# Patient Record
Sex: Male | Born: 1940 | Race: White | Hispanic: No | Marital: Married | Smoking: Never smoker
Health system: Southern US, Community
[De-identification: ages and names within clinical notes are randomized; demographics above are authoritative.]

## PROBLEM LIST (undated history)

## (undated) DIAGNOSIS — R609 Edema, unspecified: Secondary | ICD-10-CM

## (undated) DIAGNOSIS — K219 Gastro-esophageal reflux disease without esophagitis: Secondary | ICD-10-CM

## (undated) DIAGNOSIS — N400 Enlarged prostate without lower urinary tract symptoms: Secondary | ICD-10-CM

## (undated) DIAGNOSIS — R251 Tremor, unspecified: Secondary | ICD-10-CM

## (undated) DIAGNOSIS — E785 Hyperlipidemia, unspecified: Secondary | ICD-10-CM

## (undated) DIAGNOSIS — I10 Essential (primary) hypertension: Secondary | ICD-10-CM

## (undated) DIAGNOSIS — G20 Parkinson's disease: Secondary | ICD-10-CM

## (undated) DIAGNOSIS — T7840XA Allergy, unspecified, initial encounter: Secondary | ICD-10-CM

## (undated) DIAGNOSIS — G20A1 Parkinson's disease without dyskinesia, without mention of fluctuations: Secondary | ICD-10-CM

## (undated) DIAGNOSIS — N200 Calculus of kidney: Secondary | ICD-10-CM

## (undated) HISTORY — DX: Calculus of kidney: N20.0

## (undated) HISTORY — DX: Benign prostatic hyperplasia without lower urinary tract symptoms: N40.0

## (undated) HISTORY — DX: Gastro-esophageal reflux disease without esophagitis: K21.9

## (undated) HISTORY — DX: Allergy, unspecified, initial encounter: T78.40XA

## (undated) HISTORY — DX: Essential (primary) hypertension: I10

## (undated) HISTORY — DX: Tremor, unspecified: R25.1

## (undated) HISTORY — DX: Hyperlipidemia, unspecified: E78.5

## (undated) HISTORY — PX: TRANSURETHRAL RESECTION OF PROSTATE: SHX73

---

## 1998-01-04 HISTORY — PX: ESOPHAGOGASTRODUODENOSCOPY: SHX1529

## 1998-01-18 ENCOUNTER — Other Ambulatory Visit: Admission: RE | Admit: 1998-01-18 | Discharge: 1998-01-18 | Payer: Self-pay | Admitting: Gastroenterology

## 1998-02-04 HISTORY — PX: PROSTATE SURGERY: SHX751

## 1998-05-06 ENCOUNTER — Encounter: Payer: Self-pay | Admitting: Family Medicine

## 1998-05-06 LAB — CONVERTED CEMR LAB: PSA: 2.6 ng/mL

## 1999-05-06 ENCOUNTER — Encounter: Payer: Self-pay | Admitting: Family Medicine

## 2001-01-26 HISTORY — PX: CYSTOSCOPY TUMOR / CONDYLOMATA W/ LASER: SUR373

## 2001-06-23 HISTORY — PX: OTHER SURGICAL HISTORY: SHX169

## 2002-02-04 ENCOUNTER — Encounter: Payer: Self-pay | Admitting: Family Medicine

## 2002-02-04 LAB — CONVERTED CEMR LAB: PSA: 1.4 ng/mL

## 2003-02-05 ENCOUNTER — Encounter: Payer: Self-pay | Admitting: Family Medicine

## 2003-02-05 LAB — CONVERTED CEMR LAB: PSA: 0.8 ng/mL

## 2003-10-06 HISTORY — PX: LITHOTRIPSY: SUR834

## 2003-10-17 ENCOUNTER — Other Ambulatory Visit: Payer: Self-pay

## 2003-12-21 ENCOUNTER — Ambulatory Visit: Payer: Self-pay | Admitting: Family Medicine

## 2004-04-04 ENCOUNTER — Encounter: Payer: Self-pay | Admitting: Family Medicine

## 2004-04-04 LAB — CONVERTED CEMR LAB: PSA: 0.92 ng/mL

## 2004-04-05 ENCOUNTER — Ambulatory Visit: Payer: Self-pay | Admitting: Family Medicine

## 2004-04-09 ENCOUNTER — Ambulatory Visit: Payer: Self-pay | Admitting: Family Medicine

## 2004-04-24 ENCOUNTER — Ambulatory Visit: Payer: Self-pay | Admitting: Family Medicine

## 2004-09-04 ENCOUNTER — Encounter: Payer: Self-pay | Admitting: Family Medicine

## 2004-10-10 ENCOUNTER — Ambulatory Visit: Payer: Self-pay | Admitting: Family Medicine

## 2004-11-20 ENCOUNTER — Ambulatory Visit: Payer: Self-pay | Admitting: Family Medicine

## 2005-04-04 ENCOUNTER — Ambulatory Visit: Payer: Self-pay | Admitting: Family Medicine

## 2005-04-09 ENCOUNTER — Ambulatory Visit: Payer: Self-pay | Admitting: Family Medicine

## 2005-04-25 ENCOUNTER — Ambulatory Visit: Payer: Self-pay | Admitting: Family Medicine

## 2005-10-11 ENCOUNTER — Ambulatory Visit: Payer: Self-pay | Admitting: Family Medicine

## 2005-11-27 ENCOUNTER — Ambulatory Visit: Payer: Self-pay | Admitting: Family Medicine

## 2006-04-11 ENCOUNTER — Ambulatory Visit: Payer: Self-pay | Admitting: Family Medicine

## 2006-04-11 LAB — CONVERTED CEMR LAB
ALT: 28 units/L (ref 0–40)
AST: 24 units/L (ref 0–37)
Alkaline Phosphatase: 91 units/L (ref 39–117)
BUN: 19 mg/dL (ref 6–23)
Bilirubin, Direct: 0.2 mg/dL (ref 0.0–0.3)
CO2: 29 meq/L (ref 19–32)
Calcium: 9.9 mg/dL (ref 8.4–10.5)
Chloride: 105 meq/L (ref 96–112)
Cholesterol: 165 mg/dL (ref 0–200)
Creatinine, Ser: 1.1 mg/dL (ref 0.4–1.5)
Glucose, Bld: 108 mg/dL — ABNORMAL HIGH (ref 70–99)
Total Bilirubin: 0.7 mg/dL (ref 0.3–1.2)
Total Protein: 6.6 g/dL (ref 6.0–8.3)

## 2006-04-24 ENCOUNTER — Ambulatory Visit: Payer: Self-pay | Admitting: Family Medicine

## 2006-05-19 ENCOUNTER — Ambulatory Visit: Payer: Self-pay | Admitting: Family Medicine

## 2006-06-05 ENCOUNTER — Ambulatory Visit: Payer: Self-pay | Admitting: Family Medicine

## 2006-06-05 LAB — CONVERTED CEMR LAB
ALT: 27 units/L (ref 0–40)
Albumin: 4.2 g/dL (ref 3.5–5.2)
Alkaline Phosphatase: 78 units/L (ref 39–117)
Bilirubin, Direct: 0.1 mg/dL (ref 0.0–0.3)
HDL: 39.8 mg/dL (ref >39.0)
LDL Cholesterol: 79 mg/dL (ref 0–99)
VLDL: 28 mg/dL (ref 0–40)

## 2006-07-23 ENCOUNTER — Ambulatory Visit: Payer: Self-pay | Admitting: Family Medicine

## 2006-07-23 DIAGNOSIS — K208 Other esophagitis: Secondary | ICD-10-CM

## 2006-07-23 DIAGNOSIS — J309 Allergic rhinitis, unspecified: Secondary | ICD-10-CM

## 2006-07-23 DIAGNOSIS — I10 Essential (primary) hypertension: Secondary | ICD-10-CM | POA: Insufficient documentation

## 2006-07-23 DIAGNOSIS — K219 Gastro-esophageal reflux disease without esophagitis: Secondary | ICD-10-CM | POA: Insufficient documentation

## 2006-07-23 DIAGNOSIS — N200 Calculus of kidney: Secondary | ICD-10-CM | POA: Insufficient documentation

## 2006-07-23 DIAGNOSIS — N400 Enlarged prostate without lower urinary tract symptoms: Secondary | ICD-10-CM | POA: Insufficient documentation

## 2006-07-23 LAB — CONVERTED CEMR LAB
ALT: 23 units/L (ref 0–40)
AST: 22 units/L (ref 0–37)
Total CHOL/HDL Ratio: 3.6

## 2006-07-25 ENCOUNTER — Ambulatory Visit: Payer: Self-pay | Admitting: Family Medicine

## 2006-11-28 ENCOUNTER — Ambulatory Visit: Payer: Self-pay | Admitting: Family Medicine

## 2007-02-25 ENCOUNTER — Ambulatory Visit: Payer: Self-pay | Admitting: Family Medicine

## 2007-02-25 ENCOUNTER — Telehealth (INDEPENDENT_AMBULATORY_CARE_PROVIDER_SITE_OTHER): Payer: Self-pay | Admitting: Internal Medicine

## 2007-02-26 ENCOUNTER — Telehealth (INDEPENDENT_AMBULATORY_CARE_PROVIDER_SITE_OTHER): Payer: Self-pay | Admitting: Internal Medicine

## 2007-05-29 ENCOUNTER — Telehealth: Payer: Self-pay | Admitting: Family Medicine

## 2007-08-18 ENCOUNTER — Ambulatory Visit: Payer: Self-pay | Admitting: Family Medicine

## 2007-08-18 LAB — CONVERTED CEMR LAB
ALT: 24 units/L (ref 0–53)
Alkaline Phosphatase: 77 units/L (ref 39–117)
Bilirubin, Direct: 0.1 mg/dL (ref 0.0–0.3)
CO2: 27 meq/L (ref 19–32)
Cholesterol: 108 mg/dL (ref 0–200)
Glucose, Bld: 116 mg/dL — ABNORMAL HIGH (ref 70–99)
HDL: 34.9 mg/dL — ABNORMAL LOW (ref >39.0)
Potassium: 4 meq/L (ref 3.5–5.1)
Sodium: 140 meq/L (ref 135–145)
Total Protein: 6.6 g/dL (ref 6.0–8.3)

## 2007-08-20 ENCOUNTER — Ambulatory Visit: Payer: Self-pay | Admitting: Family Medicine

## 2007-08-28 ENCOUNTER — Ambulatory Visit: Payer: Self-pay | Admitting: Family Medicine

## 2007-08-28 LAB — CONVERTED CEMR LAB
OCCULT 1: NEGATIVE
OCCULT 2: NEGATIVE
OCCULT 3: NEGATIVE

## 2007-08-31 ENCOUNTER — Encounter (INDEPENDENT_AMBULATORY_CARE_PROVIDER_SITE_OTHER): Payer: Self-pay | Admitting: *Deleted

## 2007-09-11 ENCOUNTER — Telehealth (INDEPENDENT_AMBULATORY_CARE_PROVIDER_SITE_OTHER): Payer: Self-pay | Admitting: Internal Medicine

## 2007-09-19 ENCOUNTER — Telehealth: Payer: Self-pay | Admitting: Family Medicine

## 2007-11-09 ENCOUNTER — Ambulatory Visit: Payer: Self-pay | Admitting: Family Medicine

## 2008-05-23 ENCOUNTER — Telehealth: Payer: Self-pay | Admitting: Family Medicine

## 2008-05-24 ENCOUNTER — Telehealth: Payer: Self-pay | Admitting: Family Medicine

## 2008-05-24 ENCOUNTER — Encounter: Payer: Self-pay | Admitting: Family Medicine

## 2008-06-08 ENCOUNTER — Telehealth: Payer: Self-pay | Admitting: Family Medicine

## 2008-07-12 ENCOUNTER — Telehealth: Payer: Self-pay | Admitting: Family Medicine

## 2008-08-22 ENCOUNTER — Ambulatory Visit: Payer: Self-pay | Admitting: Family Medicine

## 2008-08-22 LAB — CONVERTED CEMR LAB
BUN: 19 mg/dL (ref 6–23)
Basophils Relative: 0.3 % (ref 0.0–3.0)
Bilirubin, Direct: 0.2 mg/dL (ref 0.0–0.3)
CO2: 31 meq/L (ref 19–32)
Chloride: 105 meq/L (ref 96–112)
Cholesterol: 129 mg/dL (ref 0–200)
Creatinine, Ser: 1.1 mg/dL (ref 0.4–1.5)
Creatinine,U: 130.3 mg/dL
Eosinophils Absolute: 0.4 10*3/uL (ref 0.0–0.7)
MCHC: 34.5 g/dL (ref 30.0–36.0)
MCV: 95.2 fL (ref 78.0–100.0)
Microalb, Ur: 1 mg/dL (ref 0.0–1.9)
Monocytes Absolute: 0.5 10*3/uL (ref 0.1–1.0)
Neutrophils Relative %: 49.2 % (ref 43.0–77.0)
Platelets: 194 10*3/uL (ref 150.0–400.0)
RBC: 4.42 M/uL (ref 4.22–5.81)
TSH: 4.58 microintl units/mL (ref 0.35–5.50)
Total Protein: 7.3 g/dL (ref 6.0–8.3)
Triglycerides: 89 mg/dL (ref 0.0–149.0)

## 2008-08-23 LAB — CONVERTED CEMR LAB: Vit D, 25-Hydroxy: 28 ng/mL — ABNORMAL LOW (ref 30–89)

## 2008-08-24 ENCOUNTER — Ambulatory Visit: Payer: Self-pay | Admitting: Family Medicine

## 2008-08-24 DIAGNOSIS — E559 Vitamin D deficiency, unspecified: Secondary | ICD-10-CM | POA: Insufficient documentation

## 2008-09-09 ENCOUNTER — Ambulatory Visit: Payer: Self-pay | Admitting: Family Medicine

## 2008-09-09 LAB — FECAL OCCULT BLOOD, GUAIAC: Fecal Occult Blood: NEGATIVE

## 2008-09-12 ENCOUNTER — Encounter (INDEPENDENT_AMBULATORY_CARE_PROVIDER_SITE_OTHER): Payer: Self-pay | Admitting: *Deleted

## 2008-11-17 ENCOUNTER — Ambulatory Visit: Payer: Self-pay | Admitting: Family Medicine

## 2008-12-22 ENCOUNTER — Ambulatory Visit: Payer: Self-pay | Admitting: Family Medicine

## 2008-12-22 LAB — CONVERTED CEMR LAB
ALT: 21 units/L (ref 0–53)
AST: 23 units/L (ref 0–37)
Cholesterol: 140 mg/dL (ref 0–200)
HDL: 41.9 mg/dL (ref >39.00)
Total CHOL/HDL Ratio: 3
Triglycerides: 81 mg/dL (ref 0.0–149.0)

## 2008-12-27 ENCOUNTER — Ambulatory Visit: Payer: Self-pay | Admitting: Family Medicine

## 2009-05-30 ENCOUNTER — Ambulatory Visit: Payer: Self-pay | Admitting: Family Medicine

## 2009-06-19 ENCOUNTER — Telehealth: Payer: Self-pay | Admitting: Family Medicine

## 2009-06-26 ENCOUNTER — Telehealth: Payer: Self-pay | Admitting: Family Medicine

## 2009-07-05 ENCOUNTER — Encounter: Payer: Self-pay | Admitting: Family Medicine

## 2009-07-05 ENCOUNTER — Telehealth (INDEPENDENT_AMBULATORY_CARE_PROVIDER_SITE_OTHER): Payer: Self-pay | Admitting: *Deleted

## 2009-09-07 ENCOUNTER — Encounter: Payer: Self-pay | Admitting: Family Medicine

## 2009-09-07 ENCOUNTER — Telehealth (INDEPENDENT_AMBULATORY_CARE_PROVIDER_SITE_OTHER): Payer: Self-pay | Admitting: *Deleted

## 2009-09-07 ENCOUNTER — Ambulatory Visit: Payer: Self-pay | Admitting: Family Medicine

## 2009-09-07 LAB — CONVERTED CEMR LAB
Albumin: 4.3 g/dL (ref 3.5–5.2)
BUN: 20 mg/dL (ref 6–23)
CO2: 29 meq/L (ref 19–32)
Calcium: 9.8 mg/dL (ref 8.4–10.5)
Creatinine, Ser: 1.1 mg/dL (ref 0.4–1.5)
Glucose, Bld: 108 mg/dL — ABNORMAL HIGH (ref 70–99)
HDL: 45.4 mg/dL (ref >39.00)
Total Protein: 6.9 g/dL (ref 6.0–8.3)
Triglycerides: 97 mg/dL (ref 0.0–149.0)

## 2009-09-08 ENCOUNTER — Encounter: Payer: Self-pay | Admitting: Family Medicine

## 2009-09-11 ENCOUNTER — Encounter: Payer: Self-pay | Admitting: Family Medicine

## 2009-09-11 ENCOUNTER — Encounter (INDEPENDENT_AMBULATORY_CARE_PROVIDER_SITE_OTHER): Payer: Self-pay | Admitting: *Deleted

## 2009-09-11 ENCOUNTER — Ambulatory Visit: Payer: Self-pay | Admitting: Family Medicine

## 2009-09-14 ENCOUNTER — Encounter: Payer: Self-pay | Admitting: Family Medicine

## 2009-09-14 ENCOUNTER — Telehealth: Payer: Self-pay | Admitting: Family Medicine

## 2009-09-18 ENCOUNTER — Ambulatory Visit: Payer: Self-pay | Admitting: Family Medicine

## 2009-09-20 ENCOUNTER — Encounter (INDEPENDENT_AMBULATORY_CARE_PROVIDER_SITE_OTHER): Payer: Self-pay | Admitting: *Deleted

## 2009-09-20 LAB — CONVERTED CEMR LAB: Fecal Occult Bld: NEGATIVE

## 2009-10-31 ENCOUNTER — Ambulatory Visit: Payer: Self-pay | Admitting: Family Medicine

## 2009-11-21 ENCOUNTER — Encounter: Payer: Self-pay | Admitting: Family Medicine

## 2009-12-20 ENCOUNTER — Telehealth: Payer: Self-pay | Admitting: Family Medicine

## 2009-12-21 ENCOUNTER — Encounter: Payer: Self-pay | Admitting: Family Medicine

## 2010-03-06 NOTE — Medication Information (Signed)
Summary: [employer] Prior Auth Approval for Omeprazole Capsule Dr from 06/14/09  [employer] Prior Auth Approval for Omeprazole Capsule Dr from 06/14/09-01/01/10   Imported By: Beau Fanny 07/07/2009 14:00:30  _____________________________________________________________________  External Attachment:    Type:   Image     Comment:   External Document

## 2010-03-06 NOTE — Letter (Signed)
Summary: [facility]   Imported By: Maryln Gottron 12/06/2009 12:29:46  _____________________________________________________________________  External Attachment:    Type:   Image     Comment:   External Document  Appended Document: [facility]    Clinical Lists Changes  Observations: Added new observation of PAST MED HX: Allergic rhinitis Hypertension GERD tremor HLD BPH, no h/o prostate CA- followed by Dr. Evelene Croon (12/06/2009 13:12)       Past History:  Past Medical History: Allergic rhinitis Hypertension GERD tremor HLD BPH, no h/o prostate CA- followed by Dr. Evelene Croon

## 2010-03-06 NOTE — Progress Notes (Signed)
Summary: Prior authorization for Prilosec 40 mg.  Phone Note Call from Patient Call back at Home Phone [phone]   Caller: Patient Call For: Shaune Leeks MD Summary of Call: Patient states that he has been informed that his Prilosec 40 mg. requires a prior authorization.  He says he was given this number for [place] to call.  [phone].  I will call for the proper forms to be faxed. Initial call taken by: Delilah Shan CMA Duncan Dull),  Jun 26, 2009 10:20 AM  Follow-up for Phone Call        Called to request form. Follow-up by: Delilah Shan CMA Duncan Dull),  Jun 27, 2009 12:40 PM

## 2010-03-06 NOTE — Letter (Signed)
Summary: Results Follow up Letter  [place] at [facility]  [address] [place], [place] [address]   Phone: [phone]  Fax: [phone]    09/20/2009 MRN: [id number]    Hunter Willis [address] [place], [place]  [address]    Dear Mr. Corsetti,  The following are the results of your recent test(s):  Test         Result    Pap Smear:        Normal _____  Not Normal _____ Comments: ______________________________________________________ Cholesterol: LDL(Bad cholesterol):         Your goal is less than:         HDL (Good cholesterol):       Your goal is more than: Comments:  ______________________________________________________ Mammogram:        Normal _____  Not Normal _____ Comments:  ___________________________________________________________________ Hemoccult:        Normal __X__  Not normal _______ Comments:    _____________________________________________________________________ Other Tests:    We routinely do not discuss normal results over the telephone.  If you desire a copy of the results, or you have any questions about this information we can discuss them at your next office visit.   Sincerely,    Dwana Curd. Para March, M.D.  [facility]
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
## 2010-03-06 NOTE — Assessment & Plan Note (Signed)
Summary: CPX/TRANSFER FROM DR SCHALLER/CLE   Vital Signs:  Patient profile:   70 year old male Height:      75 inches Weight:      189.25 pounds BMI:     23.74 Temp:     97.9 degrees F oral Pulse rate:   72 / minute Pulse rhythm:   regular BP sitting:   132 / 82  (left arm) Cuff size:   regular  Vitals Entered By: Delilah Shan CMA Duncan Dull) 2009/10/05 11:29 AM) CC: CPX - Transfer from [facility] RN  History of Present Illness: Hypertension:      Using medication without problems or lightheadedness: yes Chest pain with exertion:no Edema:no Short of breath:no Other issues:no  Tremor noted 5 years ago.  On betablocker.  No help with topiramate.  Has had neuro follow up. Taking klonopin three times a day with some relief.  No recent change in symptoms.   Elevated Cholesterol: Using medications without problems:yes Muscle aches: no Other complaints: no  Labs reviewed with patient.  Mild increase in glucose.  No h/o DM in patient but there is a FH.    Screening for prostate CA.  Rectal exam done by Uro.  PSA not elevated.  Current Medications (verified): 1)  Atenolol 25 Mg Tabs (Atenolol) .... Take One By Mouth  Two Times A Day 2)  Simvastatin 80 Mg Tabs (Simvastatin) .... Take 1 Tablet By Mouth At Bedtime 3)  Amlodipine Besylate 5 Mg Tabs (Amlodipine Besylate) .... Take 1 Tablet By Mouth Once A Day 4)  Prilosec 40 Mg  Cpdr (Omeprazole) .... One Tab By Mouth 45 Mins Before Brfst. 5)  Zyrtec Allergy 10 Mg  Tabs (Cetirizine Hcl) .... Take 1 Tablet By Mouth Once A Day 6)  Fish Oil 1000 Mg Caps (Omega-3 Fatty Acids) .... 2 Capsules Daily By Mouth 7)  Ergocalciferol 50000 Unit Caps (Ergocalciferol) .... Take 1 Capsule By Mouth One Day Per Week For 8 Weeks.  Allergies: No Known Drug Allergies  Past History:  Family History: Last updated: 05-Oct-2009 Father: Died at age 20, unknow causes, did have h/o MI Mother: Died at age 72 with diabetes of natural causes Siblings:Five  brothers, 1 deceased at age 46 with terminal cancer of the prostate             (metastatic disease), 3 brothers with HTN, and 1 other brother has lost his             sense of smell.             Four sisters, 1 with diabetes and syncope with a broken hip, 1 with             depression and 1 who has not had polyps now, one with throat cancer, has improved some but lost a lot of weight..  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 Artis Flock  Past Surgical History: EGD- erosive esoph 12/99 Gerd 12/99 Tuna laser Cystoscopy 01/26/01 EMG, NCV LE's wnl Kemper Durie) 06/23/01 EGD, wnl R/O H.Plyorie, gastritis by bx 09/28/02 Lithotripsy secondary kidney stone Artis Flock) 09/05 Prostatic microwave therapy 10/07   Family History: Reviewed history from 08/24/2008 and no changes required. Father: Died at age 44, unknow causes, did have h/o MI Mother: Died at  age 50 with diabetes of natural causes Siblings:Five brothers, 1 deceased at age 25 with terminal cancer of the prostate             (metastatic disease), 3 brothers with HTN, and 1 other brother has lost his             sense of smell.             Four sisters, 1 with diabetes and syncope with a broken hip, 1 with             depression and 1 who has not had polyps now, one with throat cancer, has improved some but lost a lot of weight..  Social History: Reviewed history from 07/23/2006 and no changes required. 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  Otherwise negative.    Physical Exam  General:  GEN: nad, alert and oriented HEENT:  mucous membranes moist NECK: supple w/o LA CV: regular rate and rhythm  PULM: ctab, no inc wob ABD: soft, +bs EXT: no edema SKIN: no acute rash  No cogwheeling.  L>R tremor, arm > leg tremor.   ~4Hz .  CN 2-12 wnl B, S/S/DTR wnl x4    Impression & Recommendations:  Problem # 1:  UNSPECIFIED VITAMIN D DEFICIENCY (ICD-268.9) continue repletion and return for recheck on lab.   Problem # 2:  HYPERGLYCEMIA, 106 (ICD-790.6) D/w patient.  Avoid sugar foods and check yearly.   Problem # 3:  TREMOR (PHYSIOLOGIC) WILLIS (ICD-781.0) No change in meds.  No chnage in symptoms.  No cogwheeling and no symptoms suggestive of Parkinson's o/w.   Problem # 4:  HYPERTENSION (ICD-401.9) No change in meds.  labs reviwed with patient.  His updated medication list for this problem includes:    Atenolol 25 Mg Tabs (Atenolol) .Marland Kitchen... Take one by mouth  two times a day    Amlodipine Besylate 5 Mg Tabs (Amlodipine besylate) .Marland Kitchen... Take 1 tablet by mouth once a day  Problem # 5:  HYPERCHOLESTEROLEMIA, 243/LDL 168 (ICD-272.0) Controlled.  His updated medication list for this problem includes:    Simvastatin 80 Mg Tabs (Simvastatin) .Marland Kitchen... Take 1 tablet by mouth at bedtime  Problem # 6:  SPECIAL SCREENING MALIG NEOPLASMS OTHER SITES (ICD-V76.49) D/w patient [id number] vs. IFOB.  He elects for IFOB.  Will await return of sample.   Complete Medication List: 1)  Atenolol 25 Mg Tabs (Atenolol) .... Take one by mouth  two times a day 2)  Simvastatin 80 Mg Tabs (Simvastatin) .... Take 1 tablet by mouth at bedtime 3)  Amlodipine Besylate 5 Mg Tabs (Amlodipine besylate) .... Take 1 tablet by mouth once a day 4)  Prilosec 40 Mg Cpdr (Omeprazole) .... One tab by mouth 45 mins before brfst. 5)  Zyrtec Allergy 10 Mg Tabs (Cetirizine hcl) .... Take 1 tablet by mouth once a day 6)  Fish Oil 1000 Mg Caps (Omega-3 fatty acids) .... 2 capsules daily by mouth 7)  Ergocalciferol 50000 Unit Caps (Ergocalciferol) .... Take 1  capsule by mouth one day per week for 8 weeks. 8)  Klonopin 0.5 Mg Tabs (Clonazepam) .Marland Kitchen.. 1 by mouth three times a day for tremor  Other Orders: Pneumococcal Vaccine (11914) Admin 1st Vaccine (78295)  Patient Instructions: 1)  Check with your insurance to see if they will cover the shingles  shot.  Drop of the stool blood sample.  We'll contact you with the results.  Prescriptions: PRILOSEC 40 MG  CPDR (OMEPRAZOLE) one tab by mouth 45 mins before brfst.  #90 x 3   Entered and Authorized by:   [name] MD   Signed by:   [name] MD on 09/11/2009   Method used:   Electronically to        [facility] (retail)             ,          Ph: [phone]       Fax: [phone]   RxID:   [id number] AMLODIPINE BESYLATE 5 MG TABS (AMLODIPINE BESYLATE) Take 1 tablet by mouth once a day  #90 x 4   Entered and Authorized by:   [name] MD   Signed by:   [name] MD on 09/11/2009   Method used:   Electronically to        [facility] (retail)             ,          Ph: [phone]       Fax: [phone]   RxID:   [id number] SIMVASTATIN 80 MG TABS (SIMVASTATIN) Take 1 tablet by mouth at bedtime  #90 x 4   Entered and Authorized by:   [name] MD   Signed by:   [name] MD on 09/11/2009   Method used:   Electronically to        [facility] (retail)             ,          Ph: [phone]       Fax: [phone]   RxID:   [id number] ATENOLOL 25 MG TABS (ATENOLOL) Take one by mouth  two times a day  #180 x 4   Entered and Authorized by:   [name] MD   Signed by:   [name] MD on 09/11/2009   Method used:   Electronically to        [facility]* (retail)             ,          Ph: [phone]       Fax: [phone]   RxID:   [id number]   Current Allergies (reviewed today): No known allergies   Immunizations Administered:  Pneumonia Vaccine:    Vaccine Type: Pneumovax (Medicare)    Site: right deltoid     Mfr: Merck    Dose: 0.5 ml    Route: IM    Given by: Delilah Shan CMA (AAMA)    Exp. Date: 02/21/2011    Lot #: [id number]    VIS given: 09/02/95 version given September 11, 2009.

## 2010-03-06 NOTE — Letter (Signed)
Summary: [facility]: Immunoassay Fecal Occult Blood (iFOB) Order Form  [place] at [facility]  [address] [place], [place] [address]   Phone: [phone]  Fax: [phone]      [facility]: Immunoassay Fecal Occult Blood (iFOB) Order Form   September 11, 2009 MRN: [id number]   Hunter Willis [birth year]   Physicican Name:_________duncan________________  Diagnosis Code:_________v76.49_________________      [name] MD

## 2010-03-06 NOTE — Miscellaneous (Signed)
  Clinical Lists Changes  Medications: Added new medication of ERGOCALCIFEROL 50000 UNIT CAPS (ERGOCALCIFEROL) Take 1 capsule by mouth one day per week for 8 weeks.

## 2010-03-06 NOTE — Medication Information (Signed)
Summary: Interaction with Simvastatin & Amlodipine/[facility]  Interaction with Simvastatin & Amlodipine/[facility]   Imported By: Lanelle Bal 09/21/2009 13:09:11  _____________________________________________________________________  External Attachment:    Type:   Image     Comment:   External Document

## 2010-03-06 NOTE — Medication Information (Signed)
Summary: Prior Authorization & Approval for Additional Quantity Omeprazol  Prior Authorization & Approval for Additional Quantity Omeprazole/[facility]   Imported By: Lanelle Bal 01/04/2010 09:20:43  _____________________________________________________________________  External Attachment:    Type:   Image     Comment:   External Document

## 2010-03-06 NOTE — Progress Notes (Signed)
Summary: prior auth given for prilosec  Phone Note Outgoing Call   Call placed to: [facility] Summary of Call: Prior auth for prilosec approved over the phone from [facility].             Lowella Petties CMA  July 05, 2009 10:03 AM

## 2010-03-06 NOTE — Letter (Signed)
Summary: Nadara Eaton letter  [place] at [facility]  [address] [place], [place] [address]   Phone: [phone]  Fax: [phone]       09/11/2009 MRN: [id number]  Hunter Willis [address] [place], [place]  [address]  Dear Mr. [facility] [place] and [place] announce the retirement of Arta Silence, M.D., from full-time practice at the [facility] office effective August 03, 2009 and his plans of returning part-time.  It is important to Dr. Hetty Ely and to our practice that you understand that [facility] has seven physicians in our office for your health care needs.  We will continue to offer the same exceptional care that you have today.    Dr. Hetty Ely has spoken to many of you about his plans for retirement and returning part-time in the fall.   We will continue to work with you through the transition to schedule appointments for you in the office and meet the high standards that [place] is committed to.   Again, it is with great pleasure that we share the news that Dr. Hetty Ely will return to [facility] in October of 2011 with a reduced schedule.    If you have any questions, or would like to request an appointment with one of our physicians, please call us at [phone] and press the option for Scheduling an appointment.  We take pleasure in providing you with excellent patient care and look forward to seeing you at your next office visit.  Our [facility] Physicians are:  Tillman Abide, M.D. Laurita Quint, M.D. Roxy Manns, M.D. Kerby Nora, M.D. Hannah Beat, M.D. Ruthe Mannan, M.D. We proudly welcomed Raechel Ache, M.D. and Eustaquio Boyden, M.D. to the practice in July/August 2011.  Sincerely,  [place] [facility]

## 2010-03-06 NOTE — Assessment & Plan Note (Signed)
Summary: flu/alc  Nurse Visit   Allergies: No Known Drug Allergies  Immunizations Administered:  Influenza Vaccine # 1:    Vaccine Type: Fluvax MCR    Site: left deltoid    Mfr: GlaxoSmithKline    Dose: 0.5 ml    Route: IM    Given by: Mervin Hack CMA (AAMA)    Exp. Date: 08/04/2010    Lot #: [id number]    VIS given: 08/29/09 version given October 31, 2009.  Flu Vaccine Consent Questions:    Do you have a history of severe allergic reactions to this vaccine? no    Any prior history of allergic reactions to egg and/or gelatin? no    Do you have a sensitivity to the preservative Thimersol? no    Do you have a past history of Guillan-Barre Syndrome? no    Do you currently have an acute febrile illness? no    Have you ever had a severe reaction to latex? no    Vaccine information given and explained to patient? yes  Orders Added: 1)  Influenza Vaccine MCR [00025]

## 2010-03-06 NOTE — Progress Notes (Signed)
Summary: regarding drug interaction  Phone Note From Pharmacy   Caller: [facility] Summary of Call: Letter regarding drug interaction between zocor and amlodipine is on your desk.                Lowella Petties CMA  September 14, 2009 9:50 AM    Follow-up for Phone Call        signed, please send back.  Follow-up by: [name] MD,  September 14, 2009 10:40 PM  Additional Follow-up for Phone Call Additional follow up Details #1::        Faxed. Additional Follow-up by: Delilah Shan CMA Duncan Dull),  September 15, 2009 12:50 PM

## 2010-03-06 NOTE — Progress Notes (Signed)
Summary: prior auth needed for omeprazole  Phone Note From Pharmacy   Caller: [facility] Summary of Call: Prior auth is needed for omeprazole, form is on your desk. Initial call taken by: Lowella Petties CMA, AAMA,  December 20, 2009 10:24 AM  Follow-up for Phone Call        done, in my outbox.  Follow-up by: [name] MD,  December 20, 2009 1:23 PM  Additional Follow-up for Phone Call Additional follow up Details #1::        Form faxed.                Lowella Petties CMA, AAMA  December 20, 2009 3:37 PM  Approval letter placed on doctor's desk for signature and scanning. Additional Follow-up by: Lowella Petties CMA, AAMA,  December 22, 2009 12:10 PM

## 2010-03-06 NOTE — Progress Notes (Signed)
Summary: Medication refill Prilosec  Phone Note Call from Patient   Caller: Patient Call For: Shaune Leeks MD Summary of Call: Request Medication refill thru [facility] for Prolosec 40 mg.Daine Gip  Jun 19, 2009 1:36 PM  Initial call taken by: Daine Gip,  Jun 19, 2009 1:36 PM  Follow-up for Phone Call        Rx sent to pharmacy electronically. Follow-up by: Sydell Axon LPN,  Jun 19, 2009 1:51 PM    Prescriptions: PRILOSEC 40 MG  CPDR (OMEPRAZOLE) one tab by mouth 45 mins before brfst.  #90 x 3   Entered by:   Sydell Axon LPN   Authorized by:   Shaune Leeks MD   Signed by:   Sydell Axon LPN on 16/11/9602   Method used:   Electronically to        [facility]* (mail-order)             ,          Ph: [phone]       Fax: [phone]   RxID:   [id number]

## 2010-03-06 NOTE — Progress Notes (Signed)
----   Converted from flag ---- ---- 09/06/2009 9:05 PM, [name] MD wrote: cmet/lipid 401.1 vit d 268.9 PSA v13.8  ---- 09/06/2009 3:08 PM, Mills Koller wrote: Patient is scheduled for a CPX with you, I need lab orders with DX,  Thanks, Terri ------------------------------

## 2010-03-16 ENCOUNTER — Encounter: Payer: Self-pay | Admitting: Family Medicine

## 2010-03-16 ENCOUNTER — Ambulatory Visit (INDEPENDENT_AMBULATORY_CARE_PROVIDER_SITE_OTHER): Payer: 59 | Admitting: Family Medicine

## 2010-03-16 DIAGNOSIS — H6980 Other specified disorders of Eustachian tube, unspecified ear: Secondary | ICD-10-CM

## 2010-03-16 DIAGNOSIS — E559 Vitamin D deficiency, unspecified: Secondary | ICD-10-CM

## 2010-03-19 ENCOUNTER — Encounter: Payer: Self-pay | Admitting: Family Medicine

## 2010-03-19 LAB — CONVERTED CEMR LAB: Vit D, 25-Hydroxy: 52 ng/mL (ref 30–89)

## 2010-03-28 NOTE — Miscellaneous (Signed)
  Clinical Lists Changes  Medications: Added new medication of VITAMIN D 1000 UNIT  TABS (CHOLECALCIFEROL) Take 1 tablet by mouth once a day

## 2010-03-28 NOTE — Assessment & Plan Note (Signed)
Summary: LEFT EAR/CLE    MEDICARE/UHC   Vital Signs:  Patient profile:   70 year old male Height:      75 inches Weight:      185.25 pounds BMI:     23.24 Temp:     98.3 degrees F oral Pulse rate:   72 / minute Pulse rhythm:   regular BP sitting:   132 / 84  (left arm) Cuff size:   regular  Vitals Entered By: Delilah Shan CMA Roth Ress Dull) (March 16, 2010 2:21 PM) CC: Left ear   History of Present Illness: L ear feels clogged.  Going on for about 1 month.  He can hear pulse in his ear.  No fevers.  Has noted smell changes.  No R ear pain. Some L TMJ area pain, intermittent.  No ST.   resting tremor noted.    He is due for vitamin D recheck.  See notes on labs.   Allergies: No Known Drug Allergies  Past History:  Past Medical History: Last updated: 12/06/2009 Allergic rhinitis Hypertension GERD tremor HLD BPH, no h/o prostate CA- followed by Dr. Evelene Croon  Review of Systems       See HPI.  Otherwise negative.    Physical Exam  General:  GEN: nad, alert and oriented HEENT: mucous membranes moist, tm w/o erythema, nasal exam with some clear rhinorrhea and minimal injection, no OP erythema NECK: supple w/o LA CV: regular rate and rhythm  PULM: ctab, no inc wob EXT: no edema SKIN: no acute rash  resting tremor noted   Impression & Recommendations:  Problem # 1:  UNSPECIFIED VITAMIN D DEFICIENCY (ICD-268.9) See notes on labs.  Orders: T- * Misc. 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  He agrees and will call back as needed.  There is no need to start antibiotics today. He understood.  d/w patient [id number]  Orders: Prescription Created Electronically [id number])  Complete Medication List: 1)  Atenolol 25 Mg Tabs (Atenolol) .... Take one by mouth  two times a day 2)  Simvastatin 40 Mg Tabs (Simvastatin) .Marland Kitchen.. 1 by mouth once daily 3)   Amlodipine Besylate 5 Mg Tabs (Amlodipine besylate) .... Take 1 tablet by mouth once a day 4)  Prilosec 40 Mg Cpdr (Omeprazole) .... One tab by mouth 45 mins before brfst. 5)  Zyrtec Allergy 10 Mg Tabs (Cetirizine hcl) .... Take 1 tablet by mouth once a day 6)  Fish Oil 1000 Mg Caps (Omega-3 fatty acids) .... 2 capsules daily by mouth 7)  Klonopin 0.5 Mg Tabs (Clonazepam) .... (weaning) 1 by mouth three times a day for tremor 8)  Primidone 250 Mg Tabs (Primidone) .... ? mg. 9)  Axiron 30 Mg/act Soln (Testosterone) 10)  Flonase 50 Mcg/act Susp (Fluticasone propionate) .... 2 sprays per nostril per day.  Patient Instructions: 1)  Use the flonase- 2 sprays in each nostril each day and then let me know if you aren't improving.   2)  You can get your results through our phone system.  Follow the instructions on the blue card.  3)  Take care.  Prescriptions: SIMVASTATIN 40 MG TABS (SIMVASTATIN) 1 by mouth once daily  #90 x 3   Entered and Authorized by:   [name] MD   Signed by:   [name] MD on 03/16/2010   Method used:   Electronically to  [facility]* (retail)             ,          Ph: [phone]       Fax: [phone]   RxID:   [id number] FLONASE 50 MCG/ACT SUSP (FLUTICASONE PROPIONATE) 2 sprays per nostril per day.  #1 x 3   Entered and Authorized by:   [name] MD   Signed by:   [name] MD on 03/16/2010   Method used:   Electronically to        [facility]* (retail)       [address]       [place], [place]  [address]       Ph: [phone]       Fax: [phone]   RxID:   [id number]    Orders Added: 1)  Est. Patient Level III [84166] 2)  T- * Misc. Laboratory test [id number] 3)  Prescription Created Electronically [phone]    Current Allergies (reviewed today): No known allergies

## 2010-03-30 ENCOUNTER — Ambulatory Visit (INDEPENDENT_AMBULATORY_CARE_PROVIDER_SITE_OTHER): Payer: 59 | Admitting: Family Medicine

## 2010-03-30 ENCOUNTER — Encounter: Payer: Self-pay | Admitting: Family Medicine

## 2010-03-30 DIAGNOSIS — H6980 Other specified disorders of Eustachian tube, unspecified ear: Secondary | ICD-10-CM

## 2010-04-03 NOTE — Assessment & Plan Note (Signed)
Summary: URI ???   Vital Signs:  Patient profile:   70 year old male Height:      75 inches Weight:      180.75 pounds BMI:     22.67 Temp:     98.6 degrees F oral Pulse rate:   72 / minute Pulse rhythm:   regular BP sitting:   140 / 70  (left arm) Cuff size:   regular  Vitals Entered By: Delilah Shan CMA Ellenore Roscoe Dull) (March 30, 2010 3:15 PM) CC: ? URI   History of Present Illness: Smell changes are some better.  No fevers.  Minimal cough from post nasal gtt.  Has not been on antibiotics.  Prev note reviewed.  Still can feel pulse and extra noise ('an echo') in L ear.  Tremor continues. Still with rhinorrhea.   Allergies: No Known Drug Allergies  Review of Systems       See HPI.  Otherwise negative.    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  He has a persistent L SOM likely related to the ETD.  He may have a concurrent sinusitis.  Since it hasn't resolved yet, I would start the antibiotics.  He'll call back as needed.    Orders: Prescription Created Electronically [phone])  Complete Medication List: 1)  Atenolol 25 Mg Tabs (Atenolol) .... Take one by mouth  two times a day 2)  Simvastatin 40 Mg Tabs (Simvastatin) .Marland Kitchen.. 1 by mouth once daily 3)  Amlodipine Besylate 5 Mg Tabs (Amlodipine besylate) .... Take 1 tablet by mouth once a day 4)  Prilosec 40 Mg Cpdr (Omeprazole) .... One tab by mouth 45 mins before brfst. 5)  Zyrtec Allergy 10 Mg Tabs (Cetirizine hcl) .... Take 1 tablet by mouth once a  day 6)  Fish Oil 1000 Mg Caps (Omega-3 fatty acids) .... 2 capsules daily by mouth 7)  Klonopin 0.5 Mg Tabs (Clonazepam) .... (weaning) 1 by mouth three times a day for tremor 8)  Primidone 250 Mg Tabs (Primidone) .... ? mg. 9)  Axiron 30 Mg/act Soln (Testosterone) 10)  Flonase 50 Mcg/act Susp (Fluticasone propionate) .... 2 sprays per nostril per day. 11)  Vitamin D 1000 Unit Tabs (Cholecalciferol) .... Take 1 tablet by mouth once a day 12)  Amoxicillin 875 Mg Tabs (Amoxicillin) .Marland Kitchen.. 1 by mouth  two times a day 13)  Tessalon 200 Mg Caps (Benzonatate) .Marland Kitchen.. 1 by mouth three times a day as needed for cough  Patient Instructions: 1)  Continue the flonase and start the antibiotics.  Use the tessalon if you need it for cough.  Let me know if you aren't getting better.  We may have to get you to see the ENT clinic.  Take care.  Prescriptions: TESSALON 200 MG CAPS (BENZONATATE) 1 by mouth three times a day as needed for cough  #30 x 0   Entered and Authorized by:   [name] MD   Signed by:   [name] MD on 03/30/2010   Method used:  Print then Give to Patient   RxID:   [id number] AMOXICILLIN 875 MG TABS (AMOXICILLIN) 1 by mouth  two times a day  #20 x 0   Entered and Authorized by:   [name] MD   Signed by:   [name] MD on 03/30/2010   Method used:   Electronically to        [facility]* (retail)       [address]       [place], [place]  [address]       Ph: [phone]       Fax: [phone]   RxID:   [id number]    Orders Added: 1)  Est. Patient Level III [87564] 2)  Prescription Created Electronically [id number]    Current Allergies (reviewed today): No known allergies   Appended Document: URI ???    Clinical Lists Changes  Medications: Removed medication of KLONOPIN 0.5 MG TABS (CLONAZEPAM) (weaning) 1 by mouth three times a day for tremor Changed medication from PRIMIDONE 250 MG TABS (PRIMIDONE) ? mg. to PRIMIDONE 50 MG TABS  (PRIMIDONE) 1 by mouth two times a day         Allergies: No Known Drug Allergies

## 2010-04-23 ENCOUNTER — Telehealth: Payer: Self-pay | Admitting: Family Medicine

## 2010-05-03 NOTE — Progress Notes (Signed)
Summary: wants antibiotic for ear  Phone Note Call from Patient Call back at Home Phone [phone]   Caller: Patient Call For: [name] MD Summary of Call: Pt was seen about a month ago for a sinus infection and says that he still has nausea every morning- he thinks this is being caused by eustachian tube infammation.  He is also having problems breathing out of that ear.  He is asking if antibiotic can be called in to [facility].  He doesnt want to come back in for recheck, and he doesnt want to be referred.  He is not having any fevers. Please advise. Initial call taken by: Lowella Petties CMA, AAMA,  April 23, 2010 4:44 PM  Follow-up for Phone Call        I don't think it is reasonable to rx another round of antibiotics w/o patient being seen.  the plan was to send him to ENT if not improved.  I think that is the best option at this point.  I didn't rx anything new.  please notify patient.  I'll put in the ENT referral if he'll let me.  thanks.  [name] MD  April 23, 2010 5:17 PM   Patient Advised.   He really does not want to go to ENT.  He is asking if it could be flushed out here and if that would maybe help it.  He says it seemed to get worse after he used an OTC ear flushing kit.  He says he will go to an ENT in [place] if that is all he can do.  Delilah Shan CMA Hunter Willis)  April 23, 2010 5:36 PM   Additional Follow-up for Phone Call Additional follow up Details #1::        If he has eustacian tube dysfxn, then flushing it out won't change it.  I would follow up with ENT.  That is likely the best option.  I put in the referral.   Additional Follow-up by: [name] MD,  April 23, 2010 5:46 PM

## 2010-05-21 ENCOUNTER — Encounter: Payer: Self-pay | Admitting: Family Medicine

## 2010-05-21 DIAGNOSIS — G20 Parkinson's disease: Secondary | ICD-10-CM | POA: Insufficient documentation

## 2010-05-30 ENCOUNTER — Telehealth: Payer: Self-pay | Admitting: *Deleted

## 2010-05-30 NOTE — Telephone Encounter (Signed)
I will address the hard copy.  

## 2010-05-30 NOTE — Telephone Encounter (Signed)
Prior Hunter Willis is needed for omeprazole, his current prior Hunter Willis will be expiring.  Form is on your desk.

## 2010-09-04 ENCOUNTER — Telehealth: Payer: Self-pay | Admitting: *Deleted

## 2010-09-04 DIAGNOSIS — R251 Tremor, unspecified: Secondary | ICD-10-CM

## 2010-09-04 NOTE — Telephone Encounter (Signed)
Done

## 2010-09-04 NOTE — Telephone Encounter (Signed)
Patient is currently a patient of Dr. Anne Hahn at [facility] and says that it is too far for him to drive and hates getting in the all of the traffic in [place]. He is asking if you could do referral to Dr. Sherryll Burger with [facility] urology because it is closer and more convenient. Please advise.

## 2010-10-15 ENCOUNTER — Other Ambulatory Visit: Payer: Self-pay | Admitting: Family Medicine

## 2010-11-08 ENCOUNTER — Telehealth: Payer: Self-pay | Admitting: *Deleted

## 2010-11-08 ENCOUNTER — Ambulatory Visit: Payer: 59

## 2010-11-08 NOTE — Telephone Encounter (Signed)
Will address hard copy.  

## 2010-11-08 NOTE — Telephone Encounter (Signed)
Prior [name] is needed for omeprazole, form is on your desk.  Hunter Willis has auth in place now, that will expire on 10/23.

## 2010-11-12 NOTE — Telephone Encounter (Signed)
Prior auth given for omeprazole, approval letter placed on doctor's desk for signature and scanning. 

## 2010-12-24 ENCOUNTER — Other Ambulatory Visit: Payer: Self-pay | Admitting: Family Medicine

## 2010-12-27 ENCOUNTER — Other Ambulatory Visit: Payer: Self-pay | Admitting: Family Medicine

## 2011-01-02 ENCOUNTER — Encounter: Payer: Self-pay | Admitting: Family Medicine

## 2011-01-03 ENCOUNTER — Encounter: Payer: Self-pay | Admitting: Family Medicine

## 2011-01-03 ENCOUNTER — Ambulatory Visit (INDEPENDENT_AMBULATORY_CARE_PROVIDER_SITE_OTHER): Payer: 59 | Admitting: Family Medicine

## 2011-01-03 DIAGNOSIS — M549 Dorsalgia, unspecified: Secondary | ICD-10-CM

## 2011-01-03 DIAGNOSIS — K208 Other esophagitis without bleeding: Secondary | ICD-10-CM

## 2011-01-03 NOTE — Patient Instructions (Addendum)
I would sleep with a pillow between your knees and see if that helps.  I think this will gradually get better.   I think it is reasonable to ask the GI clinic about the omeprazole dose.

## 2011-01-03 NOTE — Progress Notes (Signed)
He's been seen at [facility] and there is an ongoing up-titration of the meds for tremor.  He doesn't know if he'll be a candidate for brain stimulator.   Back pain.  Woke up 4 weeks ago with L mid/lower back pain, lateral and interior to ribs.  It gets better quickly when he gets out of bed. It doesn't go down into the legs.  No rash.  No fall, trauma, trigger. It's getting some better overall.   GERD- he's having more throat clearing.  He was asking about his PPI dose.    Meds, vitals, and allergies reviewed.   ROS: See HPI.  Otherwise, noncontributory.  nad ncat rrr ctab Tremor noted Back w/o midline pain No rash noted on back L lower back w/o paraspinal tenderness Minimal discomfort with facet loading Flexion at waist w/o pain abd benign

## 2011-01-04 ENCOUNTER — Encounter: Payer: Self-pay | Admitting: Family Medicine

## 2011-01-04 DIAGNOSIS — M549 Dorsalgia, unspecified: Secondary | ICD-10-CM | POA: Insufficient documentation

## 2011-01-04 NOTE — Assessment & Plan Note (Signed)
I asked him to talk to GI about this.

## 2011-01-04 NOTE — Assessment & Plan Note (Signed)
Overall improved, this is likely a position/postural issue.  I would adjust nocturnal posture, ie put a pillow between the knees and see if this doesn't improve.  I would not image or change/add meds at this point.  Benign exam.  D/w pt and he agrees, f/u prn.

## 2011-01-07 ENCOUNTER — Telehealth: Payer: Self-pay | Admitting: Internal Medicine

## 2011-01-07 NOTE — Telephone Encounter (Signed)
Patient called and stated Dr. Ardyth Man put him on Lodosyn to help with his nausea while taking sinemet and it's not working and wanted to know if you could help him and change it to something that could help with nausea.

## 2011-01-08 NOTE — Telephone Encounter (Signed)
The med changes need to come through Dr. Ardyth Man.  That would be safer than having mult docs change meds. I would have him notify Dr. Earlie Server clinic.

## 2011-01-09 NOTE — Telephone Encounter (Signed)
That would be Dr. Sherryll Burger.

## 2011-01-09 NOTE — Telephone Encounter (Signed)
Patient states that Dr. Clelia Croft (?) did it and it is already worked out.

## 2011-03-22 ENCOUNTER — Other Ambulatory Visit: Payer: Self-pay | Admitting: Family Medicine

## 2011-03-25 ENCOUNTER — Other Ambulatory Visit: Payer: Self-pay | Admitting: Family Medicine

## 2011-05-17 ENCOUNTER — Other Ambulatory Visit: Payer: Self-pay | Admitting: Family Medicine

## 2011-05-17 DIAGNOSIS — I10 Essential (primary) hypertension: Secondary | ICD-10-CM

## 2011-05-17 DIAGNOSIS — N400 Enlarged prostate without lower urinary tract symptoms: Secondary | ICD-10-CM

## 2011-05-21 ENCOUNTER — Other Ambulatory Visit (INDEPENDENT_AMBULATORY_CARE_PROVIDER_SITE_OTHER): Payer: 59

## 2011-05-21 DIAGNOSIS — I10 Essential (primary) hypertension: Secondary | ICD-10-CM

## 2011-05-21 DIAGNOSIS — N400 Enlarged prostate without lower urinary tract symptoms: Secondary | ICD-10-CM

## 2011-05-21 LAB — COMPREHENSIVE METABOLIC PANEL
ALT: 22 U/L (ref 0–53)
AST: 22 U/L (ref 0–37)
Alkaline Phosphatase: 70 U/L (ref 39–117)
CO2: 29 mEq/L (ref 19–32)
Sodium: 141 mEq/L (ref 135–145)
Total Bilirubin: 0.8 mg/dL (ref 0.3–1.2)
Total Protein: 7 g/dL (ref 6.0–8.3)

## 2011-05-21 LAB — CBC WITH DIFFERENTIAL/PLATELET
Basophils Absolute: 0.1 10*3/uL (ref 0.0–0.1)
Eosinophils Absolute: 0.3 10*3/uL (ref 0.0–0.7)
HCT: 43.7 % (ref 39.0–52.0)
Lymphs Abs: 1.6 10*3/uL (ref 0.7–4.0)
Monocytes Absolute: 0.5 10*3/uL (ref 0.1–1.0)
Monocytes Relative: 7.2 % (ref 3.0–12.0)
Platelets: 193 10*3/uL (ref 150.0–400.0)
RDW: 13.8 % (ref 11.5–14.6)

## 2011-05-21 LAB — LIPID PANEL
HDL: 43.9 mg/dL (ref >39.00)
LDL Cholesterol: 70 mg/dL (ref 0–99)
Total CHOL/HDL Ratio: 3
Triglycerides: 83 mg/dL (ref 0.0–149.0)

## 2011-05-28 ENCOUNTER — Encounter: Payer: Self-pay | Admitting: Family Medicine

## 2011-05-28 ENCOUNTER — Ambulatory Visit (INDEPENDENT_AMBULATORY_CARE_PROVIDER_SITE_OTHER): Payer: 59 | Admitting: Family Medicine

## 2011-05-28 VITALS — BP 130/80 | HR 65 | Temp 98.6°F | Ht 76.0 in | Wt 191.0 lb

## 2011-05-28 DIAGNOSIS — Z87898 Personal history of other specified conditions: Secondary | ICD-10-CM

## 2011-05-28 DIAGNOSIS — I10 Essential (primary) hypertension: Secondary | ICD-10-CM

## 2011-05-28 DIAGNOSIS — Z0000 Encounter for general adult medical examination without abnormal findings: Secondary | ICD-10-CM

## 2011-05-28 DIAGNOSIS — E785 Hyperlipidemia, unspecified: Secondary | ICD-10-CM

## 2011-05-28 DIAGNOSIS — Z1211 Encounter for screening for malignant neoplasm of colon: Secondary | ICD-10-CM

## 2011-05-28 DIAGNOSIS — R259 Unspecified abnormal involuntary movements: Secondary | ICD-10-CM

## 2011-05-28 DIAGNOSIS — R251 Tremor, unspecified: Secondary | ICD-10-CM

## 2011-05-28 MED ORDER — ATENOLOL 25 MG PO TABS: 25.0000 mg | ORAL_TABLET | Freq: Two times a day (BID) | ORAL | Status: DC

## 2011-05-28 MED ORDER — AMLODIPINE BESYLATE 5 MG PO TABS: 5.0000 mg | ORAL_TABLET | Freq: Every day | ORAL | Status: DC

## 2011-05-28 MED ORDER — SIMVASTATIN 40 MG PO TABS: 40.0000 mg | ORAL_TABLET | Freq: Every day | ORAL | Status: DC

## 2011-05-28 NOTE — Progress Notes (Signed)
I have personally reviewed the Medicare Annual Wellness questionnaire and have noted 1. The patient's medical and social history 2. Their use of alcohol, tobacco or illicit drugs 3. Their current medications and supplements 4. The patient's functional ability including ADL's, fall risks, home safety risks and hearing or visual             impairment. 5. Diet and physical activities 6. Evidence for depression or mood disorders  The patients weight, height, BMI and visual acuity have been recorded in the chart I have made referrals, counseling and provided education to the patient based review of the above and I have provided the pt with a written personalized care plan for preventive services.  See scanned forms.   Flu 2012 Shingles vaccine pending PNA 2011 Tetanus 2007 He'll call about eye clinic f/u Elects for IFOB, discussed.   PSA okay, DRE per uro.   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 Longstanding numbness in feet w/o sig change recently.  He'll talk to neuro about this.    PMH and SH reviewed  ROS: See HPI, otherwise noncontributory.  Meds, vitals, and allergies reviewed.   nad ncat Mmm rrr ctab abd soft, not ttp Tremor slightly improved from prev visits.   Normal inspection No skin breakdown No calluses  Normal DP pulses Mild dec sensation to light touch and monofilament on plantar side of feet.  Nails normal

## 2011-05-28 NOTE — Patient Instructions (Addendum)
I would get a flu shot each fall.   Check with your insurance to see if they will cover the shingles shot. Go to the lab on the way out for the stool cards.   Take care.  Glad to see you.  [facility].

## 2011-05-31 DIAGNOSIS — Z0000 Encounter for general adult medical examination without abnormal findings: Secondary | ICD-10-CM | POA: Insufficient documentation

## 2011-05-31 DIAGNOSIS — E785 Hyperlipidemia, unspecified: Secondary | ICD-10-CM | POA: Insufficient documentation

## 2011-05-31 NOTE — Assessment & Plan Note (Signed)
Continue current meds, controlled 

## 2011-05-31 NOTE — Assessment & Plan Note (Signed)
Controlled, continue current meds.   

## 2011-05-31 NOTE — Assessment & Plan Note (Signed)
Per uro.  

## 2011-05-31 NOTE — Assessment & Plan Note (Signed)
Per neuro 

## 2011-06-04 ENCOUNTER — Other Ambulatory Visit: Payer: 59

## 2011-06-04 DIAGNOSIS — Z1211 Encounter for screening for malignant neoplasm of colon: Secondary | ICD-10-CM

## 2011-06-05 ENCOUNTER — Encounter: Payer: Self-pay | Admitting: *Deleted

## 2011-06-06 ENCOUNTER — Encounter: Payer: Self-pay | Admitting: *Deleted

## 2011-06-18 ENCOUNTER — Other Ambulatory Visit: Payer: Self-pay

## 2011-06-18 NOTE — Telephone Encounter (Signed)
Pt called back and left [phone] as # i had to call. Pt said he needed his med. I called the [phone] and was offered a trip to the [place]. I called pt back and requested again that he contact [facility] and ask them to fax request for PA because the correct phone # and info needed would be on the form. Pt acknowledged understanding.

## 2011-06-18 NOTE — Telephone Encounter (Signed)
Pt said [facility] would not fill omeprazole, thinks needs another authorization from doctor. Pt will call [facility] and if needs PA will ask [facility] to request from our office. Pt will take OTC omprazole until gets mail order med.

## 2011-06-25 ENCOUNTER — Telehealth: Payer: Self-pay

## 2011-06-25 NOTE — Telephone Encounter (Signed)
Pt needs PA [facility]. Pt asked me to call [phone] for PA spoke with Ty and PA approved from 06/04/11 thru 12/22/11. Letter to be sent to pt and our office. Patient's wife notified as instructed by telephone.

## 2011-06-26 NOTE — Telephone Encounter (Signed)
Letter of [facility] received [facility] for omep[facility]ole from 06/04/11 thru 12/22/11. Since [facility] did not have fax back #. Sent letter of approval for scanning.

## 2011-10-16 ENCOUNTER — Other Ambulatory Visit: Payer: Self-pay | Admitting: Family Medicine

## 2011-10-29 ENCOUNTER — Ambulatory Visit (INDEPENDENT_AMBULATORY_CARE_PROVIDER_SITE_OTHER): Payer: 59

## 2011-10-29 DIAGNOSIS — Z23 Encounter for immunization: Secondary | ICD-10-CM

## 2012-01-12 ENCOUNTER — Encounter: Payer: Self-pay | Admitting: Family Medicine

## 2012-01-15 ENCOUNTER — Other Ambulatory Visit: Payer: Self-pay

## 2012-01-15 NOTE — Telephone Encounter (Signed)
Pt thinks he will need a PA to get his omeprazole. Advised pt to contact pharmacy and pharmacy can request PA if needed.Then explained PA process to pt. Pt will contact pharmacy.

## 2012-03-31 ENCOUNTER — Other Ambulatory Visit: Payer: Self-pay | Admitting: Family Medicine

## 2012-05-07 ENCOUNTER — Telehealth: Payer: Self-pay | Admitting: Family Medicine

## 2012-05-07 NOTE — Telephone Encounter (Signed)
Patient Information:  Caller Name: Damaris  Phone: [phone]  Patient: Hunter, Willis  Gender: Male  DOB: [birth year]  Age: 72 Years  PCP: [name] Clelia Croft) [facility])  Office Follow Up:  Does the office need to follow up with this patient?: No  Instructions For The Office: N/A   Symptoms  Reason For Call & Symptoms: Sneezing, clear nasal drainage.  Emergent symptoms ruled out.  Home care and parameters for callback per Colds protocol.  Reviewed Health History In EMR: Yes  Reviewed Medications In EMR: Yes  Reviewed Allergies In EMR: Yes  Reviewed Surgeries / Procedures: Yes  Date of Onset of Symptoms: 05/06/2012  Treatments Tried: Alka Seltzer Plus  Treatments Tried Worked: Yes  Guideline(s) Used:  Colds  Disposition Per Guideline:   Home Care  Reason For Disposition Reached:   Colds with no complications  Advice Given:  Reassurance  It sounds like an uncomplicated cold that we can treat at home.  Colds are very common and may make you feel uncomfortable.  Colds are caused by viruses, and no medicine or "shot" will cure an uncomplicated cold.  Colds are usually not serious.  Here is some care advice that should help.  For a Runny Nose With Profuse Discharge:   Nasal mucus and discharge helps to wash viruses and bacteria out of the nose and sinuses.  Blowing the nose is all that is needed.  If the skin around your nostrils gets irritated, apply a tiny amount of petroleum ointment to the nasal openings once or twice a day.  Treatment for Associated Symptoms of Colds:  Sore throat: Try throat lozenges, hard candy, or warm chicken broth.  Cough: Use cough drops.  Hydrate: Drink adequate liquids.  Humidifier:  If the air in your home is dry, use a cool-mist humidifier  Call Back If:  Difficulty breathing occurs  Nasal discharge lasts more than 10 days  Cough lasts more than 3 weeks  You become worse  Patient Will Follow Care Advice:  YES

## 2012-05-08 ENCOUNTER — Encounter: Payer: Self-pay | Admitting: Family Medicine

## 2012-05-08 ENCOUNTER — Ambulatory Visit (INDEPENDENT_AMBULATORY_CARE_PROVIDER_SITE_OTHER): Payer: 59 | Admitting: Family Medicine

## 2012-05-08 VITALS — BP 138/80 | HR 68 | Temp 98.5°F | Ht 75.75 in | Wt 190.2 lb

## 2012-05-08 DIAGNOSIS — K219 Gastro-esophageal reflux disease without esophagitis: Secondary | ICD-10-CM

## 2012-05-08 DIAGNOSIS — J069 Acute upper respiratory infection, unspecified: Secondary | ICD-10-CM | POA: Insufficient documentation

## 2012-05-08 MED ORDER — GUAIFENESIN-CODEINE 100-10 MG/5ML PO SYRP: 5.0000 mL | ORAL_SOLUTION | Freq: Every evening | ORAL | Status: DC | PRN

## 2012-05-08 NOTE — Assessment & Plan Note (Signed)
Anticipated viral URI - supportive care as per instructions. Prescribed cheratussin per pt preference.  Sedation precautions discussed. Red flags to return discussed.

## 2012-05-08 NOTE — Telephone Encounter (Signed)
Noted  

## 2012-05-08 NOTE — Patient Instructions (Addendum)
Sounds like you have a viral upper respiratory infection. Antibiotics are not needed for this.  Viral infections usually take 7-10 days to resolve.  The cough can last several weeks to go away. Push fluids and plenty of rest. May use codeine cough syrup as needed at nighttime because it can make you sleepy. May use plain mucinex with plenty of water and over the counter cough syrup like delsym or dimetapp. Please return if you are not improving as expected, or if you have high fevers (>101.5) or difficulty swallowing or worsening productive cough. Call clinic with questions.  Good to see you today.

## 2012-05-08 NOTE — Assessment & Plan Note (Signed)
Provided with samples of nexium to try - if effective, to update [place] for script.

## 2012-05-08 NOTE — Progress Notes (Signed)
  Subjective:    Patient ID: Hunter Willis, male    DOB: [birth year], 72 y.o.   MRN: [id number]  HPI CC: congestion  3d ago started with rhinorrhea and sneezing.  Progressed to cough productive of yellow phlegm.  PNdrainage present.  R frontal sinus pressure earlier today, now better.  Sleeping well at night.  So far has tried OTC remedies including alka seltzer plus.  No fevers/chills, abd pain, nausea, ear or tooth pain, HA, ST.  Overall very healthy. 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 Wife sick 3 wks ago with URI, some residual mild cough still present No smokers at home. No h/o asthma/COPD. No h/o allergic rhinitis.  GERD - states omeprazole 40mg  is losing effect.  Wonders about any med that can be better for this.  Feels burn in back of throat.  Prior on prevacid, didn't help.  Prior on other OTC meds like zantac and 20mg  prilosec which didn't help.  Past Medical History  Diagnosis Date  . Allergy   . Hypertension   . GERD (gastroesophageal reflux disease)   . Hyperlipidemia   . Tremor   . BPH (benign prostatic hyperplasia)     followed by Dr. Evelene Croon  . Renal stones      Review of Systems Per HPI    Objective:   Physical Exam  Nursing note and vitals reviewed. Constitutional: He appears well-developed and well-nourished. No distress.  HENT:  Head: Normocephalic and atraumatic.  Right Ear: Hearing, tympanic membrane, external ear and ear canal normal.  Left Ear: Hearing, tympanic membrane, external ear and ear canal normal.  Nose: Nose normal. No mucosal edema or rhinorrhea. Right sinus exhibits no maxillary sinus tenderness and no frontal sinus tenderness. Left sinus exhibits no maxillary sinus tenderness and no frontal sinus tenderness.  Mouth/Throat: Uvula is midline and mucous membranes are normal. Posterior oropharyngeal erythema present. No oropharyngeal exudate, posterior oropharyngeal edema or tonsillar abscesses.  Eyes: Conjunctivae and EOM are  normal. Pupils are equal, round, and reactive to light. No scleral icterus.  Neck: Normal range of motion. Neck supple.  Cardiovascular: Normal rate, regular rhythm, normal heart sounds and intact distal pulses.   No murmur heard. Pulmonary/Chest: Effort normal and breath sounds normal. No respiratory distress. He has no wheezes. He has no rales.  Lymphadenopathy:    He has no cervical adenopathy.  Skin: Skin is warm and dry. No rash noted.       Assessment & Plan:

## 2012-05-19 ENCOUNTER — Telehealth: Payer: Self-pay

## 2012-05-19 DIAGNOSIS — K219 Gastro-esophageal reflux disease without esophagitis: Secondary | ICD-10-CM

## 2012-05-19 MED ORDER — ESOMEPRAZOLE MAGNESIUM 40 MG PO CPDR: 40.0000 mg | DELAYED_RELEASE_CAPSULE | Freq: Every day | ORAL | Status: DC

## 2012-05-19 NOTE — Telephone Encounter (Signed)
Noted, thanks!

## 2012-05-19 NOTE — Telephone Encounter (Signed)
See my office note for details.  plz notify we will send in script to pharmacy. Will route to PCP as fyi.

## 2012-05-19 NOTE — Telephone Encounter (Signed)
Pt said Nexium  40 mg samples has helped the acid reflux or burning in back of throat; Pt request 90 day rx Nexium [facility]

## 2012-05-19 NOTE — Telephone Encounter (Signed)
Patient notified

## 2012-05-21 ENCOUNTER — Other Ambulatory Visit: Payer: Self-pay | Admitting: Family Medicine

## 2012-06-17 ENCOUNTER — Other Ambulatory Visit: Payer: Self-pay | Admitting: Family Medicine

## 2012-06-17 NOTE — Telephone Encounter (Signed)
Received refilled request electronically from pharmacy. Patient has not been seen by you in over a year, but has an appointment scheduled for 08/10/12. Is it okay to refill medication?

## 2012-06-18 NOTE — Telephone Encounter (Signed)
Yes, sent

## 2012-07-06 ENCOUNTER — Other Ambulatory Visit: Payer: Self-pay | Admitting: Family Medicine

## 2012-07-24 ENCOUNTER — Other Ambulatory Visit: Payer: Self-pay | Admitting: Family Medicine

## 2012-07-24 DIAGNOSIS — I10 Essential (primary) hypertension: Secondary | ICD-10-CM

## 2012-08-03 ENCOUNTER — Other Ambulatory Visit (INDEPENDENT_AMBULATORY_CARE_PROVIDER_SITE_OTHER): Payer: 59

## 2012-08-03 DIAGNOSIS — I10 Essential (primary) hypertension: Secondary | ICD-10-CM

## 2012-08-03 LAB — COMPREHENSIVE METABOLIC PANEL
ALT: 14 U/L (ref 0–53)
AST: 28 U/L (ref 0–37)
Creatinine, Ser: 1 mg/dL (ref 0.4–1.5)
Total Bilirubin: 0.6 mg/dL (ref 0.3–1.2)

## 2012-08-03 LAB — LIPID PANEL
HDL: 45.2 mg/dL (ref >39.00)
Total CHOL/HDL Ratio: 2
VLDL: 10.4 mg/dL (ref 0.0–40.0)

## 2012-08-10 ENCOUNTER — Ambulatory Visit (INDEPENDENT_AMBULATORY_CARE_PROVIDER_SITE_OTHER): Payer: 59 | Admitting: Family Medicine

## 2012-08-10 ENCOUNTER — Encounter: Payer: Self-pay | Admitting: Family Medicine

## 2012-08-10 VITALS — BP 102/60 | HR 66 | Temp 97.8°F | Ht 77.0 in | Wt 185.5 lb

## 2012-08-10 DIAGNOSIS — R251 Tremor, unspecified: Secondary | ICD-10-CM

## 2012-08-10 DIAGNOSIS — Z1211 Encounter for screening for malignant neoplasm of colon: Secondary | ICD-10-CM

## 2012-08-10 DIAGNOSIS — K219 Gastro-esophageal reflux disease without esophagitis: Secondary | ICD-10-CM

## 2012-08-10 DIAGNOSIS — E785 Hyperlipidemia, unspecified: Secondary | ICD-10-CM

## 2012-08-10 DIAGNOSIS — I10 Essential (primary) hypertension: Secondary | ICD-10-CM

## 2012-08-10 DIAGNOSIS — R259 Unspecified abnormal involuntary movements: Secondary | ICD-10-CM

## 2012-08-10 DIAGNOSIS — Z0000 Encounter for general adult medical examination without abnormal findings: Secondary | ICD-10-CM

## 2012-08-10 DIAGNOSIS — R7309 Other abnormal glucose: Secondary | ICD-10-CM

## 2012-08-10 DIAGNOSIS — R739 Hyperglycemia, unspecified: Secondary | ICD-10-CM | POA: Insufficient documentation

## 2012-08-10 NOTE — Assessment & Plan Note (Signed)
Controlled with current meds.  Discussed today.

## 2012-08-10 NOTE — Assessment & Plan Note (Signed)
D/w pt about diet and labs.

## 2012-08-10 NOTE — Assessment & Plan Note (Signed)
Per neuro 

## 2012-08-10 NOTE — Assessment & Plan Note (Signed)
Stop amlodipine and he'll notify me if BP elevated or other concerns.

## 2012-08-10 NOTE — Assessment & Plan Note (Signed)
Okay for now, continue statin. Diet discussed.

## 2012-08-10 NOTE — Progress Notes (Signed)
I have personally reviewed the Medicare Annual Wellness questionnaire and have noted 1. The patient's medical and social history 2. Their use of alcohol, tobacco or illicit drugs 3. Their current medications and supplements 4. The patient's functional ability including ADL's, fall risks, home safety risks and hearing or visual             impairment. 5. Diet and physical activities 6. Evidence for depression or mood disorders  The patients weight, height, BMI have been recorded in the chart and visual acuity is per eye clinic.  I have made referrals, counseling and provided education to the patient based review of the above and I have provided the pt with a written personalized care plan for preventive services.  See scanned forms.  Routine anticipatory guidance given to patient.  See health maintenance. Flu 2013 Shingles encouraged PNA 2011 Tetanus 2007 D/w patient WJ:[id number] for colon cancer screening, including IFOB vs. colonoscopy.  Risks and benefits of both were discussed and patient voiced understanding.  Pt elects NFA:OZHY Prostate cancer screening per uro- pt to f/u with uro about this.  Cognitive function addressed- see scanned forms- and if abnormal then additional documentation follows.   He has overall some increase in nocturia, not consistent and he'll f/u with uro about this.    Tremor likely at baseline and he has seen neuro about this.   Hypertension:    Using medication without problems or lightheadedness: occ lightheaded, no syncope Chest pain with exertion:no Edema:no Short of breath:no  Elevated Cholesterol: Using medications without problems:y Muscle aches: n Diet compliance:partial, discussed Exercise: limited by tremor  Mild inc in sugar noted, discussed re: labs, diet.   PMH and SH reviewed  Meds, vitals, and allergies reviewed.   ROS: See HPI.  Otherwise negative.    GEN: nad, alert and oriented, tremor noted in hands and jaw.  HEENT: mucous  membranes moist NECK: supple w/o LA CV: rrr. PULM: ctab, no inc wob ABD: soft, +bs EXT: no edema SKIN: no acute rash

## 2012-08-10 NOTE — Assessment & Plan Note (Signed)
See scanned forms.  Routine anticipatory guidance given to patient.  See health maintenance. Flu 2013 Shingles encouraged PNA 2011 Tetanus 2007 D/w patient [id number] for colon cancer screening, including IFOB vs. colonoscopy.  Risks and benefits of both were discussed and patient voiced understanding.  Pt elects WUJ:WJXB Prostate cancer screening per uro- pt to f/u with uro about this.  Cognitive function addressed- see scanned forms- and if abnormal then additional documentation follows.

## 2012-08-10 NOTE — Patient Instructions (Addendum)
Go to the lab on the way out.  We'll contact you with your lab report.  Check with your insurance to see if they will cover the shingles shot. Stop the amlodipine and see if you feel better.  Check your BP after your stop the medicine and it should be higher than today.  If >140/>90 then let me know.  Take care.  Cut back on sweets.

## 2013-01-12 ENCOUNTER — Telehealth: Payer: Self-pay

## 2013-01-12 MED ORDER — ATENOLOL 25 MG PO TABS: ORAL_TABLET | ORAL | Status: DC

## 2013-01-12 NOTE — Telephone Encounter (Signed)
Pt request 30 day refill atenolol to [facility] until [facility] advised pt done.

## 2013-05-03 DIAGNOSIS — G250 Essential tremor: Secondary | ICD-10-CM | POA: Insufficient documentation

## 2013-05-03 DIAGNOSIS — K219 Gastro-esophageal reflux disease without esophagitis: Secondary | ICD-10-CM | POA: Insufficient documentation

## 2013-05-03 DIAGNOSIS — I10 Essential (primary) hypertension: Secondary | ICD-10-CM | POA: Insufficient documentation

## 2013-05-03 DIAGNOSIS — R739 Hyperglycemia, unspecified: Secondary | ICD-10-CM | POA: Insufficient documentation

## 2013-05-06 ENCOUNTER — Other Ambulatory Visit: Payer: Self-pay

## 2013-05-06 MED ORDER — ATENOLOL 25 MG PO TABS: ORAL_TABLET | ORAL | Status: DC

## 2013-05-06 NOTE — Telephone Encounter (Signed)
[facility]; pt has appt 08/13/13 for CPX. Pt advised done.

## 2013-05-30 ENCOUNTER — Inpatient Hospital Stay (HOSPITAL_COMMUNITY): Payer: Medicare Other

## 2013-05-30 ENCOUNTER — Encounter (HOSPITAL_COMMUNITY): Payer: Self-pay | Admitting: Emergency Medicine

## 2013-05-30 ENCOUNTER — Emergency Department (HOSPITAL_COMMUNITY): Payer: Medicare Other

## 2013-05-30 ENCOUNTER — Inpatient Hospital Stay (HOSPITAL_COMMUNITY)
Admission: EM | Admit: 2013-05-30 | Discharge: 2013-06-02 | DRG: 470 | Disposition: A | Discharge status: Skilled Nursing Facility | Payer: Medicare Other | Attending: Internal Medicine | Admitting: Internal Medicine

## 2013-05-30 DIAGNOSIS — M25539 Pain in unspecified wrist: Secondary | ICD-10-CM | POA: Diagnosis present

## 2013-05-30 DIAGNOSIS — Z87442 Personal history of urinary calculi: Secondary | ICD-10-CM

## 2013-05-30 DIAGNOSIS — I10 Essential (primary) hypertension: Secondary | ICD-10-CM | POA: Diagnosis present

## 2013-05-30 DIAGNOSIS — N200 Calculus of kidney: Secondary | ICD-10-CM

## 2013-05-30 DIAGNOSIS — K208 Other esophagitis without bleeding: Secondary | ICD-10-CM

## 2013-05-30 DIAGNOSIS — M549 Dorsalgia, unspecified: Secondary | ICD-10-CM

## 2013-05-30 DIAGNOSIS — G20A1 Parkinson's disease without dyskinesia, without mention of fluctuations: Secondary | ICD-10-CM | POA: Diagnosis present

## 2013-05-30 DIAGNOSIS — E559 Vitamin D deficiency, unspecified: Secondary | ICD-10-CM

## 2013-05-30 DIAGNOSIS — N400 Enlarged prostate without lower urinary tract symptoms: Secondary | ICD-10-CM | POA: Diagnosis present

## 2013-05-30 DIAGNOSIS — Z87898 Personal history of other specified conditions: Secondary | ICD-10-CM

## 2013-05-30 DIAGNOSIS — Z0000 Encounter for general adult medical examination without abnormal findings: Secondary | ICD-10-CM

## 2013-05-30 DIAGNOSIS — H6980 Other specified disorders of Eustachian tube, unspecified ear: Secondary | ICD-10-CM

## 2013-05-30 DIAGNOSIS — R251 Tremor, unspecified: Secondary | ICD-10-CM

## 2013-05-30 DIAGNOSIS — S72043A Displaced fracture of base of neck of unspecified femur, initial encounter for closed fracture: Secondary | ICD-10-CM

## 2013-05-30 DIAGNOSIS — Z79899 Other long term (current) drug therapy: Secondary | ICD-10-CM

## 2013-05-30 DIAGNOSIS — R259 Unspecified abnormal involuntary movements: Secondary | ICD-10-CM

## 2013-05-30 DIAGNOSIS — Z8042 Family history of malignant neoplasm of prostate: Secondary | ICD-10-CM

## 2013-05-30 DIAGNOSIS — G20 Parkinson's disease: Secondary | ICD-10-CM | POA: Diagnosis present

## 2013-05-30 DIAGNOSIS — K219 Gastro-esophageal reflux disease without esophagitis: Secondary | ICD-10-CM | POA: Diagnosis present

## 2013-05-30 DIAGNOSIS — Z8249 Family history of ischemic heart disease and other diseases of the circulatory system: Secondary | ICD-10-CM

## 2013-05-30 DIAGNOSIS — Z833 Family history of diabetes mellitus: Secondary | ICD-10-CM

## 2013-05-30 DIAGNOSIS — R739 Hyperglycemia, unspecified: Secondary | ICD-10-CM

## 2013-05-30 DIAGNOSIS — J309 Allergic rhinitis, unspecified: Secondary | ICD-10-CM

## 2013-05-30 DIAGNOSIS — W108XXA Fall (on) (from) other stairs and steps, initial encounter: Secondary | ICD-10-CM | POA: Diagnosis present

## 2013-05-30 DIAGNOSIS — S72009A Fracture of unspecified part of neck of unspecified femur, initial encounter for closed fracture: Principal | ICD-10-CM | POA: Diagnosis present

## 2013-05-30 DIAGNOSIS — E785 Hyperlipidemia, unspecified: Secondary | ICD-10-CM | POA: Diagnosis present

## 2013-05-30 LAB — CBC WITH DIFFERENTIAL/PLATELET
BASOS ABS: 0 10*3/uL (ref 0.0–0.1)
Basophils Relative: 0 % (ref 0–1)
EOS PCT: 2 % (ref 0–5)
Eosinophils Absolute: 0.2 10*3/uL (ref 0.0–0.7)
HCT: 41.9 % (ref 39.0–52.0)
Hemoglobin: 14 g/dL (ref 13.0–17.0)
LYMPHS PCT: 9 % — AB (ref 12–46)
Lymphs Abs: 0.8 10*3/uL (ref 0.7–4.0)
MCH: 32 pg (ref 26.0–34.0)
MCHC: 33.4 g/dL (ref 30.0–36.0)
MCV: 95.9 fL (ref 78.0–100.0)
Monocytes Absolute: 0.7 10*3/uL (ref 0.1–1.0)
Monocytes Relative: 7 % (ref 3–12)
Neutro Abs: 7.7 10*3/uL (ref 1.7–7.7)
Neutrophils Relative %: 82 % — ABNORMAL HIGH (ref 43–77)
PLATELETS: 196 10*3/uL (ref 150–400)
RBC: 4.37 MIL/uL (ref 4.22–5.81)
RDW: 13.3 % (ref 11.5–15.5)
WBC: 9.5 10*3/uL (ref 4.0–10.5)

## 2013-05-30 LAB — BASIC METABOLIC PANEL
BUN: 29 mg/dL — ABNORMAL HIGH (ref 6–23)
CALCIUM: 10.1 mg/dL (ref 8.4–10.5)
CO2: 29 mEq/L (ref 19–32)
CREATININE: 0.93 mg/dL (ref 0.50–1.35)
Chloride: 99 mEq/L (ref 96–112)
GFR calc non Af Amer: 81 mL/min — ABNORMAL LOW (ref >90)
Glucose, Bld: 99 mg/dL (ref 70–99)
Potassium: 4.4 mEq/L (ref 3.7–5.3)
Sodium: 139 mEq/L (ref 137–147)

## 2013-05-30 LAB — PROTIME-INR
INR: 1.13 (ref 0.00–1.49)
PROTHROMBIN TIME: 14.3 s (ref 11.6–15.2)

## 2013-05-30 LAB — TYPE AND SCREEN
ABO/RH(D): O POS
Antibody Screen: NEGATIVE

## 2013-05-30 LAB — ABO/RH: ABO/RH(D): O POS

## 2013-05-30 MED ORDER — ACETAMINOPHEN 325 MG PO TABS: 650.0000 mg | ORAL_TABLET | Freq: Four times a day (QID) | ORAL | Status: DC | PRN

## 2013-05-30 MED ORDER — SIMVASTATIN 40 MG PO TABS
40.0000 mg | ORAL_TABLET | Freq: Every day | ORAL | Status: DC
  Administered 2013-05-31 – 2013-06-02 (×3): 40 mg via ORAL
  Filled 2013-05-30 (×3): qty 1

## 2013-05-30 MED ORDER — DONEPEZIL HCL 5 MG PO TABS
5.0000 mg | ORAL_TABLET | Freq: Every day | ORAL | Status: DC
  Administered 2013-05-30 – 2013-06-01 (×3): 5 mg via ORAL
  Filled 2013-05-30 (×4): qty 1

## 2013-05-30 MED ORDER — FENTANYL CITRATE 0.05 MG/ML IJ SOLN: 50.0000 ug | Freq: Once | INTRAMUSCULAR | Status: DC | PRN

## 2013-05-30 MED ORDER — ONDANSETRON HCL 4 MG/2ML IJ SOLN: 4.0000 mg | Freq: Four times a day (QID) | INTRAMUSCULAR | Status: DC | PRN

## 2013-05-30 MED ORDER — SENNA 8.6 MG PO TABS: 1.0000 | ORAL_TABLET | Freq: Every day | ORAL | Status: DC | PRN

## 2013-05-30 MED ORDER — HYDROCODONE-ACETAMINOPHEN 5-325 MG PO TABS
1.0000 | ORAL_TABLET | ORAL | Status: DC | PRN
Start: 2013-05-30 — End: 2013-05-30

## 2013-05-30 MED ORDER — SODIUM CHLORIDE 0.9 % IV SOLN
1000.0000 mL | INTRAVENOUS | Status: DC
  Administered 2013-05-30 – 2013-05-31 (×4): 1000 mL via INTRAVENOUS

## 2013-05-30 MED ORDER — CARBIDOPA 25 MG PO TABS
25.0000 mg | ORAL_TABLET | Freq: Three times a day (TID) | ORAL | Status: DC
  Administered 2013-05-31 – 2013-06-01 (×4): 1 via ORAL
  Administered 2013-06-02: 25 mg via ORAL

## 2013-05-30 MED ORDER — FAMOTIDINE 10 MG PO TABS
10.0000 mg | ORAL_TABLET | Freq: Every day | ORAL | Status: DC
  Administered 2013-05-31 – 2013-06-02 (×3): 10 mg via ORAL
  Filled 2013-05-30 (×3): qty 1

## 2013-05-30 MED ORDER — ATENOLOL 25 MG PO TABS
25.0000 mg | ORAL_TABLET | Freq: Two times a day (BID) | ORAL | Status: DC
  Administered 2013-05-30 – 2013-06-02 (×6): 25 mg via ORAL
  Filled 2013-05-30 (×7): qty 1

## 2013-05-30 MED ORDER — ONDANSETRON HCL 4 MG/2ML IJ SOLN: 4.0000 mg | Freq: Three times a day (TID) | INTRAMUSCULAR | Status: DC | PRN

## 2013-05-30 MED ORDER — VITAMIN D3 25 MCG (1000 UNIT) PO TABS
1000.0000 [IU] | ORAL_TABLET | Freq: Every day | ORAL | Status: DC
  Administered 2013-05-31 – 2013-06-02 (×3): 1000 [IU] via ORAL
  Filled 2013-05-30 (×3): qty 1

## 2013-05-30 MED ORDER — FENTANYL CITRATE 0.05 MG/ML IJ SOLN
50.0000 ug | Freq: Once | INTRAMUSCULAR | Status: AC
  Administered 2013-05-30: 50 ug via INTRAVENOUS
  Filled 2013-05-30: qty 2

## 2013-05-30 MED ORDER — GLYCOPYRROLATE 1 MG PO TABS
1.0000 mg | ORAL_TABLET | Freq: Two times a day (BID) | ORAL | Status: DC
  Administered 2013-05-30 – 2013-06-02 (×6): 1 mg via ORAL
  Filled 2013-05-30 (×7): qty 1

## 2013-05-30 MED ORDER — ONE-DAILY MULTI VITAMINS PO TABS: 1.0000 | ORAL_TABLET | Freq: Every day | ORAL | Status: DC

## 2013-05-30 MED ORDER — HYDROCODONE-ACETAMINOPHEN 5-325 MG PO TABS
1.0000 | ORAL_TABLET | ORAL | Status: DC | PRN
  Administered 2013-05-30 – 2013-05-31 (×4): 1 via ORAL
  Administered 2013-05-31: 2 via ORAL
  Filled 2013-05-30 (×4): qty 1
  Filled 2013-05-30: qty 2

## 2013-05-30 MED ORDER — CARBIDOPA-LEVODOPA 25-100 MG PO TABS
1.0000 | ORAL_TABLET | Freq: Three times a day (TID) | ORAL | Status: DC
  Administered 2013-05-30 – 2013-06-02 (×7): 1 via ORAL
  Filled 2013-05-30 (×10): qty 1

## 2013-05-30 MED ORDER — ROTIGOTINE 8 MG/24HR TD PT24
8.0000 mg | MEDICATED_PATCH | Freq: Every day | TRANSDERMAL | Status: DC
  Administered 2013-06-01 – 2013-06-02 (×2): 8 mg via TRANSDERMAL

## 2013-05-30 MED ORDER — HYDROMORPHONE HCL PF 1 MG/ML IJ SOLN
1.0000 mg | INTRAMUSCULAR | Status: DC | PRN
  Administered 2013-05-30 – 2013-05-31 (×3): 1 mg via INTRAVENOUS
  Filled 2013-05-30 (×3): qty 1

## 2013-05-30 MED ORDER — ADULT MULTIVITAMIN W/MINERALS CH
1.0000 | ORAL_TABLET | Freq: Every day | ORAL | Status: DC
  Administered 2013-05-31 – 2013-06-02 (×3): 1 via ORAL
  Filled 2013-05-30 (×3): qty 1

## 2013-05-30 MED ORDER — BENZTROPINE MESYLATE 0.5 MG PO TABS
0.5000 mg | ORAL_TABLET | Freq: Two times a day (BID) | ORAL | Status: DC
  Administered 2013-05-30 – 2013-06-02 (×6): 0.5 mg via ORAL
  Filled 2013-05-30 (×7): qty 1

## 2013-05-30 NOTE — ED Notes (Signed)
Patient transported to X-ray 

## 2013-05-30 NOTE — Progress Notes (Signed)
Report received by phone from ED RN. Pt received to room. [employer] Dorethia Jeanmarie

## 2013-05-30 NOTE — ED Notes (Signed)
Bed: [id number] Expected date:  Expected time:  Means of arrival:  Comments: EMS- hip injury

## 2013-05-30 NOTE — ED Notes (Signed)
PER EMS- pt picked up from family member house with c/o l hip injury.  Reports pt was walking outside and slipped on steps.  Denies head injury or LOC.  Pt reports 8/10 pain.  PTA pt given 100 mcg of fentanyl.  Currently denies pain unless he moves.

## 2013-05-30 NOTE — Consult Note (Signed)
Patient ID: Hunter Willis MRN: [id number] DOB/AGE: 75/03/1940 75 y.o.  Admit date: 05/30/2013  Admission Diagnoses:  Active Problems:   HYPERTENSION   Femoral neck fracture   Hip fracture   Parkinson disease   HPI: Presented to the ED with left hip pain and left wrist pain.  Hunter SeatFell down a couple of steps earlier today and landed on left hip.  Unable to weightbear. States that he also hurt his left wrist when he tried to catch himself.  Left hip xrays in the ED showed a femoral neck fracture.  No other issues.  Denies cardiac, pulm issues.  No LOC.    Past Medical History: Past Medical History  Diagnosis Date  . Allergy   . Hypertension   . GERD (gastroesophageal reflux disease)   . Hyperlipidemia   . Tremor   . BPH (benign prostatic hyperplasia)     followed by Dr. Evelene Willis  . Renal stones     Surgical History: Past Surgical History  Procedure Laterality Date  . Esophagogastroduodenoscopy  01/1998  . Cystoscopy tumor / condylomata w/ laser  01/26/2001  . Emg  06/23/2001    NCL LE'S wnl, Dr. Kemper Willis  . Lithotripsy  10/2003    kidney stone, Dr. Artis Willis    Family History: Family History  Problem Relation Age of Onset  . Diabetes Mother   . Diabetes Sister   . Cancer Brother     prostate, terminal  . Prostate cancer Brother   . Hypertension Brother   . Hypertension Brother   . Hypertension Brother   . Depression Sister   . Cancer Sister     throat  . Heart disease Father   . Heart disease Brother   . Colon cancer Neg Hx     Social History: History   Social History  . Marital Status: Married    Spouse Name: N/A    Number of Children: 0  . Years of Education: N/A   Occupational History  . Security guard part time    Social History Main Topics  . Smoking status: Never Smoker   . Smokeless tobacco: Not on file  . Alcohol Use: No  . Drug Use: Not on file  . Sexual Activity: Not on file   Other Topics Concern  . Not on file   Social History  Narrative   Marital Status: Married 1966   Children: none   Occupation: prev Electrical engineersecurity guard part-time    Allergies: Review of patient's allergies indicates no known allergies.  Medications: I have reviewed the patient's current medications.  Vital Signs: Patient Vitals for the past 24 hrs:  BP Temp Temp src Pulse Resp SpO2  05/30/13 1803 154/88 mmHg - - - - -  05/30/13 1745 - - - 71 16 99 %  05/30/13 1501 113/62 mmHg 97.8 F (36.6 C) Oral 54 18 96 %    Radiology: Dg Chest 1 View  05/30/2013   CLINICAL DATA:  Fall.  Left hip pain.  EXAM: CHEST - 1 VIEW  COMPARISON:  None.  FINDINGS: Cardiopericardial silhouette within normal limits. Aortic arch atherosclerosis. Monitoring leads project over the chest. No airspace disease. Subsegmental atelectasis or scarring near the costophrenic angle on the right. No effusion. No pneumothorax. Right glenohumeral osteoarthritis. Monitoring leads project over the chest.  IMPRESSION: No active disease.   Electronically Signed   By: Hunter Willis M.D.   On: 05/30/2013 16:19   Dg Hip Complete Left  05/30/2013   CLINICAL DATA:  Fall.  Left hip pain.  EXAM: LEFT HIP - COMPLETE 2+ VIEW  COMPARISON:  None.  FINDINGS: Cervical left femoral neck fracture is present. Mild pre-existing left hip osteoarthritis is present. Femoral head remains located. Pelvic rings appear intact.  IMPRESSION: Cervical left femoral neck fracture.   Electronically Signed   By: Hunter Willis M.D.   On: 05/30/2013 16:18    Labs:  Recent Labs  05/30/13 1600  WBC 9.5  RBC 4.37  HCT 41.9  PLT 196    Recent Labs  05/30/13 1600  NA 139  K 4.4  CL 99  CO2 29  BUN 29*  CREATININE 0.93  GLUCOSE 99  CALCIUM 10.1    Recent Labs  05/30/13 1600  INR 1.13    Review of Systems: Review of Systems  Constitutional: Negative.   Respiratory: Negative.   Cardiovascular: Negative.   Musculoskeletal: Positive for falls and joint pain. Negative for back pain and neck pain.   Neurological: Positive for tremors.  Psychiatric/Behavioral: Negative.     Physical Exam: Alert and oriented.  Pos log roll left hip.  Left LE shortened and externally rotated.  Calf nontender, NVI.  Thigh soft, min swelling.    Left wrist, good ROM.  Dorsal TTP.  No swelling or obvious deformity.    Assessment and Plan: 1) left hip femoral neck fracture 2) left wrist pain  Will admit patient.  Advised him and family members present that treatment for his injury will likely be left hip hemiarthroplasty.  Surgical procedure briefly discussed along with possible recovery time.  Will need snf placement postop.  Ortho tech to apply bucks traction now.  Will get left wrist xray to r/o fracture.  Case discussed with Dr Hunter Willis.      Hunter Lickahari Ariele Vidrio, MD [facility] [phone]   Agree with above Patient with femoral neck fracture Will require hemiarthroplasty Will discuss with Dr Charlann Boxerlin

## 2013-05-30 NOTE — H&P (Signed)
[facility] History and Physical  Hunter RecordsJerry D Willis XLK:[id number] DOB: 06/06/1940 DOA: 05/30/2013  Referring physician: EDP PCP: Crawford GivensGraham Duncan, MD   Chief Complaint: fall, hip pain  HPI: Hunter Willis is a 73 y.o. male with PMh of Parkinson's disease with severe tremors, HTN presents to the ER with the above complaint. He was in his usual state of health today, was walking down the steps, he miscalculated the step and fell and hurt his L hip. He denies any loss of consciousness, no dizziness, chest pain or palpitations. In ER, noted to have a L femur neck fracture and Dr.Brooks was consulted as well as [facility]   Review of Systems: positives bolded Constitutional:  No weight loss, night sweats, Fevers, chills, fatigue.  HEENT:  No headaches, Difficulty swallowing,Tooth/dental problems,Sore throat,  No sneezing, itching, ear ache, nasal congestion, post nasal drip,  Cardio-vascular:  No chest pain, Orthopnea, PND, swelling in lower extremities, anasarca, dizziness, palpitations  GI:  No heartburn, indigestion, abdominal pain, nausea, vomiting, diarrhea, change in bowel habits, loss of appetite  Resp:  No shortness of breath with exertion or at rest. No excess mucus, no productive cough, No non-productive cough, No coughing up of blood.No change in color of mucus.No wheezing.No chest wall deformity  Skin:  no rash or lesions.  GU:  no dysuria, change in color of urine, no urgency or frequency. No flank pain.  Musculoskeletal:  L hip pain, tremors Psych:  No change in mood or affect. No depression or anxiety. No memory loss.   Past Medical History  Diagnosis Date  . Allergy   . Hypertension   . GERD (gastroesophageal reflux disease)   . Hyperlipidemia   . Tremor   . BPH (benign prostatic hyperplasia)     followed by Dr. Evelene CroonWolff  . Renal stones    Past Surgical History  Procedure Laterality Date  . Esophagogastroduodenoscopy  01/1998  . Cystoscopy tumor / condylomata w/  laser  01/26/2001  . Emg  06/23/2001    NCL LE'S wnl, Dr. Kemper Durielarke  . Lithotripsy  10/2003    kidney stone, Dr. Artis FlockWolfe   Social History:  reports that he has never smoked. He does not have any smokeless tobacco history on file. He reports that he does not drink alcohol. His drug history is not on file.  No Known Allergies  Family History  Problem Relation Age of Onset  . Diabetes Mother   . Diabetes Sister   . Cancer Brother     prostate, terminal  . Prostate cancer Brother   . Hypertension Brother   . Hypertension Brother   . Hypertension Brother   . Depression Sister   . Cancer Sister     throat  . Heart disease Father   . Heart disease Brother   . Colon cancer Neg Hx      Prior to Admission medications   Medication Sig Start Date End Date Taking? Authorizing Provider  atenolol (TENORMIN) 25 MG tablet Take 25 mg by mouth 2 (two) times daily.   Yes Historical Provider, MD  benztropine (COGENTIN) 0.5 MG tablet Take 0.5 mg by mouth 2 (two) times daily.     Yes Historical Provider, MD  Carbidopa 25 MG tablet Take 25 mg by mouth 3 (three) times daily.   Yes Historical Provider, MD  carbidopa-levodopa (SINEMET) 25-100 MG per tablet Take 1 tablet by mouth 3 (three) times daily.    Yes Historical Provider, MD  cholecalciferol (VITAMIN D) 1000 UNITS tablet Take 1,000  Units by mouth daily.     Yes Historical Provider, MD  donepezil (ARICEPT) 5 MG tablet Take 5 mg by mouth at bedtime.   Yes Historical Provider, MD  glycopyrrolate (ROBINUL) 1 MG tablet Take 1 mg by mouth 2 (two) times daily.   Yes Historical Provider, MD  Multiple Vitamin (MULTIVITAMIN) tablet Take 1 tablet by mouth 2 (two) times daily.    Yes Historical Provider, MD  OVER THE COUNTER MEDICATION Take 1,500 mg by mouth daily. "Muscle enhancer."   Yes Historical Provider, MD  Ranitidine HCl (ZANTAC PO) Take 1 tablet by mouth every morning.   Yes Historical Provider, MD  Rotigotine (NEUPRO) 8 MG/24HR PT24 Place 8 mg onto the  skin daily.   Yes Historical Provider, MD  simvastatin (ZOCOR) 40 MG tablet Take 40 mg by mouth daily.   Yes Historical Provider, MD   Physical Exam: Filed Vitals:   05/30/13 1501  BP: 113/62  Pulse: 54  Temp: 97.8 F (36.6 C)  Resp: 18    BP 113/62  Pulse 54  Temp(Src) 97.8 F (36.6 C) (Oral)  Resp 18  SpO2 96%  General:  Appears calm and comfortable, severe tremors of both arms Eyes: PERRL, normal lids, irises & conjunctiva ENT: grossly normal hearing, lips & tongue Neck: no LAD, masses or thyromegaly Cardiovascular: RRR, no m/r/g. No LE edema. Telemetry: SR, no arrhythmias  Respiratory: CTA bilaterally, no w/r/r. Normal respiratory effort. Abdomen: soft, ntnd Skin: no rash or induration seen on limited exam Musculoskeletal: trace edema  Psychiatric: grossly normal mood and affect, speech fluent and appropriate Neurologic: profound tremors, increased tone          Labs on Admission:  Basic Metabolic Panel:  Recent Labs Lab 05/30/13 1600  NA 139  K 4.4  CL 99  CO2 29  GLUCOSE 99  BUN 29*  CREATININE 0.93  CALCIUM 10.1   Liver Function Tests: No results found for this basename: AST, ALT, ALKPHOS, BILITOT, PROT, ALBUMIN,  in the last 168 hours No results found for this basename: LIPASE, AMYLASE,  in the last 168 hours No results found for this basename: AMMONIA,  in the last 168 hours CBC:  Recent Labs Lab 05/30/13 1600  WBC 9.5  NEUTROABS 7.7  HGB 14.0  HCT 41.9  MCV 95.9  PLT 196   Cardiac Enzymes: No results found for this basename: CKTOTAL, CKMB, CKMBINDEX, TROPONINI,  in the last 168 hours  BNP (last 3 results) No results found for this basename: PROBNP,  in the last 8760 hours CBG: No results found for this basename: GLUCAP,  in the last 168 hours  Radiological Exams on Admission: Dg Chest 1 View  05/30/2013   CLINICAL DATA:  Fall.  Left hip pain.  EXAM: CHEST - 1 VIEW  COMPARISON:  None.  FINDINGS: Cardiopericardial silhouette within  normal limits. Aortic arch atherosclerosis. Monitoring leads project over the chest. No airspace disease. Subsegmental atelectasis or scarring near the costophrenic angle on the right. No effusion. No pneumothorax. Right glenohumeral osteoarthritis. Monitoring leads project over the chest.  IMPRESSION: No active disease.   Electronically Signed   By: Andreas NewportGeoffrey  Lamke M.D.   On: 05/30/2013 16:19   Dg Hip Complete Left  05/30/2013   CLINICAL DATA:  Fall.  Left hip pain.  EXAM: LEFT HIP - COMPLETE 2+ VIEW  COMPARISON:  None.  FINDINGS: Cervical left femoral neck fracture is present. Mild pre-existing left hip osteoarthritis is present. Femoral head remains located. Pelvic rings appear intact.  IMPRESSION: Cervical left femoral neck fracture.   Electronically Signed   By: Andreas Newport M.D.   On: 05/30/2013 16:18    EKG: Independently reviewed. NSR, No acute ST -T wave changes  Assessment/Plan    Femoral neck fracture -admit to Med-surg -Ortho Dr.Brooks consulted per EDP -IVF, pain control, start DVT proph post op -low cardiac risk    Parkinson disease -continue home regimen of Sinemet, Neupro, Aricept    HTN -continue Atenolol  GERD -continue Pepcid  DVT proph: SCDs for now, start anticoagulation post op  Code Status: Full Code Family Communication: d/w brother at bedside Disposition Plan: inpatient  Time spent:  Zannie Cove [facility] Pager [phone]

## 2013-05-30 NOTE — ED Provider Notes (Signed)
CSN: [id number]     Arrival date & time 05/30/13  1501 History   First MD Initiated Contact with Patient 05/30/13 1506     Chief Complaint  Patient presents with  . Hip Injury     (Consider location/radiation/quality/duration/timing/severity/associated sxs/prior Treatment) HPI Patient presents the ED with complaints of hip injury to the left.  He was brought in by EMS for a fall just prior to arrival. He miscalculated the stair and tripped stepping up. He has Parkinson's disease and a tremor which he says it at his baseline. He did not hit his head, injury to his neck or LOC, he does not take blood thinners. He was given medication en route and denies having pain so long as he does not move the leg. He describes the pain as being into the left hip. He is able to wiggle his toes. Denies any other symptoms.  Past Medical History  Diagnosis Date  . Allergy   . Hypertension   . GERD (gastroesophageal reflux disease)   . Hyperlipidemia   . Tremor   . BPH (benign prostatic hyperplasia)     followed by Dr. Evelene CroonWolff  . Renal stones    Past Surgical History  Procedure Laterality Date  . Esophagogastroduodenoscopy  01/1998  . Cystoscopy tumor / condylomata w/ laser  01/26/2001  . Emg  06/23/2001    NCL LE'S wnl, Dr. Kemper Durielarke  . Lithotripsy  10/2003    kidney stone, Dr. Artis FlockWolfe   Family History  Problem Relation Age of Onset  . Diabetes Mother   . Diabetes Sister   . Cancer Brother     prostate, terminal  . Prostate cancer Brother   . Hypertension Brother   . Hypertension Brother   . Hypertension Brother   . Depression Sister   . Cancer Sister     throat  . Heart disease Father   . Heart disease Brother   . Colon cancer Neg Hx    History  Substance Use Topics  . Smoking status: Never Smoker   . Smokeless tobacco: Not on file  . Alcohol Use: No    Review of Systems  Musculoskeletal: Positive for arthralgias and joint swelling.  Neurological: Positive for tremors.       Allergies  Review of patient's allergies indicates no known allergies.  Home Medications   Prior to Admission medications   Medication Sig Start Date End Date Taking? Authorizing Provider  atenolol (TENORMIN) 25 MG tablet TAKE 1 TABLET TWICE A DAY 05/06/13   Joaquim NamGraham S Duncan, MD  benztropine (COGENTIN) 0.5 MG tablet Take 0.5 mg by mouth 2 (two) times daily.      Historical Provider, MD  Carbidopa 25 MG tablet Take 25 mg by mouth 3 (three) times daily.    Historical Provider, MD  carbidopa-levodopa (SINEMET) 25-100 MG per tablet Take two  tablets by mouth 3 times a day.    Historical Provider, MD  cholecalciferol (VITAMIN D) 1000 UNITS tablet Take 1,000 Units by mouth daily.      Historical Provider, MD  esomeprazole (NEXIUM) 40 MG capsule Take 1 capsule (40 mg total) by mouth daily. 05/19/12   Eustaquio BoydenJavier Gutierrez, MD  guaiFENesin-codeine [facility](ROBITUSSIN AC) 100-10 MG/5ML syrup Take 5 mLs by mouth at bedtime as needed for cough. 05/08/12   Eustaquio BoydenJavier Gutierrez, MD  Multiple Vitamin (MULTIVITAMIN) tablet Take 1 tablet by mouth daily.    Historical Provider, MD  simvastatin (ZOCOR) 40 MG tablet TAKE 1 TABLET DAILY 05/21/12   Joaquim NamGraham S Duncan,  MD   BP 113/62  Pulse 54  Temp(Src) 97.8 F (36.6 C) (Oral)  Resp 18  SpO2 96% Physical Exam  Nursing note and vitals reviewed. Constitutional: He is oriented to person, place, and time. He appears well-developed and well-nourished. No distress.  HENT:  Head: Normocephalic and atraumatic.  Eyes: Pupils are equal, round, and reactive to light.  Neck: Normal range of motion. Neck supple. No spinous process tenderness and no muscular tenderness present.  Cardiovascular: Normal rate and regular rhythm.   Pulmonary/Chest: Effort normal.  Abdominal: Soft.  Musculoskeletal:       Left hip: He exhibits decreased range of motion, decreased strength and tenderness.       Legs: Tenderness to the left hip. Pt can not tolerate ROM of the hip. NO obvious deformity but  leg is externally rotated. He is able to move all 5 toes and has sensation to touch. Palpable femoral and pedal pulses. Skin is warm, moist, skin color is normal.  Neurological: He is alert and oriented to person, place, and time. He displays tremor (pt describes this tremor is at baseline).  Skin: Skin is warm and dry.    ED Course  Procedures (including critical care time) Labs Review Labs Reviewed  CBC WITH DIFFERENTIAL - Abnormal; Notable for the following:    Neutrophils Relative % 82 (*)    Lymphocytes Relative 9 (*)    All other components within normal limits  BASIC METABOLIC PANEL  PROTIME-INR  TYPE AND SCREEN  ABO/RH    Imaging Review Dg Chest 1 View  05/30/2013   CLINICAL DATA:  Fall.  Left hip pain.  EXAM: CHEST - 1 VIEW  COMPARISON:  None.  FINDINGS: Cardiopericardial silhouette within normal limits. Aortic arch atherosclerosis. Monitoring leads project over the chest. No airspace disease. Subsegmental atelectasis or scarring near the costophrenic angle on the right. No effusion. No pneumothorax. Right glenohumeral osteoarthritis. Monitoring leads project over the chest.  IMPRESSION: No active disease.   Electronically Signed   By: Andreas NewportGeoffrey  Lamke M.D.   On: 05/30/2013 16:19   Dg Hip Complete Left  05/30/2013   CLINICAL DATA:  Fall.  Left hip pain.  EXAM: LEFT HIP - COMPLETE 2+ VIEW  COMPARISON:  None.  FINDINGS: Cervical left femoral neck fracture is present. Mild pre-existing left hip osteoarthritis is present. Femoral head remains located. Pelvic rings appear intact.  IMPRESSION: Cervical left femoral neck fracture.   Electronically Signed   By: Andreas NewportGeoffrey  Lamke M.D.   On: 05/30/2013 16:18     EKG Interpretation   Date/Time:  Sunday May 30 2013 15:34:55 EDT Ventricular Rate:  62 PR Interval:  49 QRS Duration: 88 QT Interval:  431 QTC Calculation: 438 R Axis:   -3 Text Interpretation:  Normal sinus rhythm `no acute st/t changes Missing  lead(s): V6 No previous  tracing Confirmed by Denton LankSTEINL  MD, Caryn BeeKEVIN ([id number]) on  05/30/2013 3:52:06 PM      MDM   Final diagnoses:  Fracture of base of femoral neck (cervicotrochanteric) closed    ED Hip fracture protocol ordered. Patients pain being treated with IV abx and he has been NPO since 10 am this morning. He is in no acute distress. His hip xrays show a left femoral neck fracture.   Ortho consulted: On call Dr. Shon BatonBrooks has agreed to manage fracture Patient admitted to medicine.  Inpatient, [facility], [facility], Medsurg   Dorthula Matasiffany G Thelda Gagan, [place] JerseyPA-C 05/30/13 1655

## 2013-05-31 ENCOUNTER — Inpatient Hospital Stay (HOSPITAL_COMMUNITY): Payer: Medicare Other

## 2013-05-31 ENCOUNTER — Encounter (HOSPITAL_COMMUNITY): Payer: Self-pay | Admitting: Orthopedic Surgery

## 2013-05-31 ENCOUNTER — Inpatient Hospital Stay (HOSPITAL_COMMUNITY): Payer: Medicare Other | Admitting: Anesthesiology

## 2013-05-31 ENCOUNTER — Encounter (HOSPITAL_COMMUNITY)
Admission: EM | Disposition: A | Discharge status: Skilled Nursing Facility | Payer: Self-pay | Source: Home / Self Care | Attending: Orthopedic Surgery

## 2013-05-31 ENCOUNTER — Encounter (HOSPITAL_COMMUNITY): Payer: Medicare Other | Admitting: Anesthesiology

## 2013-05-31 DIAGNOSIS — I10 Essential (primary) hypertension: Secondary | ICD-10-CM

## 2013-05-31 DIAGNOSIS — S72009A Fracture of unspecified part of neck of unspecified femur, initial encounter for closed fracture: Secondary | ICD-10-CM | POA: Diagnosis present

## 2013-05-31 HISTORY — PX: HIP ARTHROPLASTY: SHX981

## 2013-05-31 LAB — BASIC METABOLIC PANEL
BUN: 24 mg/dL — ABNORMAL HIGH (ref 6–23)
CHLORIDE: 99 meq/L (ref 96–112)
CO2: 27 meq/L (ref 19–32)
CREATININE: 1.02 mg/dL (ref 0.50–1.35)
Calcium: 9.1 mg/dL (ref 8.4–10.5)
GFR calc Af Amer: 82 mL/min — ABNORMAL LOW (ref >90)
GFR calc non Af Amer: 70 mL/min — ABNORMAL LOW (ref >90)
Glucose, Bld: 122 mg/dL — ABNORMAL HIGH (ref 70–99)
Potassium: 4 mEq/L (ref 3.7–5.3)
Sodium: 138 mEq/L (ref 137–147)

## 2013-05-31 LAB — CBC
HEMATOCRIT: 41.2 % (ref 39.0–52.0)
Hemoglobin: 13.3 g/dL (ref 13.0–17.0)
MCH: 31 pg (ref 26.0–34.0)
MCHC: 32.3 g/dL (ref 30.0–36.0)
MCV: 96 fL (ref 78.0–100.0)
Platelets: 199 10*3/uL (ref 150–400)
RBC: 4.29 MIL/uL (ref 4.22–5.81)
RDW: 13.4 % (ref 11.5–15.5)
WBC: 8.8 10*3/uL (ref 4.0–10.5)

## 2013-05-31 LAB — PROTIME-INR
INR: 1.2 (ref 0.00–1.49)
Prothrombin Time: 14.9 seconds (ref 11.6–15.2)

## 2013-05-31 LAB — SURGICAL PCR SCREEN
MRSA, PCR: NEGATIVE
STAPHYLOCOCCUS AUREUS: POSITIVE — AB

## 2013-05-31 SURGERY — HEMIARTHROPLASTY, HIP, DIRECT ANTERIOR APPROACH, FOR FRACTURE
Anesthesia: General | Site: Hip | Laterality: Left

## 2013-05-31 MED ORDER — POLYETHYLENE GLYCOL 3350 17 G PO PACK: 17.0000 g | PACK | Freq: Every day | ORAL | Status: DC | PRN

## 2013-05-31 MED ORDER — LACTATED RINGERS IV SOLN: INTRAVENOUS | Status: DC

## 2013-05-31 MED ORDER — CEFAZOLIN SODIUM-DEXTROSE 2-3 GM-% IV SOLR
INTRAVENOUS | Status: DC | PRN
  Administered 2013-05-31: 2 g via INTRAVENOUS

## 2013-05-31 MED ORDER — FERROUS SULFATE 325 (65 FE) MG PO TABS
325.0000 mg | ORAL_TABLET | Freq: Three times a day (TID) | ORAL | Status: DC
  Administered 2013-06-01 – 2013-06-02 (×5): 325 mg via ORAL
  Filled 2013-05-31 (×7): qty 1

## 2013-05-31 MED ORDER — FENTANYL CITRATE 0.05 MG/ML IJ SOLN
INTRAMUSCULAR | Status: AC
  Filled 2013-05-31: qty 5

## 2013-05-31 MED ORDER — HYDROMORPHONE HCL PF 1 MG/ML IJ SOLN
0.2500 mg | INTRAMUSCULAR | Status: DC | PRN
  Administered 2013-05-31: 0.25 mg via INTRAVENOUS

## 2013-05-31 MED ORDER — LIDOCAINE HCL (CARDIAC) 20 MG/ML IV SOLN
INTRAVENOUS | Status: AC
  Filled 2013-05-31: qty 5

## 2013-05-31 MED ORDER — SENNA 8.6 MG PO TABS
1.0000 | ORAL_TABLET | Freq: Two times a day (BID) | ORAL | Status: DC
  Administered 2013-06-01 – 2013-06-02 (×3): 8.6 mg via ORAL

## 2013-05-31 MED ORDER — ONDANSETRON HCL 4 MG/2ML IJ SOLN
INTRAMUSCULAR | Status: AC
  Filled 2013-05-31: qty 2

## 2013-05-31 MED ORDER — EPHEDRINE SULFATE 50 MG/ML IJ SOLN
INTRAMUSCULAR | Status: DC | PRN
  Administered 2013-05-31 (×2): 10 mg via INTRAVENOUS

## 2013-05-31 MED ORDER — ASPIRIN EC 325 MG PO TBEC
325.0000 mg | DELAYED_RELEASE_TABLET | Freq: Two times a day (BID) | ORAL | Status: DC
  Administered 2013-06-01 – 2013-06-02 (×3): 325 mg via ORAL
  Filled 2013-05-31 (×5): qty 1

## 2013-05-31 MED ORDER — CEFAZOLIN SODIUM-DEXTROSE 2-3 GM-% IV SOLR
2.0000 g | Freq: Four times a day (QID) | INTRAVENOUS | Status: AC
  Administered 2013-06-01 (×2): 2 g via INTRAVENOUS
  Filled 2013-05-31 (×2): qty 50

## 2013-05-31 MED ORDER — MENTHOL 3 MG MT LOZG
1.0000 | LOZENGE | OROMUCOSAL | Status: DC | PRN
  Filled 2013-05-31: qty 9

## 2013-05-31 MED ORDER — ONDANSETRON HCL 4 MG/2ML IJ SOLN: 4.0000 mg | Freq: Four times a day (QID) | INTRAMUSCULAR | Status: DC | PRN

## 2013-05-31 MED ORDER — NEOSTIGMINE METHYLSULFATE 1 MG/ML IJ SOLN
INTRAMUSCULAR | Status: DC | PRN
  Administered 2013-05-31: 5 mg via INTRAVENOUS

## 2013-05-31 MED ORDER — MUPIROCIN 2 % EX OINT
TOPICAL_OINTMENT | Freq: Two times a day (BID) | CUTANEOUS | Status: DC
  Administered 2013-05-31 – 2013-06-02 (×6): via NASAL
  Filled 2013-05-31: qty 22

## 2013-05-31 MED ORDER — PROMETHAZINE HCL 25 MG/ML IJ SOLN: 6.2500 mg | INTRAMUSCULAR | Status: DC | PRN

## 2013-05-31 MED ORDER — ONDANSETRON HCL 4 MG/2ML IJ SOLN
INTRAMUSCULAR | Status: DC | PRN
  Administered 2013-05-31: 4 mg via INTRAVENOUS

## 2013-05-31 MED ORDER — DEXAMETHASONE SODIUM PHOSPHATE 10 MG/ML IJ SOLN
INTRAMUSCULAR | Status: DC | PRN
  Administered 2013-05-31: 10 mg via INTRAVENOUS

## 2013-05-31 MED ORDER — GLYCOPYRROLATE 0.2 MG/ML IJ SOLN
INTRAMUSCULAR | Status: AC
  Filled 2013-05-31: qty 3

## 2013-05-31 MED ORDER — SODIUM CHLORIDE 0.9 % IR SOLN
Status: DC | PRN
  Administered 2013-05-31: 1000 mL

## 2013-05-31 MED ORDER — HYDROMORPHONE HCL PF 1 MG/ML IJ SOLN
INTRAMUSCULAR | Status: AC
  Filled 2013-05-31: qty 1

## 2013-05-31 MED ORDER — CEFAZOLIN SODIUM-DEXTROSE 2-3 GM-% IV SOLR: 2.0000 g | INTRAVENOUS | Status: DC

## 2013-05-31 MED ORDER — GLYCOPYRROLATE 0.2 MG/ML IJ SOLN
INTRAMUSCULAR | Status: DC | PRN
  Administered 2013-05-31: 0.6 mg via INTRAVENOUS

## 2013-05-31 MED ORDER — CEFAZOLIN SODIUM 1 G IJ SOLR: 2.0000 g | Freq: Three times a day (TID) | INTRAMUSCULAR | Status: DC

## 2013-05-31 MED ORDER — METHOCARBAMOL 500 MG PO TABS
500.0000 mg | ORAL_TABLET | Freq: Four times a day (QID) | ORAL | Status: DC | PRN
  Administered 2013-06-01: 500 mg via ORAL
  Filled 2013-05-31: qty 1

## 2013-05-31 MED ORDER — DOCUSATE SODIUM 100 MG PO CAPS
100.0000 mg | ORAL_CAPSULE | Freq: Two times a day (BID) | ORAL | Status: DC
  Administered 2013-06-01 – 2013-06-02 (×3): 100 mg via ORAL

## 2013-05-31 MED ORDER — SODIUM CHLORIDE 0.9 % IV SOLN
INTRAVENOUS | Status: DC
  Administered 2013-06-01: 10:00:00 via INTRAVENOUS
  Filled 2013-05-31 (×5): qty 1000

## 2013-05-31 MED ORDER — LIDOCAINE HCL (CARDIAC) 20 MG/ML IV SOLN
INTRAVENOUS | Status: DC | PRN
  Administered 2013-05-31: 100 mg via INTRAVENOUS

## 2013-05-31 MED ORDER — DEXAMETHASONE SODIUM PHOSPHATE 10 MG/ML IJ SOLN
INTRAMUSCULAR | Status: AC
  Filled 2013-05-31: qty 1

## 2013-05-31 MED ORDER — ROCURONIUM BROMIDE 100 MG/10ML IV SOLN
INTRAVENOUS | Status: DC | PRN
  Administered 2013-05-31: 25 mg via INTRAVENOUS

## 2013-05-31 MED ORDER — 0.9 % SODIUM CHLORIDE (POUR BTL) OPTIME
TOPICAL | Status: DC | PRN
  Administered 2013-05-31: 1000 mL

## 2013-05-31 MED ORDER — LACTATED RINGERS IV SOLN
INTRAVENOUS | Status: DC | PRN
  Administered 2013-05-31: 19:00:00 via INTRAVENOUS

## 2013-05-31 MED ORDER — SUCCINYLCHOLINE CHLORIDE 20 MG/ML IJ SOLN
INTRAMUSCULAR | Status: DC | PRN
  Administered 2013-05-31: 100 mg via INTRAVENOUS

## 2013-05-31 MED ORDER — HYDROMORPHONE HCL PF 1 MG/ML IJ SOLN: 0.5000 mg | INTRAMUSCULAR | Status: DC | PRN

## 2013-05-31 MED ORDER — ALUMINUM HYDROXIDE GEL 320 MG/5ML PO SUSP
15.0000 mL | ORAL | Status: DC | PRN
  Filled 2013-05-31: qty 30

## 2013-05-31 MED ORDER — DIPHENHYDRAMINE HCL 12.5 MG/5ML PO ELIX: 25.0000 mg | ORAL_SOLUTION | Freq: Four times a day (QID) | ORAL | Status: DC | PRN

## 2013-05-31 MED ORDER — PHENOL 1.4 % MT LIQD: 1.0000 | OROMUCOSAL | Status: DC | PRN

## 2013-05-31 MED ORDER — PROPOFOL 10 MG/ML IV BOLUS
INTRAVENOUS | Status: AC
  Filled 2013-05-31: qty 20

## 2013-05-31 MED ORDER — HYDROCODONE-ACETAMINOPHEN 7.5-325 MG PO TABS
1.0000 | ORAL_TABLET | ORAL | Status: DC | PRN
  Administered 2013-06-01 – 2013-06-02 (×5): 1 via ORAL
  Filled 2013-05-31 (×5): qty 1

## 2013-05-31 MED ORDER — FENTANYL CITRATE 0.05 MG/ML IJ SOLN
INTRAMUSCULAR | Status: DC | PRN
  Administered 2013-05-31: 25 ug via INTRAVENOUS
  Administered 2013-05-31 (×2): 50 ug via INTRAVENOUS
  Administered 2013-05-31: 25 ug via INTRAVENOUS

## 2013-05-31 MED ORDER — CEFAZOLIN SODIUM-DEXTROSE 2-3 GM-% IV SOLR
INTRAVENOUS | Status: AC
  Filled 2013-05-31: qty 50

## 2013-05-31 MED ORDER — ROCURONIUM BROMIDE 100 MG/10ML IV SOLN
INTRAVENOUS | Status: AC
  Filled 2013-05-31: qty 1

## 2013-05-31 MED ORDER — PROPOFOL 10 MG/ML IV BOLUS
INTRAVENOUS | Status: DC | PRN
  Administered 2013-05-31: 120 mg via INTRAVENOUS

## 2013-05-31 MED ORDER — DEXTROSE 5 % IV SOLN
500.0000 mg | Freq: Four times a day (QID) | INTRAVENOUS | Status: DC | PRN
  Administered 2013-05-31: 500 mg via INTRAVENOUS
  Filled 2013-05-31: qty 5

## 2013-05-31 MED ORDER — ONDANSETRON HCL 4 MG PO TABS: 4.0000 mg | ORAL_TABLET | Freq: Four times a day (QID) | ORAL | Status: DC | PRN

## 2013-05-31 SURGICAL SUPPLY — 54 items
BAG ZIPLOCK 12X15 (MISCELLANEOUS) IMPLANT
BLADE SAW SGTL 18X1.27X75 (BLADE) ×2 IMPLANT
BLADE SAW SGTL 18X1.27X75MM (BLADE) ×1
CAPT HIP HD POR BIPOL/UNIPOL ×3 IMPLANT
CLOSURE WOUND 1/2 X4 (GAUZE/BANDAGES/DRESSINGS)
DERMABOND ADVANCED (GAUZE/BANDAGES/DRESSINGS) ×2
DERMABOND ADVANCED .7 DNX12 (GAUZE/BANDAGES/DRESSINGS) ×1 IMPLANT
DRAPE INCISE IOBAN 85X60 (DRAPES) ×3 IMPLANT
DRAPE ORTHO SPLIT 77X108 STRL (DRAPES) ×4
DRAPE POUCH INSTRU U-SHP 10X18 (DRAPES) ×3 IMPLANT
DRAPE SURG 17X11 SM STRL (DRAPES) ×3 IMPLANT
DRAPE SURG ORHT 6 SPLT 77X108 (DRAPES) ×2 IMPLANT
DRAPE U-SHAPE 47X51 STRL (DRAPES) ×3 IMPLANT
DRSG AQUACEL AG ADV 3.5X10 (GAUZE/BANDAGES/DRESSINGS) ×3 IMPLANT
DRSG TEGADERM 4X4.75 (GAUZE/BANDAGES/DRESSINGS) IMPLANT
DURAPREP 26ML APPLICATOR (WOUND CARE) ×3 IMPLANT
ELECT BLADE TIP CTD 4 INCH (ELECTRODE) ×3 IMPLANT
ELECT REM PT RETURN 9FT ADLT (ELECTROSURGICAL) ×3
ELECTRODE REM PT RTRN 9FT ADLT (ELECTROSURGICAL) ×1 IMPLANT
EVACUATOR 1/8 PVC DRAIN (DRAIN) IMPLANT
FACESHIELD WRAPAROUND (MASK) ×9 IMPLANT
GAUZE SPONGE 2X2 8PLY STRL LF (GAUZE/BANDAGES/DRESSINGS) IMPLANT
GLOVE BIOGEL PI IND STRL 7.5 (GLOVE) ×2 IMPLANT
GLOVE BIOGEL PI IND STRL 8 (GLOVE) ×1 IMPLANT
GLOVE BIOGEL PI INDICATOR 7.5 (GLOVE) ×4
GLOVE BIOGEL PI INDICATOR 8 (GLOVE) ×2
GLOVE ECLIPSE 8.0 STRL XLNG CF (GLOVE) ×3 IMPLANT
GLOVE ORTHO TXT STRL SZ7.5 (GLOVE) ×6 IMPLANT
GLOVE SURG ORTHO 8.0 STRL STRW (GLOVE) ×3 IMPLANT
GLOVE SURG SS PI 7.5 STRL IVOR (GLOVE) ×3 IMPLANT
GOWN SPEC L3 XXLG W/TWL (GOWN DISPOSABLE) IMPLANT
GOWN STRL REUS W/ TWL XL LVL3 (GOWN DISPOSABLE) ×2 IMPLANT
GOWN STRL REUS W/TWL LRG LVL3 (GOWN DISPOSABLE) ×3 IMPLANT
GOWN STRL REUS W/TWL XL LVL3 (GOWN DISPOSABLE) ×4
HANDPIECE INTERPULSE COAX TIP (DISPOSABLE) ×2
IMMOBILIZER KNEE 20 (SOFTGOODS) ×3 IMPLANT
IV NS 1000ML (IV SOLUTION) ×2
IV NS 1000ML BAXH (IV SOLUTION) ×1 IMPLANT
KIT BASIN OR (CUSTOM PROCEDURE TRAY) ×3 IMPLANT
MANIFOLD NEPTUNE II (INSTRUMENTS) ×3 IMPLANT
PACK TOTAL JOINT (CUSTOM PROCEDURE TRAY) ×3 IMPLANT
POSITIONER SURGICAL ARM (MISCELLANEOUS) ×3 IMPLANT
SET HNDPC FAN SPRY TIP SCT (DISPOSABLE) ×1 IMPLANT
SPONGE GAUZE 2X2 STER 10/PKG (GAUZE/BANDAGES/DRESSINGS)
STRIP CLOSURE SKIN 1/2X4 (GAUZE/BANDAGES/DRESSINGS) IMPLANT
SUT ETHIBOND NAB CT1 #1 30IN (SUTURE) ×3 IMPLANT
SUT MNCRL AB 3-0 PS2 18 (SUTURE) ×3 IMPLANT
SUT MNCRL AB 4-0 PS2 18 (SUTURE) IMPLANT
SUT VIC AB 1 CT1 36 (SUTURE) ×6 IMPLANT
SUT VIC AB 2-0 CT1 27 (SUTURE) ×4
SUT VIC AB 2-0 CT1 TAPERPNT 27 (SUTURE) ×2 IMPLANT
SUT VLOC 180 0 24IN GS25 (SUTURE) ×3 IMPLANT
TOWEL OR 17X26 10 PK STRL BLUE (TOWEL DISPOSABLE) ×6 IMPLANT
TRAY FOLEY CATH 14FRSI W/METER (CATHETERS) IMPLANT

## 2013-05-31 NOTE — Anesthesia Postprocedure Evaluation (Signed)
Anesthesia Post Note  Patient: Hunter Willis  Procedure(s) Performed: Procedure(s) (LRB): LEFT HIP HEMIARTHROPLASTY (Left)  Anesthesia type: General  Patient location: PACU  Post pain: Pain level controlled  Post assessment: Post-op Vital signs reviewed  Last Vitals:  Filed Vitals:   05/31/13 2100  BP: 177/84  Pulse: 100  Temp:   Resp: 19    Post vital signs: Reviewed  Level of consciousness: sedated  Complications: No apparent anesthesia complications

## 2013-05-31 NOTE — Progress Notes (Addendum)
Clinical Social Work Department BRIEF PSYCHOSOCIAL ASSESSMENT 05/31/2013  Patient:  Hunter Willis, Hunter Willis     Account Number:  [id number]     Admit date:  05/30/2013  Clinical Social Worker:  Lacie Scotts  Date/Time:  05/31/2013 03:12 PM  Referred by:  Physician  Date Referred:  05/31/2013  Other Referral:   Interview type:  Patient Other interview type:    PSYCHOSOCIAL DATA Living Status: SPOUSE  Admitted from facility:   Level of care:   Primary support name:  [name]  Primary support relationship to patient:  Spouse  Degree of support available:   supportive    CURRENT CONCERNS Current Concerns  Post-Acute Placement   Other Concerns:    SOCIAL WORK ASSESSMENT / PLAN Pt is a 73 yr old gentleman admitted to [facility] after falling at home and fx his hip. Pt is scheduled for surgery tonite. CSW met with pt / family to assist with d/c planning. ST Rehab will be needed following hospital d/c. Pt would like to go to [facility] CSW has contacted SNF and a decision is pending. CSW will continue to follow to assist with d/c planning needs.   Assessment/plan status:  Psychosocial Support/Ongoing Assessment of Needs Other assessment/ plan:   Information/referral to community resources:   Insurance coverage for SNF and ambulance transport reviewed.    PATIENT'S/FAMILY'S RESPONSE TO PLAN OF CARE: Pt / family are hopeful that [facility] will have an opening. " That's so close to home. We'll be able to visit often. " Pt / family appreciated CSW assistance.    Werner Lean LCSW [phone]

## 2013-05-31 NOTE — Progress Notes (Signed)
    Subjective:     Patient reports pain as 5 on 0-10 scale.   Denies CP or SOB.  Voiding without difficulty. Positive flatus. Objective: Vital signs in last 24 hours: Temp:  [97.4 F (36.3 C)-98 F (36.7 C)] 98 F (36.7 C) (04/26 2100) Pulse Rate:  [54-73] 72 (04/26 2100) Resp:  [16-20] 18 (04/26 2100) BP: (113-155)/(62-88) 155/65 mmHg (04/26 2100) SpO2:  [94 %-99 %] 95 % (04/26 2100) Weight:  [190 lb (86.183 kg)] 190 lb (86.183 kg) (04/26 1856)  Intake/Output from previous day: 04/26 0701 - 04/27 0700 In: 600 [P.O.:600] Out: 850 [Urine:850] Intake/Output this shift:    Labs:  Recent Labs  05/30/13 1600 05/31/13 0422  HGB 14.0 13.3    Recent Labs  05/30/13 1600 05/31/13 0422  WBC 9.5 8.8  RBC 4.37 4.29  HCT 41.9 41.2  PLT 196 199    Recent Labs  05/30/13 1600 05/31/13 0422  NA 139 138  K 4.4 4.0  CL 99 99  CO2 29 27  BUN 29* 24*  CREATININE 0.93 1.02  GLUCOSE 99 122*  CALCIUM 10.1 9.1    Recent Labs  05/30/13 1600 05/31/13 0422  INR 1.13 1.20    Physical Exam: ABD soft Intact pulses distally Compartment soft  Assessment/Plan: NPO Spoke with Dr Charlann Boxerlin will take over ortho care of patient Confirm with medical team patient is cleared for surgery   Dr. Venita Lickahari Lemuel Boodram [facility] [phone] 05/31/2013, 7:21 AM

## 2013-05-31 NOTE — Anesthesia Preprocedure Evaluation (Signed)
Anesthesia Evaluation  Patient identified by MRN, date of birth, ID band Patient awake    Reviewed: Allergy & Precautions, H&P , NPO status , Patient's Chart, lab work & pertinent test results  Airway Mallampati: II TM Distance: <3 FB   Mouth opening: Limited Mouth Opening  Dental  (+) Teeth Intact, Dental Advisory Given, Caps   Pulmonary neg pulmonary ROS,  breath sounds clear to auscultation  Pulmonary exam normal       Cardiovascular hypertension, Pt. on medications and Pt. on home beta blockers negative cardio ROS  Rhythm:Regular Rate:Normal     Neuro/Psych Parkinsons Disease negative neurological ROS  negative psych ROS   GI/Hepatic Neg liver ROS, GERD-  ,  Endo/Other  negative endocrine ROS  Renal/GU negative Renal ROS  negative genitourinary   Musculoskeletal  (+) Arthritis -,   Abdominal   Peds  Hematology negative hematology ROS (+)   Anesthesia Other Findings   Reproductive/Obstetrics                           Anesthesia Physical Anesthesia Plan  ASA: III  Anesthesia Plan: General   Post-op Pain Management:    Induction: Intravenous  Airway Management Planned: Oral ETT  Additional Equipment:   Intra-op Plan:   Post-operative Plan: Extubation in OR  Informed Consent: I have reviewed the patients History and Physical, chart, labs and discussed the procedure including the risks, benefits and alternatives for the proposed anesthesia with the patient or authorized representative who has indicated his/her understanding and acceptance.   Dental advisory given  Plan Discussed with: CRNA  Anesthesia Plan Comments:         Anesthesia Quick Evaluation

## 2013-05-31 NOTE — Progress Notes (Signed)
I was asked to assume care of this pleasant 73 yo male with Parkinson's who unfortunately had a fall from stairs at his house last evening. I have examined the patient and reviewed his X-rays.  Due to the displaced left femoral neck fracture I have recommended that he have a left hip hemiarthroplasty to address this injury.  Risks and benefits reviewed.  Consent signed. NPO Ancef on call to the OR

## 2013-05-31 NOTE — Progress Notes (Signed)
[facility] PROGRESS NOTE  Hunter RecordsJerry D Willis ZOX:[id number] DOB: 12/21/1940 DOA: 05/30/2013 PCP: Crawford GivensGraham Duncan, MD  Assessment/Plan: #1 femoral neck fracture Patient states pain is controlled. Patient has been seen by orthopedics and patient possibly to the operating room today for repair. Continue IV fluids. Continue pain management per orthopedics. Call to following and appreciate input and recommendations.  #2 Parkinson's disease Stable. Continue Sinemet, Cogentin, Aricept.  #3 hypertension Stable. Continue atenolol.  #4 gastroesophageal reflux disease Pepcid.  #5 prophylaxis Pepcid for GI prophylaxis, SCDs for DVT prophylaxis, then postoperatively per orthopedics.  Code Status: Full Family Communication: Updated patient no family at bedside. Disposition Plan: Home versus skilled nursing facility when medically stable.   Consultants:  Orthopedics: Dr. Shon BatonBrooks 05/31/2013  Procedures:  X-ray of the wrist 05/30/2013  Chest x-ray 05/30/2013  X-ray of the left hip 05/30/2013  Antibiotics:  None  HPI/Subjective: Patient with some complaints of left hip pain. Patient denies any chest pain. No shortness of breath.  Objective: Filed Vitals:   05/30/13 2100  BP: 155/65  Pulse: 72  Temp: 98 F (36.7 C)  Resp: 18    Intake/Output Summary (Last 24 hours) at 05/31/13 0859 Last data filed at 05/31/13 0851  Gross per 24 hour  Intake    600 ml  Output   1025 ml  Net   -425 ml   Filed Weights   05/30/13 1856  Weight: 86.183 kg (190 lb)    Exam:   General:  NAD. Tremors noted.  Cardiovascular: Regular rate rhythm no murmurs rubs or gallops.  Respiratory: Clear to auscultation anterior lung fields.  Abdomen: Soft, nontender, nondistended, positive bowel sounds.  Musculoskeletal: No clubbing cyanosis or edema.  Data Reviewed: Basic Metabolic Panel:  Recent Labs Lab 05/30/13 1600 05/31/13 0422  NA 139 138  K 4.4 4.0  CL 99 99  CO2 29 27   GLUCOSE 99 122*  BUN 29* 24*  CREATININE 0.93 1.02  CALCIUM 10.1 9.1   Liver Function Tests: No results found for this basename: AST, ALT, ALKPHOS, BILITOT, PROT, ALBUMIN,  in the last 168 hours No results found for this basename: LIPASE, AMYLASE,  in the last 168 hours No results found for this basename: AMMONIA,  in the last 168 hours CBC:  Recent Labs Lab 05/30/13 1600 05/31/13 0422  WBC 9.5 8.8  NEUTROABS 7.7  --   HGB 14.0 13.3  HCT 41.9 41.2  MCV 95.9 96.0  PLT 196 199   Cardiac Enzymes: No results found for this basename: CKTOTAL, CKMB, CKMBINDEX, TROPONINI,  in the last 168 hours BNP (last 3 results) No results found for this basename: PROBNP,  in the last 8760 hours CBG: No results found for this basename: GLUCAP,  in the last 168 hours  Recent Results (from the past 240 hour(s))  SURGICAL PCR SCREEN     Status: Abnormal   Collection Time    05/30/13  8:23 PM      Result Value Ref Range Status   MRSA, PCR NEGATIVE  NEGATIVE Final   Staphylococcus aureus POSITIVE (*) NEGATIVE Final   Comment:            The Xpert SA Assay (FDA     approved for NASAL specimens     in patients over 75 years of age),     is one component of     a comprehensive surveillance     program.  Test performance has     been validated by [facility] for patients greater     than or equal to 106108 year old.     It is not intended     to diagnose infection nor to     guide or monitor treatment.     Studies: Dg Chest 1 View  05/30/2013   CLINICAL DATA:  Fall.  Left hip pain.  EXAM: CHEST - 1 VIEW  COMPARISON:  None.  FINDINGS: Cardiopericardial silhouette within normal limits. Aortic arch atherosclerosis. Monitoring leads project over the chest. No airspace disease. Subsegmental atelectasis or scarring near the costophrenic angle on the right. No effusion. No pneumothorax. Right glenohumeral osteoarthritis. Monitoring leads project over the chest.  IMPRESSION: No active disease.    Electronically Signed   By: Andreas NewportGeoffrey  Lamke M.D.   On: 05/30/2013 16:19   Dg Wrist Complete Left  05/30/2013   CLINICAL DATA:  Left wrist pain status post fall.  EXAM: LEFT WRIST - COMPLETE 3+ VIEW  COMPARISON:  None.  FINDINGS: The bones are diffusely osteopenic. The distal radius and ulna appear intact. The carpal bones appear intact. No dislocation is demonstrated. Mild intercarpal joint degenerative change is present. The metacarpals appear normal where visualized. There is very mild soft tissue swelling over the wrist. IV tubing is present.  IMPRESSION: No acute displaced fracture of the left wrist is demonstrated. The study is limited however due to diffuse osteopenia. If the patient's symptoms persist and remain unexplained or if there is a high clinical suspicion of an occult fracture, CT scanning would be the most useful next imaging step.   Electronically Signed   By: David  [place]azil[place]   On: 05/30/2013 18:42   Dg Hip Complete Left  05/30/2013   CLINICAL DATA:  Fall.  Left hip pain.  EXAM: LEFT HIP - COMPLETE 2+ VIEW  COMPARISON:  None.  FINDINGS: Cervical left femoral neck fracture is present. Mild pre-existing left hip osteoarthritis is present. Femoral head remains located. Pelvic rings appear intact.  IMPRESSION: Cervical left femoral neck fracture.   Electronically Signed   By: Andreas NewportGeoffrey  Lamke M.D.   On: 05/30/2013 16:18    Scheduled Meds: . atenolol  25 mg Oral BID  . benztropine  0.5 mg Oral BID  . Carbidopa  25 mg Oral TID  . carbidopa-levodopa  1 tablet Oral TID  . cholecalciferol  1,000 Units Oral Daily  . donepezil  5 mg Oral QHS  . famotidine  10 mg Oral Daily  . glycopyrrolate  1 mg Oral BID  . multivitamin with minerals  1 tablet Oral Daily  . mupirocin ointment   Nasal BID  . Rotigotine  8 mg Transdermal Daily  . simvastatin  40 mg Oral Daily   Continuous Infusions: . sodium chloride 1,000 mL (05/30/13 2100)    Principal Problem:   Femoral neck fracture Active  Problems:   HYPERTENSION   GERD   BENIGN PROSTATIC HYPERTROPHY, HX OF   Hyperlipidemia   Hip fracture   Parkinson disease    Time spent: 30 minutes    Rodolph Bonganiel V Teigen Bellin M.D. [facility] Pager [phone]. If 7PM-7AM, please contact night-coverage at [email or web address], password [email or web address] 05/31/2013, 8:59 AM  LOS: 1 day

## 2013-05-31 NOTE — Transfer of Care (Signed)
Immediate Anesthesia Transfer of Care Note  Patient: Hunter Willis  Procedure(s) Performed: Procedure(s) (LRB): LEFT HIP HEMIARTHROPLASTY (Left)  Patient Location: PACU  Anesthesia Type: General  Level of Consciousness: sedated, patient cooperative and responds to stimulation  Airway & Oxygen Therapy: Patient Spontanous Breathing and Patient connected to face mask oxgen  Post-op Assessment: Report given to PACU RN and Post -op Vital signs reviewed and stable  Post vital signs: Reviewed and stable  Complications: No apparent anesthesia complications

## 2013-05-31 NOTE — Progress Notes (Signed)
Clinical Social Work Department CLINICAL SOCIAL WORK PLACEMENT NOTE 05/31/2013  Patient:  Hunter Willis,Hunter Willis  Account Number:  [id number] Admit date:  05/30/2013  Clinical Social Worker:  Cori RazorJAMIE Tylia Ewell, LCSW  Date/time:  05/31/2013 03:25 PM  Clinical Social Work is seeking post-discharge placement for this patient at the following level of care:   SKILLED NURSING   (*CSW will update this form in Epic as items are completed)   05/31/2013  Patient/family provided with [facility] of [facility] Work's list of facilities offering this level of care within the geographic area requested by the patient (or if unable, by the patient's family).    Patient/family informed of their freedom to choose among providers that offer the needed level of care, that participate in Medicare, Medicaid or managed care program needed by the patient, have an available bed and are willing to accept the patient.  05/31/2013  Patient/family informed of [facility]' ownership interest in [facility], as well as of the fact that they are under no obligation to receive care at this facility.  PASARR submitted to EDS on 05/31/2013 PASARR number received from EDS on 05/31/2013  FL2 transmitted to all facilities in geographic area requested by pt/family on  05/31/2013 FL2 transmitted to all facilities within larger geographic area on   Patient informed that his/her managed care company has contracts with or will negotiate with  certain facilities, including the following:     Patient/family informed of bed offers received:   Patient chooses bed at  Physician recommends and patient chooses bed at    Patient to be transferred to  on   Patient to be transferred to facility by   The following physician request were entered in Epic:   Additional Comments:  Cori RazorJamie Dashawn Bartnick LCSW [phone]

## 2013-06-01 ENCOUNTER — Encounter (HOSPITAL_COMMUNITY): Payer: Self-pay | Admitting: Orthopedic Surgery

## 2013-06-01 MED ORDER — ASPIRIN EC 325 MG PO TBEC: 325.0000 mg | DELAYED_RELEASE_TABLET | Freq: Two times a day (BID) | ORAL | Status: DC

## 2013-06-01 MED ORDER — POLYETHYLENE GLYCOL 3350 17 G PO PACK: 17.0000 g | PACK | Freq: Every day | ORAL | Status: DC | PRN

## 2013-06-01 MED ORDER — HYDROCODONE-ACETAMINOPHEN 7.5-325 MG PO TABS: 1.0000 | ORAL_TABLET | ORAL | Status: DC | PRN

## 2013-06-01 MED ORDER — SODIUM CHLORIDE 0.9 % IV SOLN: INTRAVENOUS | Status: DC

## 2013-06-01 MED ORDER — METHOCARBAMOL 500 MG PO TABS: 500.0000 mg | ORAL_TABLET | Freq: Four times a day (QID) | ORAL | Status: DC | PRN

## 2013-06-01 MED ORDER — DSS 100 MG PO CAPS: 100.0000 mg | ORAL_CAPSULE | Freq: Two times a day (BID) | ORAL | Status: DC

## 2013-06-01 NOTE — Progress Notes (Addendum)
Subjective: 1 Day Post-Op Procedure(s) (LRB): LEFT HIP HEMIARTHROPLASTY (Left) Patient reports pain as moderate.    Objective: Vital signs in last 24 hours: Temp:  [97.2 F (36.2 C)-98.9 F (37.2 C)] 98.3 F (36.8 C) (04/28 16100637) Pulse Rate:  [54-137] 97 (04/28 0637) Resp:  [16-21] 18 (04/28 0800) BP: (142-185)/(66-95) 144/86 mmHg (04/28 0637) SpO2:  [94 %-100 %] 97 % (04/28 0637)  Intake/Output from previous day: 04/27 0701 - 04/28 0700 In: 3623.8 [P.O.:240; I.V.:3383.8] Out: 975 [Urine:825; Blood:150] Intake/Output this shift:     Recent Labs  05/30/13 1600 05/31/13 0422  HGB 14.0 13.3    Recent Labs  05/30/13 1600 05/31/13 0422  WBC 9.5 8.8  RBC 4.37 4.29  HCT 41.9 41.2  PLT 196 199    Recent Labs  05/30/13 1600 05/31/13 0422  NA 139 138  K 4.4 4.0  CL 99 99  CO2 29 27  BUN 29* 24*  CREATININE 0.93 1.02  GLUCOSE 99 122*  CALCIUM 10.1 9.1    Recent Labs  05/30/13 1600 05/31/13 0422  INR 1.13 1.20    ABD soft Neurovascular intact Compartment soft Dressing intact  Assessment/Plan: 1 Day Post-Op Procedure(s) (LRB): LEFT HIP HEMIARTHROPLASTY (Left) Advance diet Up with therapy  Clarene CritchleyJane B Kayron Kalmar 06/01/2013, 8:27 AM Addendum weight bearing as tolerated

## 2013-06-01 NOTE — Progress Notes (Signed)
[facility] PROGRESS NOTE  Lyn RecordsJerry D Sapp ZOX:[id number] DOB: 07/22/1940 DOA: 05/30/2013 PCP: Crawford GivensGraham Duncan, MD  Assessment/Plan: #1 femoral neck fracture Patient states pain is controlled. Patient status post left hip hemiarthroplasty 05/31/2013 per Dr. Charlann Boxerlin. Saline lock IV fluids. Continue pain management per orthopedics. Per orthopedics.   #2 Parkinson's disease Stable. Continue Sinemet, Cogentin, Aricept.  #3 hypertension Stable. Continue atenolol.  #4 gastroesophageal reflux disease Pepcid.  #5 prophylaxis Pepcid for GI prophylaxis, SCDs for DVT prophylaxis, then postoperatively per orthopedics.  Code Status: Full Family Communication: Updated patient no family at bedside. Disposition Plan: skilled nursing facility when medically stable.   Consultants:  Orthopedics: Dr. Shon BatonBrooks 05/31/2013  Procedures:  X-ray of the wrist 05/30/2013  Chest x-ray 05/30/2013  X-ray of the left hip 05/30/2013  Left hip hemiarthroplasty per Dr. Charlann Boxerlin 06/01/2013  Antibiotics:  None  HPI/Subjective: Patient states hip pain improved. Patient denies any chest pain. No shortness of breath.  Objective: Filed Vitals:   06/01/13 1148  BP:   Pulse:   Temp:   Resp: 18    Intake/Output Summary (Last 24 hours) at 06/01/13 1204 Last data filed at 06/01/13 0945  Gross per 24 hour  Intake 2586.25 ml  Output    700 ml  Net 1886.25 ml   Filed Weights   05/30/13 1856  Weight: 86.183 kg (190 lb)    Exam:   General:  NAD. Tremors noted.  Cardiovascular: Regular rate rhythm no murmurs rubs or gallops.  Respiratory: Clear to auscultation anterior lung fields.  Abdomen: Soft, nontender, nondistended, positive bowel sounds.  Musculoskeletal: No clubbing cyanosis or edema.  Data Reviewed: Basic Metabolic Panel:  Recent Labs Lab 05/30/13 1600 05/31/13 0422  NA 139 138  K 4.4 4.0  CL 99 99  CO2 29 27  GLUCOSE 99 122*  BUN 29* 24*  CREATININE 0.93 1.02  CALCIUM  10.1 9.1   Liver Function Tests: No results found for this basename: AST, ALT, ALKPHOS, BILITOT, PROT, ALBUMIN,  in the last 168 hours No results found for this basename: LIPASE, AMYLASE,  in the last 168 hours No results found for this basename: AMMONIA,  in the last 168 hours CBC:  Recent Labs Lab 05/30/13 1600 05/31/13 0422  WBC 9.5 8.8  NEUTROABS 7.7  --   HGB 14.0 13.3  HCT 41.9 41.2  MCV 95.9 96.0  PLT 196 199   Cardiac Enzymes: No results found for this basename: CKTOTAL, CKMB, CKMBINDEX, TROPONINI,  in the last 168 hours BNP (last 3 results) No results found for this basename: PROBNP,  in the last 8760 hours CBG: No results found for this basename: GLUCAP,  in the last 168 hours  Recent Results (from the past 240 hour(s))  SURGICAL PCR SCREEN     Status: Abnormal   Collection Time    05/30/13  8:23 PM      Result Value Ref Range Status   MRSA, PCR NEGATIVE  NEGATIVE Final   Staphylococcus aureus POSITIVE (*) NEGATIVE Final   Comment:            The Xpert SA Assay (FDA     approved for NASAL specimens     in patients over 75 years of age),     is one component of     a comprehensive surveillance     program.  Test performance has     been validated by [facility] for patients greater     than or equal to  29 year old.     It is not intended     to diagnose infection nor to     guide or monitor treatment.     Studies: Dg Chest 1 View  05/30/2013   CLINICAL DATA:  Fall.  Left hip pain.  EXAM: CHEST - 1 VIEW  COMPARISON:  None.  FINDINGS: Cardiopericardial silhouette within normal limits. Aortic arch atherosclerosis. Monitoring leads project over the chest. No airspace disease. Subsegmental atelectasis or scarring near the costophrenic angle on the right. No effusion. No pneumothorax. Right glenohumeral osteoarthritis. Monitoring leads project over the chest.  IMPRESSION: No active disease.   Electronically Signed   By: Andreas Newport M.D.   On: 05/30/2013  16:19   Dg Wrist Complete Left  05/30/2013   CLINICAL DATA:  Left wrist pain status post fall.  EXAM: LEFT WRIST - COMPLETE 3+ VIEW  COMPARISON:  None.  FINDINGS: The bones are diffusely osteopenic. The distal radius and ulna appear intact. The carpal bones appear intact. No dislocation is demonstrated. Mild intercarpal joint degenerative change is present. The metacarpals appear normal where visualized. There is very mild soft tissue swelling over the wrist. IV tubing is present.  IMPRESSION: No acute displaced fracture of the left wrist is demonstrated. The study is limited however due to diffuse osteopenia. If the patient's symptoms persist and remain unexplained or if there is a high clinical suspicion of an occult fracture, CT scanning would be the most useful next imaging step.   Electronically Signed   By: David  [place]   On: 05/30/2013 18:42   Dg Hip Complete Left  05/30/2013   CLINICAL DATA:  Fall.  Left hip pain.  EXAM: LEFT HIP - COMPLETE 2+ VIEW  COMPARISON:  None.  FINDINGS: Cervical left femoral neck fracture is present. Mild pre-existing left hip osteoarthritis is present. Femoral head remains located. Pelvic rings appear intact.  IMPRESSION: Cervical left femoral neck fracture.   Electronically Signed   By: Andreas Newport M.D.   On: 05/30/2013 16:18   Dg Pelvis Portable  05/31/2013   CLINICAL DATA:  Postop left hip  EXAM: PORTABLE PELVIS 1-2 VIEWS  COMPARISON:  DG HIP COMPLETE*L* dated 05/30/2013  FINDINGS: Interval left hip arthroplasty without failure or complication. No dislocation. Postsurgical changes are noted in the surrounding soft tissues. The right hip is unremarkable.  IMPRESSION: Interval left hip arthroplasty.   Electronically Signed   By: Elige Ko   On: 05/31/2013 21:51   Dg Hip Portable 1 View Left  05/31/2013   CLINICAL DATA:  Postoperative radiograph of the left hip.  EXAM: PORTABLE LEFT HIP - 1 VIEW  COMPARISON:  Left hip radiographs performed 05/30/2013  FINDINGS:  The patient is status post left hip hemiarthroplasty. The prosthesis appears grossly intact, and demonstrates normal positioning, without evidence of loosening or new fracture. The pelvis is not well assessed on this study.  IMPRESSION: Status post left hip hemiarthroplasty; no evidence of loosening or new fracture.   Electronically Signed   By: Roanna Raider M.D.   On: 05/31/2013 21:47    Scheduled Meds: . aspirin EC  325 mg Oral BID  . atenolol  25 mg Oral BID  . benztropine  0.5 mg Oral BID  . Carbidopa  25 mg Oral TID  . carbidopa-levodopa  1 tablet Oral TID  . cholecalciferol  1,000 Units Oral Daily  . docusate sodium  100 mg Oral BID  . donepezil  5 mg Oral QHS  . famotidine  10 mg Oral Daily  . ferrous sulfate  325 mg Oral TID PC  . glycopyrrolate  1 mg Oral BID  . multivitamin with minerals  1 tablet Oral Daily  . mupirocin ointment   Nasal BID  . Rotigotine  8 mg Transdermal Daily  . senna  1 tablet Oral BID  . simvastatin  40 mg Oral Daily   Continuous Infusions: . sodium chloride 100 mL/hr at 06/01/13 1030    Principal Problem:   Femoral neck fracture Active Problems:   HYPERTENSION   GERD   BENIGN PROSTATIC HYPERTROPHY, HX OF   Hyperlipidemia   Hip fracture   Parkinson disease   Closed hip fracture    Time spent: 30 minutes    Rodolph Bonganiel V Thompson M.D. [facility] Pager [phone]. If 7PM-7AM, please contact night-coverage at [email or web address], password [email or web address] 06/01/2013, 12:04 PM  LOS: 2 days

## 2013-06-01 NOTE — Evaluation (Signed)
Physical Therapy Evaluation Patient Details Name: Hunter Willis MRN: [id number] DOB: 08/04/1940 Today's Date: 06/01/2013   History of Present Illness  73 yo male s/p L hip hemiarthroplasty 4/27. hx of Parkinson's, HTN  Clinical Impression  On eval, pt required Mod assist +2 for mobility-able to ambulate ~14 feet with walker. Recommend SNF for continued rehab.     Follow Up Recommendations SNF    Equipment Recommendations  Rolling walker with 5" wheels    Recommendations for Other Services       Precautions / Restrictions Precautions Precautions: Fall;Posterior Hip Precaution Booklet Issued: Yes (comment) Precaution Comments: Reviewed hip precautions and WB status Required Braces or Orthoses: Knee Immobilizer - Left Restrictions Weight Bearing Restrictions: No LLE Weight Bearing: Weight bearing as tolerated      Mobility  Bed Mobility Overal bed mobility: Needs Assistance Bed Mobility: Supine to Sit     Supine to sit: Mod assist;+2 for physical assistance;+2 for safety/equipment     General bed mobility comments: assist for trunk and bil LEs. Increased time. VCs safety, technique, hand placement  Transfers Overall transfer level: Needs assistance Equipment used: Rolling walker (2 wheeled) Transfers: Sit to/from Stand Sit to Stand: From elevated surface;Mod assist;+2 physical assistance;+2 safety/equipment         General transfer comment: assist to rise, stabilize, control descent. VCs safety, technique, hand placement  Ambulation/Gait Ambulation/Gait assistance: Mod assist;+2 physical assistance;+2 safety/equipment Ambulation Distance (Feet): 14 Feet Assistive device: Rolling walker (2 wheeled) Gait Pattern/deviations: Step-to pattern;Trunk flexed;Decreased step length - left;Decreased stride length     General Gait Details: VCs safety, technique, sequence, step length, posture. Slow gait speed. Followed with recliner. Pt fatigues easily.   Stairs            Wheelchair Mobility    Modified Rankin (Stroke Patients Only)       Balance Overall balance assessment: History of Falls;Needs assistance Sitting-balance support: Bilateral upper extremity supported;Feet supported Sitting balance-Leahy Scale: Fair     Standing balance support: Bilateral upper extremity supported Standing balance-Leahy Scale: Poor                               Pertinent Vitals/Pain L hip no pain at rest; 5/10 with activity    Home Living Family/patient expects to be discharged to:: Skilled nursing facility Living Arrangements: Spouse/significant other Available Help at Discharge: Family Type of Home: House Home Access: Ramped entrance     Home Layout: One level        Prior Function Level of Independence: Independent               Hand Dominance        Extremity/Trunk Assessment   Upper Extremity Assessment: Overall WFL for tasks assessed           Lower Extremity Assessment: LLE deficits/detail   LLE Deficits / Details: hip flex 2/5, hip abd/add 2/5, moves ankle well  Cervical / Trunk Assessment: Kyphotic  Communication   Communication: No difficulties  Cognition Arousal/Alertness: Awake/alert Behavior During Therapy: WFL for tasks assessed/performed Overall Cognitive Status: Within Functional Limits for tasks assessed                      General Comments      Exercises        Assessment/Plan    PT Assessment Patient needs continued PT services  PT Diagnosis Difficulty walking;Abnormality of gait;Generalized weakness;Acute pain  PT Problem List Decreased strength;Decreased range of motion;Decreased activity tolerance;Decreased balance;Decreased mobility;Pain;Decreased knowledge of precautions;Decreased knowledge of use of DME;Decreased safety awareness  PT Treatment Interventions DME instruction;Gait training;Functional mobility training;Therapeutic activities;Therapeutic  exercise;Patient/family education;Balance training   PT Goals (Current goals can be found in the Care Plan section) Acute Rehab PT Goals Patient Stated Goal: less pain. rehab PT Goal Formulation: With patient Time For Goal Achievement: 06/15/13 Potential to Achieve Goals: Good    Frequency Min 3X/week   Barriers to discharge        Co-evaluation               End of Session Equipment Utilized During Treatment: Gait belt Activity Tolerance: Patient limited by fatigue;Patient limited by pain Patient left: in chair;with call bell/phone within reach           Time: 1100-1124 PT Time Calculation (min): 24 min   Charges:   PT Evaluation $Initial PT Evaluation Tier I: 1 Procedure PT Treatments $Gait Training: 8-22 mins $Therapeutic Activity: 8-22 mins   PT G Codes:          Rebeca AlertJannie Jye Fariss, MPT Pager: [phone]

## 2013-06-01 NOTE — Progress Notes (Signed)
CSW assisting with d/c planning. CSW has encouraged family to tour facilities to help with their SNF choice. Pt / spouse have chosen [facility]. [facility] declined to make a bed offer. CSW will continue to follow to assist with d/c planning needs.  Cori RazorJamie Davyn Morandi LCSW [phone]

## 2013-06-01 NOTE — Op Note (Signed)
NAME:  Hunter Willis, Hunter Willis                ACCOUNT NO.:  [id number]   MEDICAL RECORD NO.: [id number]   LOCATION:  1435                         [facility]   DATE OF BIRTH:  12/20/1940  PHYSICIAN:  Madlyn FrankelMatthew D. Charlann Boxerlin, M.D.     DATE OF PROCEDURE:  05/31/13                               OPERATIVE REPORT     PREOPERATIVE DIAGNOSIS:  Left displaced femoral neck fracture.   POSTOPERATIVE DIAGNOSIS:  Left displaced femoral neck fracture.   PROCEDURE:  Left hip hemiarthroplasty utilizing DePuy component, size 8 Hi Tri-Lock stem with a 54mm unipolar ball with a +0 adapter.   SURGEON:  Madlyn FrankelMatthew D. Charlann Boxerlin, MD   ASSISTANT:  Leilani AbleSteve Chabon, PA-C   ANESTHESIA:  General.   SPECIMENS:  None.   DRAINS:  None.   BLOOD LOSS:  About 150cc   COMPLICATIONS:  None.   INDICATION OF PROCEDURE:  Mr Gerome ApleyHartley is a pleasant 73 year old male who lives with his wife.  He has Parkinson's disease.  He unfortunately had a fall at their house yesterday.  He was admitted to the hospital after radiographs revealed a left femoral neck fracture.  He was seen and evaluated and was scheduled for surgery for fixation.  The necessity of surgical repair was discussed with he and his family.  Consent was obtained after reviewing risks of infection, DVT, component failure, and need for revision surgery.   PROCEDURE IN DETAIL:  The patient was brought to the operative theater. Once adequate anesthesia, preoperative antibiotics, 2 g of Ancef administered, the patient was positioned into the right lateral decubitus position with the left side up.  The left lower extremity was then prepped and draped in sterile fashion.  A time-out was performed identifying the patient, planned procedure, and extremity.   A lateral incision was made off the proximal trochanter. Sharp dissection was carried down to the iliotibial band and gluteal fascia. The gluteal fascia was then incised for posterior approach.  The short external rotators were  taken down separate from the posterior capsule. An L capsulotomy was made preserving the posterior leaflet for later anatomic repair. Fracture site was identified and after removing comminuted segments of the posterior femoral neck, the femoral head was removed without difficulty and measured on the back table  using the sizing rings and determined to be 54 mm in diameter.   The proximal femur was then exposed.  Retractors placed.  I then drilled, opened the proximal femur.  Then I hand reamed once and  Irrigated the canal to try to prevent fat emboli.  I began broaching the femur with a starter broach up to a size 8 broach with good medial and lateral metaphyseal fit without evidence of any torsion or movement.  A trial reduction was carried out with a high offset neck and a +0 adapter with a 54mm unipolar ball.  The hip reduced nicely.  The leg lengths appeared to be equal compared to the down leg.   The hip went through a range of motion without evidence of any subluxation or impingement.   Given these findings, the trial components removed.  The final 8 Hi  Tri-Lock stem was opened.  After irrigating the canal, the final stem was impacted and sat at the level where the broach was. Based on this and the trial reduction, a +0 adapter was opened and impacted in the 54mm unipolar ball onto a clean and dry trunnion.  The hip had been irrigated throughout the case and again at this point.  I re- Approximated the posterior capsule to the superior leaflet using a  #1 Vicryl.  The remainder of the wound was closed with #1 Vicryl and #0 V-lock sutures in the iliotibial band and gluteal fascia, a  2-0 Vicryl in the sub-Q tissue and a running 4-0 Monocryl in the skin.  The hip was cleaned, dried, and dressed sterilely using Dermabond and Aquacel dressing.  She was then brought to recovery room, extubated in stable condition, tolerating the procedure well.  Leilani AbleSteve Chabon, PA-C was present and  utilized as Geophysicist/field seismologistassistant for the entire case from  Preoperative positioning to management of the contralateral extremity and retractors to  General facilitation of the procedure.  He was also involved with primary wound closure.         Madlyn FrankelMatthew D. Charlann Boxerlin, M.D.

## 2013-06-01 NOTE — Discharge Instructions (Addendum)
Weightbearing as tolerated. You may shower but the bandage should remain on.  Take aspirin 325mg  twice daily for prevention of blood clots.Hip Fracture (Upper Femoral Fracture) You have a hip fracture (break in bone). This is a fracture of the upper part of the big bone (femur, thigh bone) between your hip and knee. If your caregiver feels it is a stable fracture, occasionally it can be treated without surgery. Usually these fractures are unstable. This means that the bones will not heal properly without surgery. Surgery is necessary to hold the bones together in a good position where they will heal well. DIAGNOSIS A physical exam can determine if a fracture has occurred. X-ray studies are needed to see what type of fracture is present and to look for other injuries. These studies will help your caregiver determine what the best treatment is for you. If there is more than one option, your caregiver can give you the information needed to help you decide on the treatment. TREATMENT  The treatment for an unstable fracture is usually surgery. This means using a screw, nail, or rod to hold the bones in place.  RISKS AND COMPLICATIONS All surgery is associated with risks. Sometimes the implant may fail. Other complications of surgery include infection or the bones not healing properly. Sometimes the fracture may damage the blood supply to the head of the femur. That portion of bone may die (osteonecrosis or avascular necrosis). Sometimes to avoid this complication, an implant is used which just replaces the ball of the femur (hemi-arthroplasty or prosthetic replacement). Some of the other risks are:  Excessive bleeding.  Infection.  Dislocation if a hemi-arthroplasty or a total hip was inserted.  Failure to heal properly resulting in an unstable hip.  Stiffness of hip following repair.  On occasion, blood may have to be replaced before or during the procedure LET YOUR CAREGIVERS KNOW  ABOUT:  Allergies.  Medications taken including herbs, eye drops, over the counter medications, and creams.  Use of steroids (by mouth or creams).  History of bleeding or blood problems.  History of serious infection.  Previous problems with anesthetics or novocaine.  Possibility of pregnancy, if this applies.  History of blood clots (thrombophlebitis).  Previous surgery.  Other health problems. BEFORE THE PROCEDURE Before surgery, an IV (intravenous line connected to your vein) may be started. You will be given an anesthetic (medications and gas to make you sleep) or given medications in your back to make you numb from the waist down (spinal anesthetic). AFTER THE PROCEDURE After surgery, you will be taken to the recovery area where a nurse will watch your progress. You may have a catheter (a long, narrow, hollow tube) in your bladder that helps you pass your water. Once you're awake, stable, and taking fluids well, you will be returned to your room. You will receive physical therapy and other care until you are doing well and your caregiver feels it is safe for you to be transferred either to home or to an extended care facility. Your activity level will change as your caregiver determines what is best for you.  You may resume normal diet and activities as directed or allowed.  Change dressings if necessary or as directed.  Only take over-the-counter or prescription medicines for pain, discomfort, or fever as directed by your caregiver.  You may be placed on blood thinners for 4-6 weeks to prevent blood clots. SEEK IMMEDIATE MEDICAL CARE IF:  There is swelling of your calf or leg.  You have shortness of breath or chest pain.  There is redness, swelling, or increasing pain in the wound.  There is pus coming from wound.  You have an unexplained oral temperature above 102 F (38.9 C).  There is a foul (bad) smell coming from the wound or dressing.  There is a breaking  open of the wound (edges not staying together) after sutures or staples have been removed.  There is a marked increase in pain or shortening of the leg.  You have severe pain anywhere in the leg.  There is any change in color or temperature of your leg below the injury. MAKE SURE YOU:   Understand these instructions.  Will watch your condition.  Will get help right away if you are not doing well or get worse. Document Released: 01/21/2005 Document Revised: 04/15/2011 Document Reviewed: 09/02/2012 [facility] Patient Information 2014 [place].

## 2013-06-02 DIAGNOSIS — M549 Dorsalgia, unspecified: Secondary | ICD-10-CM

## 2013-06-02 DIAGNOSIS — J309 Allergic rhinitis, unspecified: Secondary | ICD-10-CM

## 2013-06-02 DIAGNOSIS — Z87898 Personal history of other specified conditions: Secondary | ICD-10-CM

## 2013-06-02 LAB — BASIC METABOLIC PANEL
BUN: 22 mg/dL (ref 6–23)
CO2: 26 mEq/L (ref 19–32)
Calcium: 8.9 mg/dL (ref 8.4–10.5)
Chloride: 103 mEq/L (ref 96–112)
Creatinine, Ser: 0.89 mg/dL (ref 0.50–1.35)
GFR, EST NON AFRICAN AMERICAN: 82 mL/min — AB (ref >90)
Glucose, Bld: 134 mg/dL — ABNORMAL HIGH (ref 70–99)
POTASSIUM: 4.1 meq/L (ref 3.7–5.3)
Sodium: 140 mEq/L (ref 137–147)

## 2013-06-02 LAB — CBC
HEMATOCRIT: 33.6 % — AB (ref 39.0–52.0)
Hemoglobin: 11.1 g/dL — ABNORMAL LOW (ref 13.0–17.0)
MCH: 31.4 pg (ref 26.0–34.0)
MCHC: 33 g/dL (ref 30.0–36.0)
MCV: 95.2 fL (ref 78.0–100.0)
Platelets: 171 10*3/uL (ref 150–400)
RBC: 3.53 MIL/uL — ABNORMAL LOW (ref 4.22–5.81)
RDW: 13.3 % (ref 11.5–15.5)
WBC: 8.9 10*3/uL (ref 4.0–10.5)

## 2013-06-02 NOTE — Progress Notes (Signed)
Clinical Social Work Department CLINICAL SOCIAL WORK PLACEMENT NOTE 06/02/2013  Patient:  Lyn RecordsHARTLEY,Mohammed D  Account Number:  [id number] Admit date:  05/30/2013  Clinical Social Worker:  Cori RazorJAMIE Laxmi Choung, LCSW  Date/time:  05/31/2013 03:25 PM  Clinical Social Work is seeking post-discharge placement for this patient at the following level of care:   SKILLED NURSING   (*CSW will update this form in Epic as items are completed)   05/31/2013  Patient/family provided with [facility] Department of Clinical Social Work's list of facilities offering this level of care within the geographic area requested by the patient (or if unable, by the patient's family).    Patient/family informed of their freedom to choose among providers that offer the needed level of care, that participate in Medicare, Medicaid or managed care program needed by the patient, have an available bed and are willing to accept the patient.  05/31/2013  Patient/family informed of [facility]' ownership interest in [facility], as well as of the fact that they are under no obligation to receive care at this facility.  PASARR submitted to EDS on 05/31/2013 PASARR number received from EDS on 05/31/2013  FL2 transmitted to all facilities in geographic area requested by pt/family on  05/31/2013 FL2 transmitted to all facilities within larger geographic area on   Patient informed that his/her managed care company has contracts with or will negotiate with  certain facilities, including the following:     Patient/family informed of bed offers received:  05/31/2013 [facility] Physician recommends and patient chooses bed at    [facility] on  06/02/2013 Patient to be transferred to facility by [facility]  The following physician request were entered in Epic:   Additional Comments:  Cori RazorJamie Ladora Osterberg LCSW [phone]

## 2013-06-02 NOTE — Discharge Summary (Signed)
Physician Discharge Summary  Hunter Willis AOZ:[id number] DOB: 06/23/1940 DOA: 05/30/2013  PCP: Crawford GivensGraham Duncan, MD  Admit date: 05/30/2013 Discharge date: 06/02/2013  RecommLyn Recordsendations for Outpatient Follow-up:  1. Pt will need to follow up with PCP in 2-3 weeks post discharge 2. Please obtain BMP to evaluate electrolytes and kidney function 3. Please also check CBC to evaluate Hg and Hct levels  Discharge Diagnoses: Hip fracture Principal Problem:   Femoral neck fracture Active Problems:   HYPERTENSION   GERD   BENIGN PROSTATIC HYPERTROPHY, HX OF   Hyperlipidemia   Hip fracture   Parkinson disease   Closed hip fracture  Discharge Condition: Stable  Diet recommendation: Heart healthy diet discussed in details   History of present illness:  73 y.o. male with PMh of Parkinson's disease with severe tremors, HTN, presented to ED after an episode of fall and has sustained hip fracture.  In ER, noted to have a L femur neck fracture and Dr.Brooks was consulted as well as [facility] for admission.   Hospital Course:  #1 femoral neck fracture  Patient states pain is controlled. Patient status post left hip hemiarthroplasty 05/31/2013 per Dr. Charlann Boxerlin. Saline lock IV fluids. Continue pain management per orthopedics. #2 Parkinson's disease  Stable. Continue Sinemet, Cogentin, Aricept.  #3 hypertension  Stable. Continue atenolol.  #4 gastroesophageal reflux disease  Pepcid.  #5 prophylaxis  Pepcid for GI prophylaxis  Code Status: Full  Family Communication: Updated patient no family at bedside.  Disposition Plan: skilled nursing facility   Consultants:  Orthopedics: Dr. Shon BatonBrooks 05/31/2013 Procedures:  X-ray of the wrist 05/30/2013  Chest x-ray 05/30/2013  X-ray of the left hip 05/30/2013  Left hip hemiarthroplasty per Dr. Charlann Boxerlin 06/01/2013 Antibiotics:  None  Discharge Exam: Filed Vitals:   06/02/13 0648  BP: 158/62  Pulse: 76  Temp: 97.6 F (36.4 C)  Resp: 16   Filed Vitals:   06/01/13 1346 06/01/13 1516 06/01/13 2328 06/02/13 0648  BP: 159/77  161/67 158/62  Pulse: 72  80 76  Temp: 97.7 F (36.5 C)  97.8 F (36.6 C) 97.6 F (36.4 C)  TempSrc: Oral  Axillary Axillary  Resp: 18 16 16 16   Height:      Weight:      SpO2: 94%  96% 97%    General: Pt is alert, follows commands appropriately, not in acute distress Cardiovascular: Regular rate and rhythm, S1/S2 +, no murmurs, no rubs, no gallops Respiratory: Clear to auscultation bilaterally, no wheezing, no crackles, no rhonchi Abdominal: Soft, non tender, non distended, bowel sounds +, no guarding Extremities: no edema, no cyanosis, pulses palpable bilaterally DP and PT  Discharge Instructions  Discharge Orders   Future Appointments Provider Department Dept Phone   08/09/2013 11:00 AM [facility] [phone]   08/13/2013 11:30 AM Joaquim NamGraham S Duncan, MD [facility] [phone]   Future Orders Complete By Expires   Diet - low sodium heart healthy  As directed    Increase activity slowly  As directed    Weight bearing as tolerated  As directed    Questions:     Laterality:  left   Extremity:  Lower       Medication List         aspirin EC 325 MG tablet  Take 1 tablet (325 mg total) by mouth 2 (two) times daily.     atenolol 25 MG tablet  Commonly known as:  TENORMIN  Take 25 mg by mouth 2 (  two) times daily.     benztropine 0.5 MG tablet  Commonly known as:  COGENTIN  Take 0.5 mg by mouth 2 (two) times daily.     Carbidopa 25 MG tablet  Take 25 mg by mouth 3 (three) times daily.     carbidopa-levodopa 25-100 MG per tablet  Commonly known as:  SINEMET IR  Take 1 tablet by mouth 3 (three) times daily.     cholecalciferol 1000 UNITS tablet  Commonly known as:  VITAMIN D  Take 1,000 Units by mouth daily.     donepezil 5 MG tablet  Commonly known as:  ARICEPT  Take 5 mg by mouth at bedtime.     DSS 100 MG Caps  Take 100 mg by mouth 2  (two) times daily.     glycopyrrolate 1 MG tablet  Commonly known as:  ROBINUL  Take 1 mg by mouth 2 (two) times daily.     HYDROcodone-acetaminophen 7.5-325 MG per tablet  Commonly known as:  NORCO  Take 1-2 tablets by mouth every 4 (four) hours as needed for moderate pain or severe pain (breakthrough pain).     methocarbamol 500 MG tablet  Commonly known as:  ROBAXIN  Take 1 tablet (500 mg total) by mouth every 6 (six) hours as needed for muscle spasms.     multivitamin tablet  Take 1 tablet by mouth 2 (two) times daily.     NEUPRO 8 MG/24HR Pt24  Generic drug:  Rotigotine  Place 8 mg onto the skin daily.     OVER THE COUNTER MEDICATION  Take 1,500 mg by mouth daily. "Muscle enhancer."     polyethylene glycol packet  Commonly known as:  MIRALAX / GLYCOLAX  Take 17 g by mouth daily as needed for mild constipation.     simvastatin 40 MG tablet  Commonly known as:  ZOCOR  Take 40 mg by mouth daily.     ZANTAC PO  Take 1 tablet by mouth every morning.           Follow-up Information   Follow up with Shelda Pal, MD. Schedule an appointment as soon as possible for a visit in 2 weeks.   Specialty:  Orthopedic Surgery   Contact information:   [address] [place] [address] [phone]       Schedule an appointment as soon as possible for a visit with [name], MD.   Specialty:  [facility]   Contact information:   [address] [place] [address] [phone]        The results of significant diagnostics from this hospitalization (including imaging, microbiology, ancillary and laboratory) are listed below for reference.     Microbiology: Recent Results (from the past 240 hour(s))  SURGICAL PCR SCREEN     Status: Abnormal   Collection Time    05/30/13  8:23 PM      Result Value Ref Range Status   MRSA, PCR NEGATIVE  NEGATIVE Final   Staphylococcus aureus POSITIVE (*) NEGATIVE Final   Comment:            The  Xpert SA Assay (FDA     approved for NASAL specimens     in patients over 58 years of age),     is one component of     a comprehensive surveillance     program.  Test performance has     been validated by [employer] for patients greater  than or equal to 75 year old.     It is not intended     to diagnose infection nor to     guide or monitor treatment.     Labs: Basic Metabolic Panel:  Recent Labs Lab 05/30/13 1600 05/31/13 0422 06/02/13 0423  NA 139 138 140  K 4.4 4.0 4.1  CL 99 99 103  CO2 29 27 26   GLUCOSE 99 122* 134*  BUN 29* 24* 22  CREATININE 0.93 1.02 0.89  CALCIUM 10.1 9.1 8.9   CBC:  Recent Labs Lab 05/30/13 1600 05/31/13 0422 06/02/13 0423  WBC 9.5 8.8 8.9  NEUTROABS 7.7  --   --   HGB 14.0 13.3 11.1*  HCT 41.9 41.2 33.6*  MCV 95.9 96.0 95.2  PLT 196 199 171   SIGNED: Time coordinating discharge: Over 30 minutes  Dorothea OgleIskra M Khandi Kernes, MD  [facility] 06/02/2013, 10:59 AM Pager [phone]  If 7PM-7AM, please contact night-coverage [email or web address] Password [email or web address]

## 2013-06-02 NOTE — Progress Notes (Signed)
Physical Therapy Treatment Patient Details Name: Hunter Willis MRN: [id number] DOB: 07/03/1940 Today's Date: 06/02/2013    History of Present Illness 73 yo male s/p L hip hemiarthroplasty 4/27. hx of Parkinson's, HTN    PT Comments    Progressing with mobility. Plan is for SNF later today.   Follow Up Recommendations  SNF     Equipment Recommendations  Rolling walker with 5" wheels    Recommendations for Other Services OT consult     Precautions / Restrictions Precautions Precautions: Posterior Hip;Fall Precaution Booklet Issued: Yes (comment) Precaution Comments: Reviewed hip precautions and WB status. Pt unable to recall precautions Required Braces or Orthoses: Knee Immobilizer - Left Restrictions Weight Bearing Restrictions: No LLE Weight Bearing: Weight bearing as tolerated    Mobility  Bed Mobility Overal bed mobility: Needs Assistance Bed Mobility: Supine to Sit     Supine to sit: Mod assist;+2 for physical assistance;+2 for safety/equipment     General bed mobility comments: assist for trunk and bil LEs. Increased time. VCs safety, technique, hand placement  Transfers Overall transfer level: Needs assistance Equipment used: Rolling walker (2 wheeled) Transfers: Sit to/from Stand Sit to Stand: Mod assist;+2 physical assistance;+2 safety/equipment;From elevated surface         General transfer comment: assist to rise, stabilize, control descent. VCs safety, technique, hand placement  Ambulation/Gait Ambulation/Gait assistance: Min assist Ambulation Distance (Feet): 50 Feet Assistive device: Rolling walker (2 wheeled) Gait Pattern/deviations: Step-to pattern;Trunk flexed;Decreased stride length;Decreased step length - right;Decreased step length - left;Step-through pattern     General Gait Details: VCs safety, technique, sequence, step length, posture. Slow gait speed.    Stairs            Wheelchair Mobility    Modified Rankin (Stroke  Patients Only)       Balance                                    Cognition Arousal/Alertness: Awake/Willis Behavior During Therapy: WFL for tasks assessed/performed Overall Cognitive Status: Within Functional Limits for tasks assessed                      Exercises      General Comments        Pertinent Vitals/Pain L hip-unrated    Home Living                      Prior Function            PT Goals (current goals can now be found in the care plan section) Progress towards PT goals: Progressing toward goals    Frequency  Min 3X/week    PT Plan Current plan remains appropriate    Co-evaluation             End of Session Equipment Utilized During Treatment: Gait belt Activity Tolerance: Patient tolerated treatment well Patient left: in chair;with call bell/phone within reach     Time: 1015-1045 PT Time Calculation (min): 30 min  Charges:  $Gait Training: 23-37 mins                    G Codes:      Hunter AlertJannie Martavious Willis, MPT Pager: [phone]

## 2013-06-02 NOTE — Plan of Care (Signed)
Problem: Discharge Progression Outcomes Goal: Ambulates safely using assistive device Outcome: Completed/Met Date Met:  06/02/13 Adequate for SNF/Rehab Goal: ADLs with assistance Outcome: Completed/Met Date Met:  06/02/13 Adequate for SNF

## 2013-06-02 NOTE — Evaluation (Signed)
Occupational Therapy Evaluation Patient Details Name: Hunter Willis MRN: [id number] DOB: 01/28/1941 Today's Date: 06/02/2013    History of Present Illness 73 yo male s/p L hip hemiarthroplasty 4/27. hx of Parkinson's, HTN   Clinical Impression   Pt requiring min assist for OOB activity with second person for safety.  Balance in standing is poor so pt unable to perform grooming at sink or complete LB ADL  Plan is for pt to go to SNF for ST rehab.  Will defer further OT to SNF.    Follow Up Recommendations  SNF;Supervision/Assistance - 24 hour    Equipment Recommendations       Recommendations for Other Services       Precautions / Restrictions Precautions Precautions: Posterior Hip;Fall Precaution Booklet Issued: Yes (comment) Precaution Comments: Reviewed hip precautions and WB status. Pt unable to recall precautions Required Braces or Orthoses: Knee Immobilizer - Left Restrictions Weight Bearing Restrictions: No LLE Weight Bearing: Weight bearing as tolerated      Mobility Bed Mobility Overal bed mobility: Needs Assistance Bed Mobility: Supine to Sit     Supine to sit: Mod assist;+2 for physical assistance;+2 for safety/equipment     General bed mobility comments: assist for trunk and bil LEs. Increased time. VCs safety, technique, hand placement  Transfers Overall transfer level: Needs assistance Equipment used: Rolling walker (2 wheeled) Transfers: Sit to/from Stand Sit to Stand: Mod assist;+2 physical assistance;+2 safety/equipment;From elevated surface         General transfer comment: assist to rise, stabilize, control descent. VCs safety, technique, hand placement    Balance                                            ADL Overall ADL's : Needs assistance/impaired Eating/Feeding: Independent;Sitting   Grooming: Wash/dry hands;Wash/dry face;Oral care;Brushing hair;Set up;Sitting   Upper Body Bathing: Set up;Sitting   Lower Body  Bathing: Sit to/from stand;Total assistance   Upper Body Dressing : Set up;Sitting   Lower Body Dressing: Sit to/from stand;Total assistance   Toilet Transfer: Minimal assistance;Ambulation           Functional mobility during ADLs: Minimal assistance;Rolling walker (second person for safety) General ADL Comments: Instructed in hip precautions related to ADL.     Vision                     Perception     Praxis      Pertinent Vitals/Pain Reports L hip pain as mild soreness, VSS     Hand Dominance Left   Extremity/Trunk Assessment Upper Extremity Assessment Upper Extremity Assessment:  (B moderate tremor)   Lower Extremity Assessment Lower Extremity Assessment: Defer to PT evaluation   Cervical / Trunk Assessment Cervical / Trunk Assessment: Kyphotic   Communication Communication Communication: No difficulties   Cognition Arousal/Alertness: Awake/alert Behavior During Therapy: WFL for tasks assessed/performed Overall Cognitive Status: Within Functional Limits for tasks assessed                     General Comments       Exercises       Shoulder Instructions      Home Living Family/patient expects to be discharged to:: Skilled nursing facility Living Arrangements: Spouse/significant other Available Help at Discharge: Family  Prior Functioning/Environment Level of Independence: Independent             OT Diagnosis: Generalized weakness;Acute pain   OT Problem List: Decreased strength;Decreased activity tolerance;Impaired balance (sitting and/or standing);Decreased knowledge of use of DME or AE;Decreased knowledge of precautions;Impaired tone;Pain   OT Treatment/Interventions:      OT Goals(Current goals can be found in the care plan section) Acute Rehab OT Goals Patient Stated Goal: less pain. rehab  OT Frequency:     Barriers to D/C:            Co-evaluation PT/OT/SLP  Co-Evaluation/Treatment: Yes Reason for Co-Treatment: Complexity of the patient's impairments (multi-system involvement);For patient/therapist safety   OT goals addressed during session: ADL's and self-care      End of Session Equipment Utilized During Treatment: Gait belt  Activity Tolerance: Patient tolerated treatment well Patient left: in chair;with call bell/phone within reach   Time: 1014-1055 OT Time Calculation (min): 41 min Charges:  OT General Charges $OT Visit: 1 Procedure OT Evaluation $Initial OT Evaluation Tier I: 1 Procedure OT Treatments $Self Care/Home Management : 8-22 mins G-Codes:    Hunter Willis 06/02/2013, 11:09 AM [phone]

## 2013-06-02 NOTE — Progress Notes (Signed)
Discharged from floor via stretcher, EMS with pt. No changes in assessment. Going to [facility]. [employer] Sevannah Madia, RCharity fundraiser

## 2013-06-02 NOTE — Progress Notes (Signed)
Report called to [facility] & given to Azucena FallenXena Graves, RN. [employer] Bree Heinzelman

## 2013-06-02 NOTE — Progress Notes (Signed)
Patient ID: Hunter Willis, male   DOB: 11/15/1940, 73 y.o.   MRN: [id number] Subjective: 2 Days Post-Op Procedure(s) (LRB): LEFT HIP HEMIARTHROPLASTY (Left)    Patient reports pain as mild.  Headache this am otherwise doing well  Objective:   VITALS:   Filed Vitals:   06/02/13 0648  BP: 158/62  Pulse: 76  Temp: 97.6 F (36.4 C)  Resp: 16    Neurovascular stable Dressing c/d/i  LABS  Recent Labs  05/30/13 1600 05/31/13 0422 06/02/13 0423  HGB 14.0 13.3 11.1*  HCT 41.9 41.2 33.6*  WBC 9.5 8.8 8.9  PLT 196 199 171     Recent Labs  05/30/13 1600 05/31/13 0422 06/02/13 0423  NA 139 138 140  K 4.4 4.0 4.1  BUN 29* 24* 22  CREATININE 0.93 1.02 0.89  GLUCOSE 99 122* 134*     Recent Labs  05/30/13 1600 05/31/13 0422  INR 1.13 1.20     Assessment/Plan: 2 Days Post-Op Procedure(s) (LRB): LEFT HIP HEMIARTHROPLASTY (Left)   Up with therapy Discharge to SNF probably today D/c information in chart F/u in 2 weeks in office

## 2013-06-03 NOTE — Progress Notes (Signed)
Utilization review completed.  

## 2013-06-04 DIAGNOSIS — G20 Parkinson's disease: Secondary | ICD-10-CM

## 2013-06-04 DIAGNOSIS — Z96649 Presence of unspecified artificial hip joint: Secondary | ICD-10-CM

## 2013-06-04 DIAGNOSIS — K219 Gastro-esophageal reflux disease without esophagitis: Secondary | ICD-10-CM

## 2013-06-04 DIAGNOSIS — I10 Essential (primary) hypertension: Secondary | ICD-10-CM

## 2013-06-04 DIAGNOSIS — Z471 Aftercare following joint replacement surgery: Secondary | ICD-10-CM

## 2013-06-04 DIAGNOSIS — E785 Hyperlipidemia, unspecified: Secondary | ICD-10-CM

## 2013-06-05 NOTE — ED Provider Notes (Signed)
Medical screening examination/treatment/procedure(s) were conducted as a shared visit with non-physician practitioner(s) and myself.  I personally evaluated the patient during the encounter.   EKG Interpretation   Date/Time:  Sunday May 30 2013 15:34:55 EDT Ventricular Rate:  62 PR Interval:  49 QRS Duration: 88 QT Interval:  431 QTC Calculation: 438 R Axis:   -3 Text Interpretation:  Normal sinus rhythm `no acute st/t changes Missing  lead(s): V6 No previous tracing Confirmed by Denton LankSTEINL  MD, Caryn BeeKEVIN ([id number]) on  05/30/2013 3:52:06 PM      Pt s/p mechanical fall, c/o left hip pain. Denies head injury or loc. Spine nt. Pain left hip, distal pulses palp, motor/sens intact. Xrays. Labs. Admit.   Suzi RootsKevin E Lasean Rahming, MD 06/05/13 [phone]

## 2013-06-08 DIAGNOSIS — S92919B Unspecified fracture of unspecified toe(s), initial encounter for open fracture: Secondary | ICD-10-CM

## 2013-06-08 DIAGNOSIS — R110 Nausea: Secondary | ICD-10-CM

## 2013-06-09 DIAGNOSIS — F329 Major depressive disorder, single episode, unspecified: Secondary | ICD-10-CM

## 2013-06-09 DIAGNOSIS — F3289 Other specified depressive episodes: Secondary | ICD-10-CM

## 2013-06-09 DIAGNOSIS — F411 Generalized anxiety disorder: Secondary | ICD-10-CM

## 2013-06-29 DIAGNOSIS — R0982 Postnasal drip: Secondary | ICD-10-CM

## 2013-06-29 DIAGNOSIS — R059 Cough, unspecified: Secondary | ICD-10-CM

## 2013-06-29 DIAGNOSIS — R05 Cough: Secondary | ICD-10-CM

## 2013-07-07 ENCOUNTER — Telehealth: Payer: Self-pay

## 2013-07-07 NOTE — Telephone Encounter (Signed)
[place] pharmacy said pt is at [facility] for rehab and [employer] rx was faxed to pharmacy but pharmacy needs to know where original rx is for [employer]. Advised to call [facility].

## 2013-08-04 ENCOUNTER — Other Ambulatory Visit: Payer: Self-pay | Admitting: Family Medicine

## 2013-08-04 DIAGNOSIS — I10 Essential (primary) hypertension: Secondary | ICD-10-CM

## 2013-08-04 DIAGNOSIS — E559 Vitamin D deficiency, unspecified: Secondary | ICD-10-CM

## 2013-08-09 ENCOUNTER — Other Ambulatory Visit (INDEPENDENT_AMBULATORY_CARE_PROVIDER_SITE_OTHER): Payer: Medicare Other

## 2013-08-09 DIAGNOSIS — I10 Essential (primary) hypertension: Secondary | ICD-10-CM

## 2013-08-09 DIAGNOSIS — E559 Vitamin D deficiency, unspecified: Secondary | ICD-10-CM

## 2013-08-09 LAB — COMPREHENSIVE METABOLIC PANEL
ALT: 14 U/L (ref 0–53)
AST: 18 U/L (ref 0–37)
Albumin: 4.1 g/dL (ref 3.5–5.2)
Alkaline Phosphatase: 95 U/L (ref 39–117)
BILIRUBIN TOTAL: 0.7 mg/dL (ref 0.2–1.2)
BUN: 19 mg/dL (ref 6–23)
CALCIUM: 9.5 mg/dL (ref 8.4–10.5)
CHLORIDE: 103 meq/L (ref 96–112)
CO2: 23 mEq/L (ref 19–32)
Creatinine, Ser: 0.9 mg/dL (ref 0.4–1.5)
GFR: 86.34 mL/min (ref >60.00)
Glucose, Bld: 107 mg/dL — ABNORMAL HIGH (ref 70–99)
Potassium: 4 mEq/L (ref 3.5–5.1)
Sodium: 137 mEq/L (ref 135–145)
Total Protein: 7.2 g/dL (ref 6.0–8.3)

## 2013-08-09 LAB — LIPID PANEL
Cholesterol: 178 mg/dL (ref 0–200)
HDL: 58.2 mg/dL (ref >39.00)
LDL CALC: 102 mg/dL — AB (ref 0–99)
NONHDL: 119.8
Total CHOL/HDL Ratio: 3
Triglycerides: 90 mg/dL (ref 0.0–149.0)
VLDL: 18 mg/dL (ref 0.0–40.0)

## 2013-08-09 LAB — VITAMIN D 25 HYDROXY (VIT D DEFICIENCY, FRACTURES): VITD: 36.53 ng/mL

## 2013-08-13 ENCOUNTER — Encounter: Payer: 59 | Admitting: Family Medicine

## 2013-09-03 ENCOUNTER — Encounter: Payer: Self-pay | Admitting: Family Medicine

## 2013-09-03 ENCOUNTER — Ambulatory Visit (INDEPENDENT_AMBULATORY_CARE_PROVIDER_SITE_OTHER): Payer: Medicare Other | Admitting: Family Medicine

## 2013-09-03 VITALS — BP 116/58 | HR 69 | Temp 97.5°F | Ht 77.0 in | Wt 182.0 lb

## 2013-09-03 DIAGNOSIS — Z0000 Encounter for general adult medical examination without abnormal findings: Secondary | ICD-10-CM

## 2013-09-03 DIAGNOSIS — K208 Other esophagitis without bleeding: Secondary | ICD-10-CM

## 2013-09-03 DIAGNOSIS — E785 Hyperlipidemia, unspecified: Secondary | ICD-10-CM

## 2013-09-03 DIAGNOSIS — R251 Tremor, unspecified: Secondary | ICD-10-CM

## 2013-09-03 DIAGNOSIS — Z7189 Other specified counseling: Secondary | ICD-10-CM

## 2013-09-03 MED ORDER — ATENOLOL 25 MG PO TABS: 25.0000 mg | ORAL_TABLET | Freq: Two times a day (BID) | ORAL | Status: DC

## 2013-09-03 MED ORDER — PANTOPRAZOLE SODIUM 40 MG PO TBEC: 40.0000 mg | DELAYED_RELEASE_TABLET | Freq: Every day | ORAL | Status: DC

## 2013-09-03 MED ORDER — SIMVASTATIN 40 MG PO TABS: 40.0000 mg | ORAL_TABLET | Freq: Every day | ORAL | Status: DC

## 2013-09-03 MED ORDER — CHOLECALCIFEROL 25 MCG (1000 UT) PO CAPS: 1000.0000 [IU] | ORAL_CAPSULE | Freq: Every day | ORAL | Status: DC

## 2013-09-03 NOTE — Patient Instructions (Addendum)
Try to get an appointment the eye clinic at some point.  Check with your insurance to see if they will cover the shingles shot. Take care. Glad to see you.  I'll await your neurology and urology notes.

## 2013-09-03 NOTE — Progress Notes (Signed)
Pre visit review using our clinic review tool, if applicable. No additional management support is needed unless otherwise documented below in the visit note.  I have personally reviewed the Medicare Annual Wellness questionnaire and have noted 1. The patient's medical and social history 2. Their use of alcohol, tobacco or illicit drugs 3. Their current medications and supplements 4. The patient's functional ability including ADL's, fall risks, home safety risks and hearing or visual             impairment. 5. Diet and physical activities 6. Evidence for depression or mood disorders  The patients weight, height, BMI have been recorded in the chart and visual acuity is per eye clinic.  I have made referrals, counseling and provided education to the patient based review of the above and I have provided the pt with a written personalized care plan for preventive services.  Provider list updated- see scanned forms.  Routine anticipatory guidance given to patient.  See health maintenance.  Flu yearly Shingles dw pt.   PNA 2011 Tetanus 200 Colonoscopy deferred Prostate cancer screening per urology Advance directive- wife designated if patient were incapacitated.  Cognitive function addressed- see scanned forms- and if abnormal then additional documentation follows.   S/p hip fx, released by ortho.   Elevated Cholesterol: Using medications without problems:prev tolerated, but off med now.  Labs d/w pt.  Muscle aches: no Diet compliance:yes Exercise:yes  Tremor per neuro.    LPR sx from GERD controlled with no ADE on PPI.  Doing well.  Needs refill.  Sent today.   PMH and SH reviewed  Meds, vitals, and allergies reviewed.   ROS: See HPI.  Otherwise negative.    GEN: nad, alert and oriented HEENT: mucous membranes moist NECK: supple w/o LA CV: rrr. PULM: ctab, no inc wob ABD: soft, +bs EXT: trace edema SKIN: no acute rash Tremor in hands noted B.

## 2013-09-05 DIAGNOSIS — Z7189 Other specified counseling: Secondary | ICD-10-CM | POA: Insufficient documentation

## 2013-09-05 NOTE — Assessment & Plan Note (Signed)
With h/o LPR sx, voice changes prev.  Continue PPI.

## 2013-09-05 NOTE — Assessment & Plan Note (Signed)
Flu yearly Shingles dw pt.   PNA 2011 Tetanus 200 Colonoscopy deferred Prostate cancer screening per urology Advance directive- wife designated if patient were incapacitated.  Cognitive function addressed- see scanned forms- and if abnormal then additional documentation follows.

## 2013-09-05 NOTE — Assessment & Plan Note (Signed)
Restart statin, labs d/w pt.

## 2013-09-05 NOTE — Assessment & Plan Note (Signed)
Per neuro 

## 2013-09-06 ENCOUNTER — Other Ambulatory Visit: Payer: Self-pay

## 2013-09-06 MED ORDER — PANTOPRAZOLE SODIUM 40 MG PO TBEC: 40.0000 mg | DELAYED_RELEASE_TABLET | Freq: Every day | ORAL | Status: DC

## 2013-09-06 NOTE — Telephone Encounter (Signed)
Pt request ref[place] pantoprazole # [phone] to [place] [facility] until receive mail order med. Advised pt done.

## 2013-10-21 DIAGNOSIS — K117 Disturbances of salivary secretion: Secondary | ICD-10-CM | POA: Insufficient documentation

## 2013-11-09 ENCOUNTER — Telehealth: Payer: Self-pay | Admitting: Family Medicine

## 2013-11-09 NOTE — Telephone Encounter (Signed)
Patient notified as instructed by telephone. Patient requested that his appointment for the flu shot be cancelled because something has come up.

## 2013-11-09 NOTE — Telephone Encounter (Signed)
This needs to come through neuro, who has seen him re: tremor.  I'll defer to neuro.

## 2013-11-09 NOTE — Telephone Encounter (Signed)
Caller: Hunter Willis/Patient; Phone: [phone]; Reason for Call: Patient's last office visit was in July 2015.  Reports he mentioned these symptoms with the doctor and that time, but provider did not give any instructions.  Reports that legs have involuntary movements especially at night time.  He is requesting medication for this instead of an office visit.  Please contact the patient to give further instructions.

## 2013-11-12 ENCOUNTER — Ambulatory Visit: Payer: Medicare Other

## 2013-12-10 ENCOUNTER — Observation Stay: Payer: Self-pay | Admitting: Internal Medicine

## 2013-12-10 LAB — URINALYSIS, COMPLETE
BACTERIA: NONE SEEN
BILIRUBIN, UR: NEGATIVE
Glucose,UR: NEGATIVE mg/dL (ref 0–75)
LEUKOCYTE ESTERASE: NEGATIVE
NITRITE: NEGATIVE
Ph: 6 (ref 4.5–8.0)
Protein: NEGATIVE
RBC,UR: 4 /HPF (ref 0–5)
Specific Gravity: 1.018 (ref 1.003–1.030)
Squamous Epithelial: 1
WBC UR: 1 /HPF (ref 0–5)

## 2013-12-10 LAB — CBC
HCT: 40.3 % (ref 40.0–52.0)
HGB: 13.3 g/dL (ref 13.0–18.0)
MCH: 30.7 pg (ref 26.0–34.0)
MCHC: 33 g/dL (ref 32.0–36.0)
MCV: 93 fL (ref 80–100)
PLATELETS: 224 10*3/uL (ref 150–440)
RBC: 4.33 10*6/uL — ABNORMAL LOW (ref 4.40–5.90)
RDW: 14.8 % — ABNORMAL HIGH (ref 11.5–14.5)
WBC: 10 10*3/uL (ref 3.8–10.6)

## 2013-12-10 LAB — COMPREHENSIVE METABOLIC PANEL
ALBUMIN: 4.1 g/dL (ref 3.4–5.0)
Alkaline Phosphatase: 84 U/L
Anion Gap: 5 — ABNORMAL LOW (ref 7–16)
BUN: 36 mg/dL — ABNORMAL HIGH (ref 7–18)
Bilirubin,Total: 1 mg/dL (ref 0.2–1.0)
CALCIUM: 9.1 mg/dL (ref 8.5–10.1)
CHLORIDE: 106 mmol/L (ref 98–107)
CO2: 29 mmol/L (ref 21–32)
Creatinine: 1.16 mg/dL (ref 0.60–1.30)
EGFR (African American): >60
EGFR (Non-African Amer.): >60
GLUCOSE: 89 mg/dL (ref 65–99)
Osmolality: 287 (ref 275–301)
Potassium: 4.4 mmol/L (ref 3.5–5.1)
SGOT(AST): 27 U/L (ref 15–37)
SGPT (ALT): 26 U/L
Sodium: 140 mmol/L (ref 136–145)
Total Protein: 7.4 g/dL (ref 6.4–8.2)

## 2013-12-10 LAB — TROPONIN I

## 2013-12-13 ENCOUNTER — Ambulatory Visit: Payer: Self-pay | Admitting: Neurology

## 2014-05-28 NOTE — Consult Note (Signed)
Psychiatry: Hunter Willis seen and chart reviewed. Hunter Willis awake and alert and with no new complaints. Continues to report visual hallucinations of abstract shapes but he also still describes them as being from the [place]. Affect euthymic and does not appear distressed. not increase Seroquel today. Don't want to oversedate him too quickly. So far seems to have tolerated medication. Education done with Hunter Willis. Will follow.  Electronic Signatures: Clapacs, Jackquline DenmarkJohn T (MD)  (Signed on 10-Nov-15 21:17)  Authored  Last Updated: 10-Nov-15 21:17 by Audery Amellapacs, John T (MD)

## 2014-05-28 NOTE — Discharge Summary (Signed)
PATIENT NAME:  Hunter RecordsHARTLEY, Gilberto D MR#:  [id number] DATE OF BIRTH:  1940-09-03  DATE OF ADMISSION:  12/10/2013 DATE OF DISCHARGE:  12/15/2013  ADMITTING DIAGNOSES: Weakness and visual hallucinations.   DISCHARGE DIAGNOSES:  1.  Weakness, visual hallucinations, possibly related to his worsening Parkinson disease with behavioral disturbances, possible due to his medications. Possibly due to over ingestion of over-the-counter restless leg medication.  2.  Gastroesophageal reflux disease.  3.  Benign prostatic hypertrophy.   CONSULTANTS: Audery AmelJohn T. Clapacs, MD, Pauletta BrownsYuriy Zeylikman, MD   PERTINENT LABORATORIES AND EVALUATIONS: Admitting glucose 89, BUN 36, creatinine 1.16, sodium 140, potassium 4.4, calcium 9.1. LFTs are normal. Troponin less than 0.02. WBC 10, hemoglobin 13, platelet count is 224,000. Urinalysis: Nitrites negative, leukocytes negative. CT scan of the head without contrast showed no acute intracranial abnormalities, mild atrophy and chronic microvascular ischemic changes.   HOSPITAL COURSE: Please refer to H and P done by the admitting physician. The patient is a 74 year old white male with history of GERD, Parkinson's, BPH, who was brought in with generalized weakness. The patient also was having significant hallucinations. He is currently residing by himself; however, he has somebody to look after him. His wife had recently been in a skilled nursing facility for a broken ankle. The patient was brought in and he continued to complain of visual hallucinations, seeing the devil. There was a neurology consult as well as psychiatric evaluation and his medications were adjusted. His hallucinations are improved. He will need continued outpatient neurology followup and adjustment of his medication. Currently, he is stable. He needs further rehabilitation and therapy. He is being discharged to a skilled nursing facility.   DISCHARGE MEDICATIONS: Zofran 4 mg 1 tab p.o. b.i.d., atenolol 25 mg 1 tab p.o.  b.i.d., glycopyrrolate 1 mg b.i.d., carbidopa/levodopa 25/100 mg 0.5 t.i.d., benztropine 0.5 mg 1 tab p.o. b.i.d., pramipexole 0.5 mg daily, donepezil 10 mg at bedtime, quetiapine 25 mg 1 tab p.o. b.i.d., quetiapine 50 mg at bedtime.   DISCHARGE DIET: Low sodium.   ACTIVITY: As tolerated. PT evaluation and treatment.   FOLLOWUP: Follow with Dr. Sherryll BurgerShah of neurology in 1 to 2 weeks. Follow-up with skilled nursing facility in 1 to 2 weeks.   TIME SPENT: 35 minutes.    ____________________________ Lacie ScottsShreyang H. Allena KatzPatel, MD shp:ts D: 12/15/2013 12:13:00 ET T: 12/15/2013 13:29:57 ET JOB#: [id number]  cc: Gaytha Raybourn H. Allena KatzPatel, MD, <Dictator> Charise CarwinSHREYANG H Aaidyn San MD ELECTRONICALLY SIGNED 12/22/2013 19:32

## 2014-05-28 NOTE — Consult Note (Signed)
Psychiatry: Patient seen. He is continuing to have visual hallucinations but does not appear to be very disturbed by it. Affect euthymic. Able to carry on conversation with family. Appears to be tolerating Seroquel fine, not oversedated. 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  Electronic Signatures: Audery Amellapacs, Lanyla Costello T (MD)  (Signed on 11-Nov-15 21:43)  Authored  Last Updated: 11-Nov-15 21:43 by Audery Amellapacs, Deanda Ruddell T (MD)

## 2014-05-28 NOTE — H&P (Signed)
PATIENT NAME:  Hunter Willis, [name] MR#:  [id number] DATE OF BIRTH:  1940/12/28  DATE OF ADMISSION:  12/10/2013  REFERRING PHYSICIAN:  Kathreen DevoidKevin A. Paduchowski, MD.    PRIMARY CARE PHYSICIAN: Dr. Para Marchuncan.   NEUROLOGY:  Dr. Sherryll BurgerShah.   CHIEF COMPLAINT: Weakness.    HISTORY OF PRESENT ILLNESS: A 74 year old Caucasian gentleman with past medical history of gastroesophageal disease, Parkinson's as well as BPH, presenting with generalized weakness. He is a remarkably poor historian given current mental status. Daughter at bedside, aides in history.  Currently states he lives by himself since his wife has been in rehabilitation for a broken ankle and she apparently takes care of all the home medications so he has been out of all of his medications for a minimum of 2 weeks.  According to his daughter, generalized weakness, which has been gradually worsening over the last 2 days duration.  Today, he was actually unable to stand from the toilet and unable to ambulate.  Denies any fevers, chills, further symptomatology. However, does denote having 1 day duration of visual hallucinations. He states that he is seeing the devil who happens to be wearing cowboy boots.  He also does attest to having increased salt and liquid intake lately. Has been complaining of lower extremity edema for a few weeks duration; however, no chest pain, orthopnea or PND, dyspnea on exertion.   REVIEW OF SYSTEMS:   Questions the validity of these statements, given his current mental status.   CONSTITUTIONAL: Positive for weakness, fatigue.  Denies fevers or chills.   EYES:  Denies blurred vision, double vision, or eye pain.  ENT: Denies tinnitus, ear pain or hearing loss.  RESPIRATORY: Denies cough, wheeze, shortness of breath.  CARDIOVASCULAR: Denies chest pain, palpitations.  Positive for edema.  GASTROINTESTINAL: Denies nausea, vomiting, diarrhea or abdominal pain.  GENITOURINARY: Denies dysuria, hematuria.  ENDOCRINE: Denies nocturia or  thyroid problems.  HEMATOLOGIC AND LYMPHATIC: Denies easy bruising or bleeding.  SKIN: Denies rashes or lesions.  MUSCULOSKELETAL: Denies pain in neck, back, shoulder, knees, hips or arthritic symptoms.  NEUROLOGIC: Denies paralysis, paresthesias.  PSYCHIATRIC: Positive for visual hallucinations; however, denies anxiety or depressive symptoms.  Denies any homicidal or suicidal ideation.   PAST MEDICAL HISTORY: Gastroesophageal reflux disease, Parkinson's as well as BPH.   SOCIAL HISTORY: No alcohol, tobacco, or drug usage. Uses a cane for ambulation. Currently lives alone as his wife is in a rehabilitation facility.   FAMILY HISTORY: Denies any known cardiovascular or pulmonary disorders.   ALLERGIES: No known drug allergies.   HOME MEDICATIONS: Include benztropine 0.5 mg 2 tablets p.o. b.i.d., carbidopa/levodopa 25/100 mg half tablet 3 times daily, pramipexole 0.5 mg p.o. at bedtime, atenolol 25 mg p.o. b.i.d., Coreg 25 mg p.o. 3 times daily, donepezil 5 mg p.o. at bedtime, glycopyrrolate 1 mg p.o. b.i.d.   PHYSICAL EXAMINATION:  VITAL SIGNS: Temperature 97.5, heart rate 100, respirations 18, blood pressure 154/74, saturating 98% on room air. Weight 90.4 kg, BMI 25.8.  GENERAL: Somewhat disheveled Caucasian gentleman, currently in no acute distress.  HEAD: Normocephalic, atraumatic.  EYES: Pupils equal, round, and reactive to light. Extraocular muscles intact. No scleral icterus. MOUTH:  Moist mucosal membrane. Dentition intact. No abscess noted.  EARS, NOSE, AND THROAT: Clear, without exudates. No external lesions.  NECK: Supple. No thyromegaly. No nodules. No JVD.  PULMONARY: Clear to auscultation bilaterally without wheezes, rales or rhonchi. No use of accessory muscles. Good respiratory effort.  CHEST: Nontender to palpation.  CARDIOVASCULAR: S1, S2. Regular rate  and rhythm. A 2/6 systolic ejection murmur best heard t right right upper sternal border.  He has 2+ edema to the shins  bilaterally.  Pedal pulses 2+ bilaterally.  GASTROINTESTINAL: Soft, nontender, nondistended.  No masses palpable.  Positive bowel sounds. No hepatosplenomegaly.  MUSCULOSKELETAL: No swelling, clubbing, positive for edema as described above.   NEUROLOGIC: Cranial nerves II through XII intact. He does have a resting tremor which is very prominent in his upper extremities.  Unable to assess sensation as he had difficulty following commands at the time.  The distal strength in the lower extremities appears to be intact; however, states that he has difficulty raising his legs bilaterally as well as proximal strength is difficult to assess at the time.  SKIN: No ulceration, lesions, rashes, cyanosis. Skin warm, dry. Turgor intact.  PSYCHIATRIC: Mood and affect blunted.  He is awake, alert and oriented to person as well as place. He has a tangential speech pattern and he speaks of visual hallucinations of the devil. Insight and judgment appear to be poor at this time.   LABORATORY DATA: Sodium 140, potassium 4.4, chloride 106, bicarbonate 29, BUN 36, creatinine 1.16, glucose 89. LFTs within normal limits. WBC of 10, hemoglobin is 13.3, platelets of 224,000. Urinalysis negative for evidence of infection.   ASSESSMENT AND PLAN: A 74 year old Caucasian gentleman with a history of gastroesophageal reflux disease, Parkinson's as well as benign prostatic hypertrophy presenting with generalized weakness and hallucinations.  1. Generalized weakness, question if this is in relation to lack of his Parkinson's medications as well as culmination of lower extremity edema. We will get a physical therapy evaluation in hopes to ambulate him at least to the level where he was before.  He has no signs of overt heart problems.  The lower extremity edema is likely related to increased salt intake.  2. Visual hallucinations. No homicidal or suicidal ideation.  We will get a psychiatry consult.  3. Parkinson's.  We will restart his  home medications and consult neurology as he follows with Dr. Sherryll Burger on a regular basis.   4. Deep venous thrombosis prophylaxis with heparin subcutaneous.    The patient is a full code.   TIME SPENT:  45 minutes.      ____________________________ Cletis Athens. Hower, MD dkh:by D: 12/10/2013 21:00:41 ET T: 12/10/2013 22:15:53 ET JOB#: [id number]  cc: Cletis Athens. Hower, MD, <Dictator> DAVID Synetta Shadow MD ELECTRONICALLY SIGNED 12/11/2013 1:43

## 2014-05-28 NOTE — Consult Note (Signed)
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   Patient was seen by consultant.   Consult note dictated.   Orders entered.   Comments: Psychiatry: Patient seen. Chart reviewed, note dictated. Patient with recent onset of visual hallucinations and worsening confusion. Presentation consistant with Parkinson's dementia psychosis. Could be Lewey and/or Alzheimers. Will increase aricept to 10mg  and increase daily dose of Seroquel and follow.  Electronic Signatures: Audery Amellapacs, Shadi Sessler T (MD)  (Signed [phone]-Nov-15 12:59)  Authored: Brief Consult Note   Last Updated: 09-Nov-15 12:59 by Audery Amellapacs, Benedetta Sundstrom T (MD)

## 2014-05-28 NOTE — Consult Note (Signed)
PATIENT NAME:  Lyn RecordsHARTLEY, Hunter D MR#:  Willis DATE OF BIRTH:  08/09/1940  DATE OF CONSULTATION:  12/13/2013  REFERRING PHYSICIAN:   CONSULTING PHYSICIAN:  Hunter AmelJohn T. Clapacs, MD  IDENTIFYING INFORMATION AND REASON FOR CONSULTATION: This is a 74 year old man with a history of Parkinson disease, who was brought into the hospital with a chief complaint of weakness. Consult is for hallucinations.   HISTORY OF PRESENT ILLNESS: Information obtained from the patient and the chart. He was brought to the hospital on the sixth with generalize weakness. Family reports that he has been getting increasingly weak, unable to stand at home. Additionally, in the hospital, the patient stated that he was having visual hallucinations. On my interview today, the patient states that the symptoms started last Thursday before he had come into the hospital. York SpanielSaid that he was seeing a person in his house when he came downstairs. He started to question the person and became angry and then the person just vanished and disappeared. His history is a little confused as far as the time frame of these symptoms. He says that since he has been here in the hospital he has also seen a figure moving around the room at times. At some points he identifies it as being the devil, at other points he just says that it looks like something. He says that his mood has been okay at home. Does not feel depressed. Says he sleeps fine. Appetite is about stable. He denies any suicidal ideation. He is not aware of having had any change in his medicine recently.   PAST PSYCHIATRIC HISTORY: No known past psychiatric history identified. No history of psychiatric hospitalization. No history of suicidal behavior.   SOCIAL HISTORY: The patient's wife has been in a rehabilitation facility recently suffering from a broken ankles. There is some family who checks in on him regularly. The patient says that he does not have any children of his own, but has nieces and  nephews who sometimes come by and check on him. He is retired from having worked for [employer] previously.   FAMILY HISTORY: Knows of no family history of mental illness.   PAST MEDICAL HISTORY: The patient has Parkinson disease. Also has high blood pressure. He says that he has bad restless leg syndrome.   CURRENT MEDICATIONS: Cogentin 1 mg twice a day before it was just recently decreased by neurology. Carbidopa 25/100 tablets, half of a tablet 3 times a day. Aricept 5 mg a day, Robinul 1 mg twice a day, Mirapex 0.5 mg once a day, quetiapine 25 mg twice a day had been started prior to my coming in.   ALLERGIES: No known drug allergies.   REVIEW OF SYSTEMS: Visual hallucinations. Denies auditory hallucinations. Mood is good, no complaint of depression. No complaint of suicidality. No pain reported. Feeling a little bit fatigued. Otherwise negative.   MENTAL STATUS EXAMINATION: Elderly gentleman who looks his stated age or older, lying in bed. Awake and cooperative. Eye contact intermittent. Psychomotor activity very slow with a tremor typical of Parkinson disease, but speech, although slow, is fluent. Affect is a little bit constricted. Mood stated as being fine. Thoughts a little bit disorganized, although it is inconsistent. Sometimes he will wander off topic quite a bit, sometimes he seems to not get my question at all. The patient is alert and oriented. Can recall 3/3 objects immediately, 1/3 at three minutes. Judgment and insight partial.   LABORATORY RESULTS: Chemistry panel: Elevated BUN at 36. CBC:  Elevated red count 4.3, otherwise normal.  Urinalysis: White blood cell 1, blood 1+, some ketones.   VITAL SIGNS: Blood pressure 159/76, respirations 18, pulse 63, temperature 98.   ASSESSMENT: This is a 74 year old man with a history of Parkinson disease and some previously identified dementia, who is now reporting visual hallucinations. Memory seems to be a little bit more  confused than it had been. Possibly could be medication related. It could be hallucinations related to the Parkinson disease or Lewy body dementia related to Parkinson's.   TREATMENT PLAN: I see that neurology had made some adjustments in his medication. I am going to increase his Seroquel by adding 50 mg of Seroquel at night as well as the 25 twice a day. Increase Aricept to 10 mg a day. We will follow as needed.   DIAGNOSIS, PRINCIPAL AND PRIMARY:  AXIS I:  1.  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  2.  Dementia, mixed type.  AXIS II: Deferred.  AXIS III: Parkinson disease.    ____________________________ Hunter Amel, MD jtc:TT D: 12/13/2013 20:56:43 ET T: 12/13/2013 21:06:53 ET JOB#: [id number]  cc: Hunter Amel, MD, <Dictator> Hunter Amel MD ELECTRONICALLY SIGNED 12/20/2013 17:02

## 2014-05-28 NOTE — Consult Note (Signed)
PATIENT NAME:  Hunter Willis, Hunter Willis MR#:  Willis DATE OF BIRTH:  02-15-1940  DATE OF CONSULTATION:  12/13/2013  REFERRING PHYSICIAN:   CONSULTING PHYSICIAN:  Hunter BrownsYuriy Machell Wirthlin, MD  REASON FOR CONSULTATION: Parkinson disease with agitation and hallucinations.  HISTORY OF PRESENT ILLNESS: This is a 74 year old gentleman with past medical history of gastroesophageal recently and Parkinson disease, as per the patient, for the past 5 years, being treated by Dr. Sherryll BurgerShah, history of BPH. presented with generalized weakness. There was a questionable history of the patient not taking his medications for 2 weeks prior to admission. Denies any fever, any chills, and presenting with 1 day history of visual hallucinations. Currently, the patient is started back on his Sinemet, which is 0.5 mg t.i.Willis., and complaining of seeing the devil in his bed as well as complaining that he sees his waste basket in the room wiggling.  Laboratory work-up has been reviewed. Medications have been reviewed. The patient is on Sinemet 2500 t.i.Willis. He is also on Cogentin 1 mg oral b.i.Willis. as well as Mirapex 0.5 daily.  REVIEW OF SYSTEMS: The patient is not complaining of any pain, neck pain or shortness of breath. No abdominal pain. No chest pain. No weakness on one side of the body compared to the other.   PHYSICAL EXAMINATION: Neurologic: The patient tells me his name, tells me the month. He is able tell me where he is currently located. Extraocular movements intact. Resting tremor is observed. Facial sensation intact. Tongue is midline. Uvula elevates symmetrically. Shoulder shrug intact. Motor strength appears to be generalized weakness, 4/5 to 4+/5 bilateral upper and lower extremities. Reflexes diminished. Sensation intact to light touch and temperature. Coordination: Finger-to-nose intact in the upper extremities. Lower extremities not examined. Gait could not be assessed.   IMPRESSION: A 74 year old male with past medical history of  gastroesophageal reflux disease and progressive Parkinson disease, on Cogentin, Mirapex and Sinemet presenting with falls and hallucinations. The patient states he sees the devil in his bed as well as the waste basket moving around. In terms of his examination, the patient does have increased tone and does have a resting tremor present. Dopaminergic medications that the patient is being treated for Parkinson disease do cause and have potent C-4 hallucinations.   PLAN: We will continue his Sinemet 0.5 mg t.i.Willis., continue the same dose of Mirapex. Will decrease benztropine or Cogentin to 1 mg daily.   Thank you, it was a pleasure seeing this patient. Please call with any questions.   ____________________________ Hunter BrownsYuriy Mona Ayars, MD yz:sb Willis: 12/13/2013 13:15:02 ET T: 12/13/2013 13:53:21 ET JOB#: [id number]  cc: Hunter BrownsYuriy Etienne Mowers, MD, <Dictator> Hunter BrownsYURIY Sheriden Archibeque MD ELECTRONICALLY SIGNED 01/03/2014 13:38

## 2014-07-23 ENCOUNTER — Other Ambulatory Visit
Admission: RE | Admit: 2014-07-23 | Discharge: 2014-07-23 | Disposition: A | Discharge status: Home / Self Care | Payer: Medicare Other | Source: Other Acute Inpatient Hospital | Attending: Specialist | Admitting: Specialist

## 2014-07-23 DIAGNOSIS — G20 Parkinson's disease: Secondary | ICD-10-CM | POA: Insufficient documentation

## 2014-07-23 LAB — BASIC METABOLIC PANEL
ANION GAP: 12 (ref 5–15)
BUN: 40 mg/dL — ABNORMAL HIGH (ref 6–20)
CO2: 26 mmol/L (ref 22–32)
Calcium: 9.4 mg/dL (ref 8.9–10.3)
Chloride: 101 mmol/L (ref 101–111)
Creatinine, Ser: 1.19 mg/dL (ref 0.61–1.24)
GFR calc Af Amer: >60 mL/min (ref >60)
GFR, EST NON AFRICAN AMERICAN: 58 mL/min — AB (ref >60)
GLUCOSE: 98 mg/dL (ref 65–99)
POTASSIUM: 3.9 mmol/L (ref 3.5–5.1)
SODIUM: 139 mmol/L (ref 135–145)

## 2014-08-03 ENCOUNTER — Other Ambulatory Visit: Payer: Self-pay | Admitting: Ophthalmology

## 2014-08-03 DIAGNOSIS — H53133 Sudden visual loss, bilateral: Secondary | ICD-10-CM

## 2014-08-03 DIAGNOSIS — G459 Transient cerebral ischemic attack, unspecified: Secondary | ICD-10-CM

## 2014-08-11 ENCOUNTER — Ambulatory Visit: Payer: Medicare Other

## 2014-09-04 ENCOUNTER — Other Ambulatory Visit: Payer: Self-pay | Admitting: Family Medicine

## 2014-09-04 DIAGNOSIS — E785 Hyperlipidemia, unspecified: Secondary | ICD-10-CM

## 2014-09-05 ENCOUNTER — Other Ambulatory Visit: Payer: Medicare Other

## 2014-09-05 ENCOUNTER — Ambulatory Visit
Admission: RE | Admit: 2014-09-05 | Discharge: 2014-09-05 | Disposition: A | Discharge status: Home / Self Care | Payer: Medicare Other | Source: Ambulatory Visit | Attending: Ophthalmology | Admitting: Ophthalmology

## 2014-09-05 DIAGNOSIS — G459 Transient cerebral ischemic attack, unspecified: Secondary | ICD-10-CM | POA: Diagnosis not present

## 2014-09-05 DIAGNOSIS — H539 Unspecified visual disturbance: Secondary | ICD-10-CM | POA: Diagnosis present

## 2014-09-05 DIAGNOSIS — J329 Chronic sinusitis, unspecified: Secondary | ICD-10-CM | POA: Diagnosis not present

## 2014-09-05 DIAGNOSIS — I679 Cerebrovascular disease, unspecified: Secondary | ICD-10-CM | POA: Insufficient documentation

## 2014-09-05 DIAGNOSIS — H53133 Sudden visual loss, bilateral: Secondary | ICD-10-CM

## 2014-09-05 NOTE — Addendum Note (Signed)
Addended by: Alvina Chou on: 09/05/2014 12:32 PM   Modules accepted: Orders

## 2014-09-05 NOTE — Patient Instructions (Signed)
This appt was opened in error

## 2014-09-09 ENCOUNTER — Encounter: Payer: Medicare Other | Admitting: Family Medicine

## 2014-10-04 ENCOUNTER — Ambulatory Visit: Payer: Medicare Other | Admitting: Family Medicine

## 2014-10-13 ENCOUNTER — Telehealth: Payer: Self-pay

## 2014-10-13 ENCOUNTER — Ambulatory Visit (INDEPENDENT_AMBULATORY_CARE_PROVIDER_SITE_OTHER): Payer: Medicare Other | Admitting: Family Medicine

## 2014-10-13 ENCOUNTER — Encounter: Payer: Self-pay | Admitting: Family Medicine

## 2014-10-13 VITALS — BP 128/62 | HR 80 | Temp 97.9°F | Wt 189.0 lb

## 2014-10-13 DIAGNOSIS — M542 Cervicalgia: Secondary | ICD-10-CM

## 2014-10-13 DIAGNOSIS — G20 Parkinson's disease: Secondary | ICD-10-CM | POA: Diagnosis not present

## 2014-10-13 DIAGNOSIS — R531 Weakness: Secondary | ICD-10-CM

## 2014-10-13 DIAGNOSIS — I10 Essential (primary) hypertension: Secondary | ICD-10-CM | POA: Diagnosis not present

## 2014-10-13 DIAGNOSIS — R2681 Unsteadiness on feet: Secondary | ICD-10-CM

## 2014-10-13 DIAGNOSIS — G20A1 Parkinson's disease without dyskinesia, without mention of fluctuations: Secondary | ICD-10-CM

## 2014-10-13 MED ORDER — BENZTROPINE MESYLATE 0.5 MG PO TABS: 1.0000 mg | ORAL_TABLET | Freq: Two times a day (BID) | ORAL | Status: DC

## 2014-10-13 MED ORDER — FUROSEMIDE 20 MG PO TABS: 20.0000 mg | ORAL_TABLET | Freq: Every day | ORAL | Status: DC | PRN

## 2014-10-13 MED ORDER — ATENOLOL 25 MG PO TABS: 25.0000 mg | ORAL_TABLET | Freq: Two times a day (BID) | ORAL | Status: DC

## 2014-10-13 MED ORDER — CARBIDOPA-LEVODOPA 25-100 MG PO TABS
0.5000 | ORAL_TABLET | Freq: Three times a day (TID) | ORAL | Status: DC
Start: 2014-10-13 — End: 2014-12-01

## 2014-10-13 MED ORDER — POTASSIUM CHLORIDE ER 10 MEQ PO TBCR: 10.0000 meq | EXTENDED_RELEASE_TABLET | Freq: Every day | ORAL | Status: DC

## 2014-10-13 MED ORDER — OMEPRAZOLE 20 MG PO CPDR: 20.0000 mg | DELAYED_RELEASE_CAPSULE | Freq: Every day | ORAL | Status: DC

## 2014-10-13 NOTE — Telephone Encounter (Signed)
Please give the order for [facility] to eval patient.  Needs help with med mgmt, home safety eval, eval for OT/PT at home.  Thanks.

## 2014-10-13 NOTE — Telephone Encounter (Signed)
[facility] left v/m; pt had appt to see Dr Para March 10/13/14; [facility] evaluated pt and did not feel pt was safe in home situation;pt agreed to go to rehab, [facility] got process started for pt to go back to rehab but rehab did not follow thru and pt is still at home. pts care giver said pt is doing better at home now and request home health to come out for reevaluation. [facility] wants to make sure that is OK with Dr Para March. Request cb to [facility]

## 2014-10-13 NOTE — Patient Instructions (Signed)
Tylenol or ibuprofen (with food) for the neck pain.  Icy hot or a heating pad may help.  That looks to be from an ongoing muscle strain Use the list of meds I sent home.  As best I can tell, that is an accurate list of meds.  If you have questions, then call me.  It looks like you have run out of your fluid pill (lasix) and that is why you have more foot swelling.  Take lasix daily if needed for swelling.  Take potassium with the lasix.  Hunter Willis will call about your referral for PT.  Take care.  Glad to see you.

## 2014-10-13 NOTE — Progress Notes (Signed)
Pre visit review using our clinic review tool, if applicable. No additional management support is needed unless otherwise documented below in the visit note.  To reest care, I haven't seen patient in about 1 year.   Now out of SNF/rehab and back home with full time supervision.  He and caregiver had mult questions about meds.  Had mult incomplete/inaccurate med lists and expired meds.   Continues with parkinsons managed by neuro.  Larey Seat recently at home, was trying to flush the toilet and toppled over w/o injury, no LOC.  Usually using a walker at home.  No dysphagia known, reported.  He is tearful discussing his PD dx.  He continues with tremor in ext > jaw.   He continues to have chronic neck pain, esp on the L side.  Likely related to posture and parkinsons, with his neck chronically forward flexed with compensatory upward gaze.  He has been started on fentanyl patch in the meantime.    Recently with BLE edema, has likely been off K and lasix recently.    PMH and SH reviewed  ROS: See HPI, otherwise noncontributory.  Meds, vitals, and allergies reviewed.   Tearful discussing PD nad o/w Nasal exam with clear rhinorrhea OP wnl Neck supple, no LA, neck chronically forward flexed with compensatory upward gaze, not ttp in midline but ttp along L trap w/o bruising or rash.  rrr ctab abd soft Ext with 1+ BLE edema.  B ext >jaw tremor noted at rest

## 2014-10-14 DIAGNOSIS — M542 Cervicalgia: Secondary | ICD-10-CM | POA: Insufficient documentation

## 2014-10-14 NOTE — Telephone Encounter (Signed)
He reportedly isn't a candidate for [facility] and wife doesn't want SNF.   Please see if [facility] SW has access to other means of care/support in the home.  Thanks.

## 2014-10-14 NOTE — Assessment & Plan Note (Addendum)
Per neuro, with med list clarified at OV.  >45 minutes spent in face to face time with patient, >50% spent in counselling or coordination of care.  Will need PT, referred for outpatient PT, then f/u phone call came in for [facility] eval.  See following phone call.   Will need continued supervision.  No acute injury from the fall.  Will ask for PT/OT, home eval since recently back home.  Continue walker use with supervision.  I offered my condolences re: PD dx and he accepted that.

## 2014-10-14 NOTE — Telephone Encounter (Signed)
Spoke to pt wife Joyce Gross). She is not ready for pt to go back to SNF.  They have been apart for a year (at two different SNF due to different issues).   Please advise.

## 2014-10-14 NOTE — Telephone Encounter (Signed)
Spoke with Marrion Coy Paramedic) at [facility].  She wanted to clarify some of the information below.  The evaluation that was done for this pt showed that he is not a canidate for [facility] due to the severity of pt weakness and the pt's inability to take care of himself. They feel that the pt should be placed in SNF.   Would you like for me to contact the pt family (if applicable) or [facility]/social work to assist if there is no family to assist with finding a place for the pt.

## 2014-10-14 NOTE — Telephone Encounter (Signed)
Yes, please contact the pt family (if possible) or [facility]/social work to assist if needed to assist with finding a place for the pt.  Thanks.

## 2014-10-14 NOTE — Assessment & Plan Note (Signed)
Chronic, likely postural component, see AVS re: ibuprofen and heat for muscle pain.

## 2014-10-14 NOTE — Assessment & Plan Note (Signed)
Continue BB, restart K and lasix, rx sent.

## 2014-10-18 ENCOUNTER — Other Ambulatory Visit: Payer: Self-pay

## 2014-10-18 MED ORDER — FENTANYL 12 MCG/HR TD PT72: 12.5000 ug | MEDICATED_PATCH | TRANSDERMAL | Status: DC

## 2014-10-18 NOTE — Telephone Encounter (Signed)
Printed 2 week supply of fentanyl and in Kim's box ...would like to get PCP input prior to them picking up. He should be on tonight. Will also defer next 2 questions to PCP.

## 2014-10-18 NOTE — Telephone Encounter (Signed)
Will wait for Dr. Algis Downs to call Hunter Willis.

## 2014-10-18 NOTE — Patient Outreach (Signed)
[facility]) Care Management  10/18/2014  Hunter Willis 02-Aug-1940 [id number]   Inbound call from Dr. Lianne Bushy office contact Judeth Cornfield.   Referral received on patient for [facility] services.   Issue:  patient discharged to home from SNF on 10/14/14 with home health PT/OT/SN for medication management and HSE services with [facility] contact: Quincy Carnes;  Chestine Spore has contacted Dr. Para March and advised following HH assessment - no plan to provide [facility] as patient is not a candidate for services due to weakness, complications of PD, inability to provide self care and does not have a caregiver.  Wife in the home but also recently discharged from a SNF due to a fractured ankle.   New Referral sent to [facility] Intake / Referral Department for processing.   Donato Schultz, RN, BSN, [facility], CCM  [facility] Management Coordinator [phone] Direct [phone] Cell [phone] Office [phone] Fax

## 2014-10-18 NOTE — Telephone Encounter (Signed)
Otho Bellows left v/m requesting refill for fentanyl patch; last given to pt at nursing home. Last patch put on 10/18/14. Request cb when rx ready for pick up. Pt last seen 10/13/14.  Also wants verification of should pt be taking donepezil 5 mg or 10 mg. The most recent rx from neurology is 10 mg. Noted from 10/13/14 visit discussed meds at length but not sure whether pt should be taking 5 mg or 10 mg. Also pt taking carbidopa-levodopa 25-100 mg taking 1/2 tab tid. Pt experiencing a lot of shaking and for one year while in nursing home pt took carbidopa-levodopa 25-100 mg taking 1 1/2 tab tid. Wants to know since pt shaking if should increase caridopa-levodopa again. Request cb.

## 2014-10-18 NOTE — Telephone Encounter (Signed)
Spoke to [employer] Oceanographer with [facility]). Referral has been sent for [facility] and SW has be sent to [facility] with [facility].  They will have the patient evaluated and find out about what the care plan was intended to be after patient was DC'ed from SNF.

## 2014-10-18 NOTE — Telephone Encounter (Signed)
I need the notes from the prev rx'ing MD about his fentanyl dosing.  His sinemet and donepezil rx's should come through the rx'ing MD.  I haven't filled his sinemet so that should come through neuro.  Thanks.

## 2014-10-19 ENCOUNTER — Other Ambulatory Visit: Payer: Self-pay

## 2014-10-19 NOTE — Patient Outreach (Signed)
[facility]) Care Management  10/19/2014  Hunter Willis 22-Feb-1940 [id number]   [facility] Consult Note:   Case reviewed for possible [facility] referral eligibility.  Patient ineligible for [facility] services due to specific [facility] plan is not in contract with [facility].  RN CM contacted Dr. Josefine Class office contact: Eldridge Dace Cairrikier, CMA with the following recommendations. [name] will discuss with Dr. [name] and send order to [facility].    Referral to [facility] for SN, PT, OT, HSE, Medication Management, SW and CNA  SN:   1)  Medication Management: -Assess, teach and monitor medication management and establish med management system that best meets patients needs.  -Medication reconciliation -Provide caregiver/wife education and instruction on medication management.  -Explore other resources / tools as needed to manage medications such as referral to [facility] if patient and caregiver are unable to manage medications.   2)  Fall Risk Prevention 3)  HSE 4)  Assess if patients LOC needs are being met in the home and assess for other resource options to manage home environment and / or discuss placement needs.    PT/OT:   H/o patient recently discharged from SNF with weakness.  Assess, monitor and teach muscle strengthening, ambulation, safety and fall prevention.  Assess home for home DME needs.   CNA  2-3 times a week  SW: Chiropodist.  Assess goals of care and assist with placement needs as needed.  Patient may need LT placement, ALF or ILF service assistance.  Assist with Medicaid PCS application  Explore if patient qualifies for Medicaid Money Follows the Patient program (was patient in SNF for at least 3 months?).  Assist with Meals on Wheels and / or other resource needs as identified.  Assess Transportation and ability to make MD appointments.  Dr. Josefine Class office to review insurance and verify if patient has coverage with [facility] Dual SNP.   (Patient currently shows [place] Medicare and [place] MCD.  - Referral to [facility] for Case Management services if appropriate.  Customer Service # [phone]. Home Health to follow through on referral   Referrals: Dr. Josefine Class office to review insurance and verify if patient has coverage with [facility] Dual SNP.  (Patient currently shows [place] Medicare and [place] MCD.  - Referral to [facility] for Case Management services if appropriate.  Customer Service # [phone]. Home Health to follow through on referral   Mariann Laster, RN, BSN, [facility] [place] Management Care Management Coordinator [phone] Direct [phone] Cell [phone] Office [phone] Fax

## 2014-10-19 NOTE — Telephone Encounter (Signed)
Spoke to [employer] with [facility]. Notes are entered per guidelines.  Ordered HH and requested [facility] to go out and eval/assess. They are more likely to take on the more difficult cases.   If there any questions about the order.   My desk # is [phone] Edison Nasuti).

## 2014-10-19 NOTE — Telephone Encounter (Signed)
Other Referrals: Please review insurance and verify if patient has coverage with [employer] Dual SNP.  - Referral to [facility] for Case Management services. Customer Service # [phone].  RN CM contacted Dr. Lianne Bushy office contact  Donato Schultz, RN, BSN, [facility], CCM  [facility] Care Management Care Management Coordinator [phone] Direct [phone] Cell [phone] Office [phone] Fax

## 2014-10-19 NOTE — Telephone Encounter (Signed)
Spoke with patient's wife, Joyce Gross. She states that his Fentanyl was previously prescribed by Dr. Katrinka Blazing at the [facility] nursing home.  I notified patient that the Sinimet and Donepezil should come from neuro, so she would need to get in contact with them about those medications.

## 2014-10-19 NOTE — Telephone Encounter (Addendum)
I appreciate help of all involved.  Will await HH input.   Thanks.

## 2014-10-19 NOTE — Addendum Note (Signed)
Addended by: Tonye Becket on: 10/19/2014 12:27 PM   Modules accepted: Orders

## 2014-10-19 NOTE — Patient Outreach (Signed)
[facility]) Care Management  10/19/2014  Hunter Willis 01-16-1941 [id number]   Notification from Donato Schultz, RN to close case due to patient is not eligible for [facility] Care Management services.  Thanks, Corrie Mckusick. Sharlee Blew [facility] Care Management [facility] CM Assistant Phone: [phone] Fax: [phone]

## 2014-10-19 NOTE — Addendum Note (Signed)
Addended by: Tonye Becket on: 10/19/2014 12:34 PM   Modules accepted: Orders

## 2014-10-20 ENCOUNTER — Other Ambulatory Visit: Payer: Self-pay

## 2014-10-20 NOTE — Patient Outreach (Signed)
[facility]) Care Management  10/20/2014  TAVION SENKBEIL 1940/04/24 [id number]  Inbound call received back from [facility].  RN CM requested to send referral to [facility] for patient who needs CM services.   Synetta Fail states she will follow-up to see who should receive this referral and call RN CM back with this information.    Donato Schultz, RN, BSN, [facility], CCM  [facility] Management Coordinator [phone] Direct [phone] Cell [phone] Office [phone] Fax

## 2014-10-20 NOTE — Patient Outreach (Signed)
[facility]) Care Management  10/20/2014  NAHIEM DREDGE 1940-03-12 [id number]  In-basket update received from Tonye Becket, CMA / Dr. Lianne Bushy office.  States referral for [facility] sent to [facility] who is out of network with [facility] for SN services.    RN CM provided the following recommendation.  RN CM contacted [name] to confirm in network with [employer]- MCR;  YES.  RN CM provided [place] contact information for in-network referral via in-basket response.   [phone] main # [id number] Intake / Referral #  Donato Schultz, RN, BSN, [facility], CCM  [facility] Care Management Care Management Coordinator [phone] Direct [phone] Cell [phone] Office [phone] Fax

## 2014-10-20 NOTE — Telephone Encounter (Signed)
Okay to give rx to patient in meantime.

## 2014-10-20 NOTE — Telephone Encounter (Signed)
V/M left that Hunter Willis will pick up fentanyl patch at front desk for pt. Pt nor pts wife is able to come to office.

## 2014-10-20 NOTE — Telephone Encounter (Signed)
Please see if you can get any records about his Fentanyl that was previously prescribed by Dr. Katrinka Blazing at the [facility] nursing home.  We shouldn't need record release since we are the primary and he was discharged to follow up here.  Thanks.

## 2014-10-20 NOTE — Telephone Encounter (Signed)
Notified patient that Rx was here ready to be picked up.  Will try to obtain records from Dr. Katrinka Blazing

## 2014-10-25 ENCOUNTER — Other Ambulatory Visit: Payer: Self-pay

## 2014-10-25 NOTE — Telephone Encounter (Signed)
Hunter Willis with [facility] pharmacy left v/m; pt thought omeprazole was to be refilled to [facility] pharmacy; last refill was on hx med list and done 2013. Is it OK to refill omeprazole? Pt last seen 10/13/14 and listed under meds ordered for that visit but no quantity or refills given.Please advise.

## 2014-10-26 MED ORDER — OMEPRAZOLE 20 MG PO CPDR: 20.0000 mg | DELAYED_RELEASE_CAPSULE | Freq: Every day | ORAL | Status: DC

## 2014-10-26 NOTE — Telephone Encounter (Signed)
Sent. Thanks.   

## 2014-11-06 ENCOUNTER — Telehealth: Payer: Self-pay | Admitting: Family Medicine

## 2014-11-06 NOTE — Telephone Encounter (Signed)
-----   Message from [facility] sent at 11/03/2014 11:49 AM EDT ----- Hunter Willis is going to Pt home on 11/07/14.  ----- Message -----    From: Joaquim Nam, MD    Sent: 10/19/2014   3:03 PM      To: Ernestine Mcmurray Cairrikier, CMA  Thanks.  At this point, I'm waiting to hear back.   Take care . Clelia Croft  ----- Message -----    From: Ernestine Mcmurray Cairrikier, CMA    Sent: 10/19/2014   2:22 PM      To: Joaquim Nam, MD  I am going to complete the note in my in basket. If I can be of any additional help let me know.

## 2014-11-08 ENCOUNTER — Other Ambulatory Visit: Payer: Self-pay

## 2014-11-08 NOTE — Telephone Encounter (Signed)
V/M left requesting rx fentanyl patch; call when ready for pick up; pt has one patch left. Pt last seen 10/13/14. rx last printed # 5 patch on 10/18/14.

## 2014-11-09 MED ORDER — FENTANYL 12 MCG/HR TD PT72: 12.5000 ug | MEDICATED_PATCH | TRANSDERMAL | Status: DC

## 2014-11-09 NOTE — Telephone Encounter (Signed)
Printed.  I need information about who/when/why the fent patch was started.  I presume it was from the neck pain, but I need outside records to review.  Thanks.

## 2014-11-09 NOTE — Telephone Encounter (Signed)
Advised patient and wife that script is up front ready for pickup. Spoke to [place] with [name] (he was at the home doing an evaluation) and he was advised by patient and wife that this was given a long time ago by Dr. Aliene Altes for chronic neck pain.  Patient's wife stated that they will contact Dr. Alver Fisher office and have them fax outside records to Dr. Para March.  Trey Paula with [name] advised that he is there doing an evaluation on patient and plans on skilled nursing 2 times a week, OT and PT evaluation along with social worker coming out also.

## 2014-11-09 NOTE — Telephone Encounter (Signed)
V/m left requesting cb with status of fentanyl patch.

## 2014-11-10 NOTE — Telephone Encounter (Signed)
Noted. Thanks.

## 2014-11-15 ENCOUNTER — Telehealth: Payer: Self-pay | Admitting: *Deleted

## 2014-11-15 NOTE — Telephone Encounter (Signed)
Left message for Dr. Sherryll Burger or nurse (neurology) to verify whether patient is to continue taking Sinemet, Cogentin, and Fentanyl.  These are not listed in recent neurology consult note.

## 2014-11-15 NOTE — Telephone Encounter (Signed)
Spoke to [place] at Dr. Margaretmary Eddy office.  She will check with Dr. Sherryll Burger  To verify and be back in touch with our office.

## 2014-11-16 NOTE — Telephone Encounter (Signed)
Noted. Thanks.

## 2014-11-16 NOTE — Telephone Encounter (Signed)
Spoke to [place] who verified medications with Dr. Sherryll BurgerShah.  Patient should continue taking Sinemet 1.5 tablets three times daily.  The Cogentin has been discontinued, although she was unable to provide a date.  Dr. Sherryll BurgerShah did not prescribe (and does not prescribe) Fentanyl so she was unable to verify whether the patient is still to be taking this or not.

## 2014-11-18 ENCOUNTER — Telehealth: Payer: Self-pay | Admitting: Family Medicine

## 2014-11-18 NOTE — Telephone Encounter (Signed)
Lm on Hunter Willis's vm and provided verbal orders

## 2014-11-18 NOTE — Telephone Encounter (Signed)
Cala BradfordKimberly from [facility] called - she is asking for verbal orders for PT  2times a week for 7 weeks cb number is [phone] Thank you

## 2014-11-18 NOTE — Telephone Encounter (Signed)
Please give the order.  Thanks.   

## 2014-11-23 ENCOUNTER — Telehealth: Payer: Self-pay | Admitting: Family Medicine

## 2014-11-23 ENCOUNTER — Encounter: Payer: Self-pay | Admitting: Emergency Medicine

## 2014-11-23 ENCOUNTER — Other Ambulatory Visit: Payer: Self-pay

## 2014-11-23 ENCOUNTER — Emergency Department: Payer: Medicare Other

## 2014-11-23 ENCOUNTER — Inpatient Hospital Stay
Admission: EM | Admit: 2014-11-23 | Discharge: 2014-11-25 | DRG: 572 | Disposition: A | Discharge status: Home Health | Payer: Medicare Other | Attending: Internal Medicine | Admitting: Internal Medicine

## 2014-11-23 DIAGNOSIS — L97511 Non-pressure chronic ulcer of other part of right foot limited to breakdown of skin: Secondary | ICD-10-CM | POA: Diagnosis present

## 2014-11-23 DIAGNOSIS — R197 Diarrhea, unspecified: Secondary | ICD-10-CM | POA: Diagnosis present

## 2014-11-23 DIAGNOSIS — G894 Chronic pain syndrome: Secondary | ICD-10-CM | POA: Diagnosis present

## 2014-11-23 DIAGNOSIS — K219 Gastro-esophageal reflux disease without esophagitis: Secondary | ICD-10-CM | POA: Diagnosis present

## 2014-11-23 DIAGNOSIS — G20 Parkinson's disease: Secondary | ICD-10-CM | POA: Diagnosis present

## 2014-11-23 DIAGNOSIS — I10 Essential (primary) hypertension: Secondary | ICD-10-CM | POA: Diagnosis present

## 2014-11-23 DIAGNOSIS — L8990 Pressure ulcer of unspecified site, unspecified stage: Secondary | ICD-10-CM | POA: Insufficient documentation

## 2014-11-23 DIAGNOSIS — N401 Enlarged prostate with lower urinary tract symptoms: Secondary | ICD-10-CM | POA: Diagnosis present

## 2014-11-23 DIAGNOSIS — E785 Hyperlipidemia, unspecified: Secondary | ICD-10-CM | POA: Diagnosis present

## 2014-11-23 DIAGNOSIS — R339 Retention of urine, unspecified: Secondary | ICD-10-CM | POA: Diagnosis present

## 2014-11-23 DIAGNOSIS — L03115 Cellulitis of right lower limb: Secondary | ICD-10-CM | POA: Diagnosis present

## 2014-11-23 DIAGNOSIS — R338 Other retention of urine: Secondary | ICD-10-CM | POA: Diagnosis present

## 2014-11-23 DIAGNOSIS — R32 Unspecified urinary incontinence: Secondary | ICD-10-CM | POA: Diagnosis present

## 2014-11-23 DIAGNOSIS — E559 Vitamin D deficiency, unspecified: Secondary | ICD-10-CM | POA: Diagnosis present

## 2014-11-23 HISTORY — DX: Parkinson's disease: G20

## 2014-11-23 HISTORY — DX: Edema, unspecified: R60.9

## 2014-11-23 HISTORY — DX: Parkinson's disease without dyskinesia, without mention of fluctuations: G20.A1

## 2014-11-23 LAB — URINALYSIS COMPLETE WITH MICROSCOPIC (ARMC ONLY)
BACTERIA UA: NONE SEEN
Bilirubin Urine: NEGATIVE
GLUCOSE, UA: NEGATIVE mg/dL
Hgb urine dipstick: NEGATIVE
KETONES UR: NEGATIVE mg/dL
Leukocytes, UA: NEGATIVE
Nitrite: NEGATIVE
PROTEIN: NEGATIVE mg/dL
Specific Gravity, Urine: 1.015 (ref 1.005–1.030)
pH: 5 (ref 5.0–8.0)

## 2014-11-23 LAB — BASIC METABOLIC PANEL
Anion gap: 8 (ref 5–15)
BUN: 32 mg/dL — ABNORMAL HIGH (ref 6–20)
CHLORIDE: 105 mmol/L (ref 101–111)
CO2: 26 mmol/L (ref 22–32)
CREATININE: 1.18 mg/dL (ref 0.61–1.24)
Calcium: 9.4 mg/dL (ref 8.9–10.3)
GFR calc non Af Amer: 58 mL/min — ABNORMAL LOW (ref >60)
Glucose, Bld: 105 mg/dL — ABNORMAL HIGH (ref 65–99)
POTASSIUM: 4.5 mmol/L (ref 3.5–5.1)
Sodium: 139 mmol/L (ref 135–145)

## 2014-11-23 LAB — CBC
HEMATOCRIT: 36 % — AB (ref 40.0–52.0)
HEMOGLOBIN: 12 g/dL — AB (ref 13.0–18.0)
MCH: 31.5 pg (ref 26.0–34.0)
MCHC: 33.4 g/dL (ref 32.0–36.0)
MCV: 94.4 fL (ref 80.0–100.0)
Platelets: 186 10*3/uL (ref 150–440)
RBC: 3.81 MIL/uL — AB (ref 4.40–5.90)
RDW: 13.6 % (ref 11.5–14.5)
WBC: 8.4 10*3/uL (ref 3.8–10.6)

## 2014-11-23 LAB — HEPATIC FUNCTION PANEL
ALBUMIN: 3.9 g/dL (ref 3.5–5.0)
ALK PHOS: 76 U/L (ref 38–126)
ALT: 6 U/L — AB (ref 17–63)
AST: 21 U/L (ref 15–41)
BILIRUBIN TOTAL: 1.2 mg/dL (ref 0.3–1.2)
Bilirubin, Direct: 0.2 mg/dL (ref 0.1–0.5)
Indirect Bilirubin: 1 mg/dL — ABNORMAL HIGH (ref 0.3–0.9)
Total Protein: 7 g/dL (ref 6.5–8.1)

## 2014-11-23 MED ORDER — CARBIDOPA-LEVODOPA 25-100 MG PO TABS
0.5000 | ORAL_TABLET | Freq: Three times a day (TID) | ORAL | Status: DC
  Administered 2014-11-23: 0.5 via ORAL
  Administered 2014-11-24: 1 via ORAL
  Filled 2014-11-23 (×2): qty 1

## 2014-11-23 MED ORDER — TAMSULOSIN HCL 0.4 MG PO CAPS
0.4000 mg | ORAL_CAPSULE | Freq: Every day | ORAL | Status: DC
  Administered 2014-11-23 – 2014-11-25 (×3): 0.4 mg via ORAL
  Filled 2014-11-23 (×3): qty 1

## 2014-11-23 MED ORDER — FUROSEMIDE 20 MG PO TABS
20.0000 mg | ORAL_TABLET | Freq: Every day | ORAL | Status: DC
  Administered 2014-11-24 – 2014-11-25 (×2): 20 mg via ORAL
  Filled 2014-11-23 (×2): qty 1

## 2014-11-23 MED ORDER — LOPERAMIDE HCL 2 MG PO CAPS: 2.0000 mg | ORAL_CAPSULE | Freq: Four times a day (QID) | ORAL | Status: DC | PRN

## 2014-11-23 MED ORDER — ENOXAPARIN SODIUM 40 MG/0.4ML ~~LOC~~ SOLN
40.0000 mg | SUBCUTANEOUS | Status: DC
  Administered 2014-11-23 – 2014-11-24 (×2): 40 mg via SUBCUTANEOUS
  Filled 2014-11-23 (×2): qty 0.4

## 2014-11-23 MED ORDER — ROTIGOTINE 8 MG/24HR TD PT24: 1.0000 | MEDICATED_PATCH | Freq: Every day | TRANSDERMAL | Status: DC

## 2014-11-23 MED ORDER — PRAMIPEXOLE DIHYDROCHLORIDE 0.25 MG PO TABS
0.5000 mg | ORAL_TABLET | Freq: Every day | ORAL | Status: DC
  Administered 2014-11-23 – 2014-11-24 (×2): 0.5 mg via ORAL
  Filled 2014-11-23 (×2): qty 2

## 2014-11-23 MED ORDER — CARBIDOPA 25 MG PO TABS
25.0000 mg | ORAL_TABLET | Freq: Three times a day (TID) | ORAL | Status: DC
  Administered 2014-11-24: 1 via ORAL

## 2014-11-23 MED ORDER — ATENOLOL 25 MG PO TABS
25.0000 mg | ORAL_TABLET | Freq: Two times a day (BID) | ORAL | Status: DC
  Administered 2014-11-23 – 2014-11-25 (×4): 25 mg via ORAL
  Filled 2014-11-23 (×4): qty 1

## 2014-11-23 MED ORDER — ALBUTEROL SULFATE (2.5 MG/3ML) 0.083% IN NEBU
INHALATION_SOLUTION | RESPIRATORY_TRACT | Status: AC
  Filled 2014-11-23: qty 9

## 2014-11-23 MED ORDER — IBUPROFEN 400 MG PO TABS
400.0000 mg | ORAL_TABLET | Freq: Four times a day (QID) | ORAL | Status: DC | PRN
  Administered 2014-11-24: 400 mg via ORAL
  Filled 2014-11-23: qty 1

## 2014-11-23 MED ORDER — ONDANSETRON HCL 4 MG PO TABS: 4.0000 mg | ORAL_TABLET | Freq: Four times a day (QID) | ORAL | Status: DC | PRN

## 2014-11-23 MED ORDER — ACETAMINOPHEN 325 MG PO TABS: 650.0000 mg | ORAL_TABLET | Freq: Four times a day (QID) | ORAL | Status: DC | PRN

## 2014-11-23 MED ORDER — MELATONIN 3 MG PO TABS: 3.0000 mg | ORAL_TABLET | Freq: Every day | ORAL | Status: DC

## 2014-11-23 MED ORDER — SODIUM CHLORIDE 0.9 % IV SOLN
INTRAVENOUS | Status: DC
  Administered 2014-11-23: 23:00:00 via INTRAVENOUS

## 2014-11-23 MED ORDER — ACETAMINOPHEN 650 MG RE SUPP: 650.0000 mg | Freq: Four times a day (QID) | RECTAL | Status: DC | PRN

## 2014-11-23 MED ORDER — GLYCOPYRROLATE 1 MG PO TABS
1.0000 mg | ORAL_TABLET | Freq: Two times a day (BID) | ORAL | Status: DC
  Administered 2014-11-23 – 2014-11-25 (×4): 1 mg via ORAL
  Filled 2014-11-23 (×5): qty 1

## 2014-11-23 MED ORDER — QUETIAPINE FUMARATE 25 MG PO TABS
50.0000 mg | ORAL_TABLET | Freq: Every day | ORAL | Status: DC
  Administered 2014-11-23 – 2014-11-24 (×2): 50 mg via ORAL
  Filled 2014-11-23 (×2): qty 2

## 2014-11-23 MED ORDER — ALBUTEROL SULFATE (2.5 MG/3ML) 0.083% IN NEBU: 2.5000 mg | INHALATION_SOLUTION | RESPIRATORY_TRACT | Status: DC | PRN

## 2014-11-23 MED ORDER — CLINDAMYCIN PHOSPHATE 900 MG/50ML IV SOLN
900.0000 mg | Freq: Once | INTRAVENOUS | Status: AC
Start: 2014-11-23 — End: 2014-11-23
  Administered 2014-11-23: 900 mg via INTRAVENOUS
  Filled 2014-11-23: qty 50

## 2014-11-23 MED ORDER — ACETAMINOPHEN 325 MG PO TABS
650.0000 mg | ORAL_TABLET | Freq: Four times a day (QID) | ORAL | Status: DC | PRN
  Administered 2014-11-24: 650 mg via ORAL
  Filled 2014-11-23: qty 2

## 2014-11-23 MED ORDER — POTASSIUM CHLORIDE CRYS ER 10 MEQ PO TBCR
10.0000 meq | EXTENDED_RELEASE_TABLET | Freq: Every day | ORAL | Status: DC
  Administered 2014-11-24 – 2014-11-25 (×2): 10 meq via ORAL
  Filled 2014-11-23 (×2): qty 1

## 2014-11-23 MED ORDER — CLINDAMYCIN PHOSPHATE 600 MG/50ML IV SOLN
600.0000 mg | Freq: Three times a day (TID) | INTRAVENOUS | Status: DC
  Administered 2014-11-23 – 2014-11-24 (×2): 600 mg via INTRAVENOUS
  Filled 2014-11-23 (×3): qty 50

## 2014-11-23 MED ORDER — FENTANYL 12 MCG/HR TD PT72
12.5000 ug | MEDICATED_PATCH | TRANSDERMAL | Status: DC
  Administered 2014-11-23: 12.5 ug via TRANSDERMAL
  Filled 2014-11-23: qty 1

## 2014-11-23 MED ORDER — SODIUM CHLORIDE 0.9 % IV BOLUS (SEPSIS)
1000.0000 mL | Freq: Once | INTRAVENOUS | Status: AC
  Administered 2014-11-23: 1000 mL via INTRAVENOUS

## 2014-11-23 MED ORDER — ONDANSETRON HCL 4 MG/2ML IJ SOLN: 4.0000 mg | Freq: Four times a day (QID) | INTRAMUSCULAR | Status: DC | PRN

## 2014-11-23 MED ORDER — ROTIGOTINE 4 MG/24HR TD PT24
2.0000 | MEDICATED_PATCH | Freq: Every day | TRANSDERMAL | Status: DC
  Administered 2014-11-24 – 2014-11-25 (×2): 2 via TRANSDERMAL
  Filled 2014-11-23 (×2): qty 2

## 2014-11-23 MED ORDER — DONEPEZIL HCL 5 MG PO TABS
5.0000 mg | ORAL_TABLET | Freq: Every day | ORAL | Status: DC
  Administered 2014-11-23 – 2014-11-24 (×2): 5 mg via ORAL
  Filled 2014-11-23 (×2): qty 1

## 2014-11-23 MED ORDER — PANTOPRAZOLE SODIUM 40 MG PO TBEC
40.0000 mg | DELAYED_RELEASE_TABLET | Freq: Every day | ORAL | Status: DC
  Administered 2014-11-24 – 2014-11-25 (×2): 40 mg via ORAL
  Filled 2014-11-23 (×2): qty 1

## 2014-11-23 NOTE — Progress Notes (Signed)
PHARMACIST - PHYSICIAN ORDER COMMUNICATION  CONCERNING: P&T Medication Policy on Herbal Medications  DESCRIPTION:  This patient's order for:  melatonin  has been noted.  This product(s) is classified as an "herbal" or natural product. Due to a lack of definitive safety studies or FDA approval, nonstandard manufacturing practices, plus the potential risk of unknown drug-drug interactions while on inpatient medications, the Pharmacy and Therapeutics Committee does not permit the use of "herbal" or natural products of this type within [place].   ACTION TAKEN: The pharmacy department is unable to verify this order at this time. Please reevaluate patient's clinical condition at discharge and address if the herbal or natural product(s) should be resumed at that time.   

## 2014-11-23 NOTE — ED Notes (Signed)
Pt diaper and sheets changed

## 2014-11-23 NOTE — Telephone Encounter (Signed)
No answer at home number. Called and spoke to patient's wife and was advised that they are at [facility] ED now.

## 2014-11-23 NOTE — ED Notes (Signed)
Pt's caregiver, Hilbert CorriganLorrie, can be reached at [phone].

## 2014-11-23 NOTE — ED Provider Notes (Signed)
[facility] Emergency Department Provider Note  ____________________________________________  Time seen: Approximately 3:44 PM  I have reviewed the triage vital signs and the nursing notes.   HISTORY  Chief Complaint Leg Pain    HPI Lyn RecordsJerry D Szumski is a 74 y.o. male with a history of HTN, HL, and BPH presenting with swelling, erythema, and warmth of the right foot and lower leg, diarrhea, and urinary retention. Patient reports that for the past several months she has had an erythematous and swollen right foot. However over last 2-3 days he has developed a blister on the medial aspect of the foot. His home health nurse, describes that his foot changes are more recent. He also reports diarrhea without abdominal pain, and inability to urinate since 11 PM last night. He denies any nausea or vomiting, fever, chills, chest pain, shortness breath, or cough.    Past Medical History  Diagnosis Date  . Allergy   . Hypertension   . GERD (gastroesophageal reflux disease)   . Hyperlipidemia   . Tremor   . BPH (benign prostatic hyperplasia)     followed by Dr. Evelene CroonWolff  . Renal stones     Patient Active Problem List   Diagnosis Date Noted  . Neck pain 10/14/2014  . Advance care planning 09/05/2013  . Closed hip fracture (HCC) 05/31/2013  . Femoral neck fracture (HCC) 05/30/2013  . Hyperglycemia 08/10/2012  . Hyperlipidemia 05/31/2011  . Medicare annual wellness visit, subsequent 05/31/2011  . Back pain 01/04/2011  . Parkinson's disease (HCC) 05/21/2010  . 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 UNSPECIFIED VITAMIN D DEFICIENCY 08/24/2008  . Essential hypertension 07/23/2006  . ALLERGIC RHINITIS 07/23/2006  . EROSIVE ESOPHAGITIS 07/23/2006  . GERD 07/23/2006  . RENAL CALCULUS, RECURRENT 07/23/2006  . BENIGN PROSTATIC HYPERTROPHY, HX OF 07/23/2006    Past Surgical History  Procedure Laterality Date  . Esophagogastroduodenoscopy  01/1998  .  Cystoscopy tumor / condylomata w/ laser  01/26/2001  . Emg  06/23/2001    NCL LE'S wnl, Dr. Kemper Durielarke  . Lithotripsy  10/2003    kidney stone, Dr. Artis FlockWolfe  . Hip arthroplasty Left 05/31/2013    Procedure: LEFT HIP HEMIARTHROPLASTY;  Surgeon: Shelda PalMatthew D Olin, MD;  Location: [facility] OR[facility];  Service: Orthopedics;  Laterality: Left;    Current Outpatient Rx  Name  Route  Sig  Dispense  Refill  . atenolol (TENORMIN) 25 MG tablet   Oral   Take 1 tablet (25 mg total) by mouth 2 (two) times daily.   180 tablet   3   . benztropine (COGENTIN) 0.5 MG tablet   Oral   Take 2 tablets (1 mg total) by mouth 2 (two) times daily.         . Carbidopa 25 MG tablet   Oral   Take 25 mg by mouth 3 (three) times daily.         . carbidopa-levodopa (SINEMET IR) 25-100 MG per tablet   Oral   Take 0.5 tablets by mouth 3 (three) times daily.         Marland Kitchen. donepezil (ARICEPT) 5 MG tablet   Oral   Take 5 mg by mouth at bedtime.         . fentaNYL (DURAGESIC - DOSED MCG/HR) 12 MCG/HR   Transdermal   Place 1 patch (12.5 mcg total) onto the skin every 3 (three) days.   10 patch   0   . furosemide (LASIX) 20 MG tablet   Oral  Take 1 tablet (20 mg total) by mouth daily as needed for fluid.   30 tablet   5   . glycopyrrolate (ROBINUL) 1 MG tablet   Oral   Take 1 mg by mouth 2 (two) times daily.         Marland Kitchen omeprazole (PRILOSEC) 20 MG capsule   Oral   Take 1 capsule (20 mg total) by mouth daily.   30 capsule   5   . potassium chloride (K-DUR) 10 MEQ tablet   Oral   Take 1 tablet (10 mEq total) by mouth daily.   30 tablet   5     Take with furosemide/lasix for fluid   . pramipexole (MIRAPEX) 0.5 MG tablet   Oral   Take 0.5 mg by mouth at bedtime.         Marland Kitchen QUEtiapine (SEROQUEL) 50 MG tablet   Oral   Take 50 mg by mouth at bedtime.         . Rotigotine (NEUPRO) 8 MG/24HR PT24   Transdermal   Place onto the skin.           Allergies Review of patient's allergies indicates no known  allergies.  Family History  Problem Relation Age of Onset  . Diabetes Mother   . Diabetes Sister   . Cancer Brother     prostate, terminal  . Prostate cancer Brother   . Hypertension Brother   . Hypertension Brother   . Hypertension Brother   . Depression Sister   . Cancer Sister     throat  . Heart disease Father   . Heart disease Brother   . Colon cancer Neg Hx     Social History Social History  Substance Use Topics  . Smoking status: Never Smoker   . Smokeless tobacco: None  . Alcohol Use: No    Review of Systems Constitutional: No fever/chills. No lightheadedness no syncope. Eyes: No visual changes. ENT: No sore throat. Cardiovascular: Denies chest pain, palpitations. Respiratory: Denies shortness of breath.  No cough. Gastrointestinal: No abdominal pain.  No nausea, no vomiting.  Positive diarrhea.  No constipation. Genitourinary: Urinary retention. Negative for dysuria. Musculoskeletal: Negative for back pain. Skin: Negative for rash. Neurological: Negative for headaches, focal weakness or numbness.  10-point ROS otherwise negative.  ____________________________________________   PHYSICAL EXAM:  VITAL SIGNS: ED Triage Vitals  Enc Vitals Group     BP --      Pulse --      Resp --      Temp --      Temp src --      SpO2 --      Weight --      Height --      Head Cir --      Peak Flow --      Pain Score --      Pain Loc --      Pain Edu? --      Excl. in GC? --     Constitutional: Patient is chronically ill-appearing with a resting right upper extremity tremor. He has clear speech and is able to answer questions appropriately.  Eyes: Conjunctivae are normal.  EOMI. Head: Atraumatic. Nose: No congestion/rhinnorhea. Mouth/Throat: Mucous membranes are moist.  Neck: No stridor.  Supple.  Trachea is midline.  Exaggerated kyphosis. Cardiovascular: Low rate, regular rhythm. No murmurs, rubs or gallops.  Respiratory: Normal respiratory effort.  No  retractions. Lungs CTAB.  No wheezes, rales or ronchi. Gastrointestinal: Abdomen  is soft, nondistended. He has mild tenderness to palpation of the suprapubic area without palpable bladder. No peritoneal signs.. Musculoskeletal: Right lower extremity has erythema throughout the entire right foot and distal tibia with associated swelling and warmth. There is a intact 2 x 3 cm blister on the medial aspect of the right foot. Normal DP and PT pulses. Normal sensation to light touch. 5 out of 5 plantar and dorsiflexion. Neurologic:  Clear speech, face and smile are symmetric. Extra movements are intact. Moves all remedies well. Skin:  Skin is warm, dry, Psychiatric: Mood and affect are normal. Speech and behavior are normal.  Normal judgement.  ____________________________________________   LABS (all labs ordered are listed, but only abnormal results are displayed)  Labs Reviewed  BASIC METABOLIC PANEL - Abnormal; Notable for the following:    Glucose, Bld 105 (*)    BUN 32 (*)    GFR calc non Af Amer 58 (*)    All other components within normal limits  HEPATIC FUNCTION PANEL - Abnormal; Notable for the following:    ALT 6 (*)    Indirect Bilirubin 1.0 (*)    All other components within normal limits  URINE CULTURE  CBC  URINALYSIS COMPLETEWITH MICROSCOPIC ([facility] ONLY)   ____________________________________________  EKG  Not indicated____________________________________________  RADIOLOGY  US Venous Img Lower Unilateral Right  11/23/2014  CLINICAL DATA:  Right lower extremity swelling for the past 1-2 months. Evaluate for DVT. EXAM: RIGHT LOWER EXTREMITY VENOUS DOPPLER ULTRASOUND TECHNIQUE: Gray-scale sonography with graded compression, as well as color Doppler and duplex ultrasound were performed to evaluate the lower extremity deep venous systems from the level of the common femoral vein and including the common femoral, femoral, profunda femoral, popliteal and calf veins including  the posterior tibial, peroneal and gastrocnemius veins when visible. The superficial great saphenous vein was also interrogated. Spectral Doppler was utilized to evaluate flow at rest and with distal augmentation maneuvers in the common femoral, femoral and popliteal veins. COMPARISON:  None. FINDINGS: Contralateral Common Femoral Vein: Respiratory phasicity is normal and symmetric with the symptomatic side. No evidence of thrombus. Normal compressibility. Common Femoral Vein: No evidence of thrombus. Normal compressibility, respiratory phasicity and response to augmentation. Saphenofemoral Junction: No evidence of thrombus. Normal compressibility and flow on color Doppler imaging. Profunda Femoral Vein: No evidence of thrombus. Normal compressibility and flow on color Doppler imaging. Femoral Vein: No evidence of thrombus. Normal compressibility, respiratory phasicity and response to augmentation. Popliteal Vein: No evidence of thrombus. Normal compressibility, respiratory phasicity and response to augmentation. Calf Veins: No evidence of thrombus. Normal compressibility and flow on color Doppler imaging. Superficial Great Saphenous Vein: No evidence of thrombus. Normal compressibility and flow on color Doppler imaging. Venous Reflux:  None. Other Findings:  None. IMPRESSION: No evidence of DVT within the right lower extremity. Electronically Signed   By: Simonne Come M.D.   On: 11/23/2014 17:02    ____________________________________________   PROCEDURES  Procedure(s) performed: yes  Ultrasound-guided IV placed in the left brachial vein. The area was prepped with chlorhexidine, AND was used to visualize the vein and 20-gauge was placed. The patient tolerated the procedure well.  Critical Care performed: No ____________________________________________   INITIAL IMPRESSION / ASSESSMENT AND PLAN / ED COURSE  Pertinent labs & imaging results that were available during my care of the patient were  reviewed by me and considered in my medical decision making (see chart for details).  74 y.o. male presenting with signs and symptoms in the  right foot and lower extremity that are concerning for cellulitis versus DVT. He also has nonspecific symptoms of diarrhea and urinary retention. We will evaluate him for UTI, although the retention may also be due to his BPH. He will have a postvoid residual with remaining indwelling Foley of residuals are high. Anticipate admission.   ----------------------------------------- 7:35 PM on 11/23/2014 ----------------------------------------- Patient has remained stable. He does not have a DVT, therefore I will treat him for right lower extremity cellulitis. In addition, I am awaiting a postvoid residual and urinalysis. I will plan to admit the patient to the hospitalist service.  ___________________________________  _________  FINAL CLINICAL IMPRESSION(S) / ED DIAGNOSES  Final diagnoses:  Cellulitis of right lower extremity  Diarrhea, unspecified type  Urinary retention      NEW MEDICATIONS STARTED DURING THIS VISIT:  New Prescriptions   No medications on file     Rockne Menghini, MD 11/23/14 1936

## 2014-11-23 NOTE — H&P (Signed)
[facility] [place] at [facility]   PATIENT NAME: Hunter PicketJerry Willis    MR#:  [id number]  DATE OF BIRTH:  03/05/1940  DATE OF ADMISSION:  11/23/2014  PRIMARY CARE PHYSICIAN: Crawford GivensGraham Duncan, MD   REQUESTING/REFERRING PHYSICIAN:   CHIEF COMPLAINT:   Chief Complaint  Patient presents with  . Leg Pain    HISTORY OF PRESENT ILLNESS:  Hunter Willis  is a 74 y.o. male with a known history of Parkinson's disease, hypertension, BPH who embolus with a walker at baseline presents to the emergency room complaining of worsening right lower extremity redness and pain along with the large blister. No fever. No recent antibiotic use. Swelling and redness have slowly worsened over 4 days. Was seen by his home health nurse will send the patient to the emergency room. Patient has received 1 dose of IV clindamycin and is being admitted for extensive right lower extremity cellulitis. He does have pain on movement. Patient has also had worsening diarrhea over 4 days with 15-20 watery stool today. No abdominal pain, nausea, vomiting. Patient has had urinary hesitancy and retention over last 2 days. Patient was unable to wipe in the emergency room and has a Foley catheter placed with 800 mL drained out of it.   PAST MEDICAL HISTORY:   Past Medical History  Diagnosis Date  . Allergy   . Hypertension   . GERD (gastroesophageal reflux disease)   . Hyperlipidemia   . Tremor   . BPH (benign prostatic hyperplasia)     followed by Dr. Evelene CroonWolff  . Renal stones   . Parkinson's disease (HCC)   . Edema     PAST SURGICAL HISTORY:   Past Surgical History  Procedure Laterality Date  . Esophagogastroduodenoscopy  01/1998  . Cystoscopy tumor / condylomata w/ laser  01/26/2001  . Emg  06/23/2001    NCL LE'S wnl, Dr. Kemper Durielarke  . Lithotripsy  10/2003    kidney stone, Dr. Artis FlockWolfe  . Hip arthroplasty Left 05/31/2013    Procedure: LEFT HIP HEMIARTHROPLASTY;  Surgeon: Shelda PalMatthew D Olin, MD;   Location: [facility];  Service: Orthopedics;  Laterality: Left;    SOCIAL HISTORY:   Social History  Substance Use Topics  . Smoking status: Never Smoker   . Smokeless tobacco: Not on file  . Alcohol Use: No    FAMILY HISTORY:   Family History  Problem Relation Age of Onset  . Diabetes Mother   . Diabetes Sister   . Cancer Brother     prostate, terminal  . Prostate cancer Brother   . Hypertension Brother   . Hypertension Brother   . Hypertension Brother   . Depression Sister   . Cancer Sister     throat  . Heart disease Father   . Heart disease Brother   . Colon cancer Neg Hx     DRUG ALLERGIES:  No Known Allergies  REVIEW OF SYSTEMS:   Review of Systems  Constitutional: Positive for malaise/fatigue. Negative for fever, chills and weight loss.  HENT: Negative for hearing loss and nosebleeds.   Eyes: Negative for blurred vision, double vision and pain.  Respiratory: Negative for cough, hemoptysis, sputum production, shortness of breath and wheezing.   Cardiovascular: Negative for chest pain, palpitations, orthopnea and leg swelling.  Gastrointestinal: Positive for diarrhea. Negative for nausea, vomiting, abdominal pain and constipation.  Genitourinary: Positive for dysuria (retention). Negative for hematuria.  Musculoskeletal: Negative for myalgias, back pain and falls.  Skin: Negative for rash.  Neurological:  Positive for tremors. Negative for dizziness, sensory change, speech change, focal weakness, seizures and headaches.  Endo/Heme/Allergies: Does not bruise/bleed easily.  Psychiatric/Behavioral: Negative for depression and memory loss. The patient is not nervous/anxious.     MEDICATIONS AT HOME:   Prior to Admission medications   Medication Sig Start Date End Date Taking? Authorizing Provider  atenolol (TENORMIN) 25 MG tablet Take 1 tablet (25 mg total) by mouth 2 (two) times daily. 10/13/14  Yes Joaquim Nam, MD  benztropine (COGENTIN) 0.5 MG tablet Take 2  tablets (1 mg total) by mouth 2 (two) times daily. 10/13/14  Yes Joaquim Nam, MD  Carbidopa 25 MG tablet Take 25 mg by mouth 3 (three) times daily.   Yes Historical Provider, MD  carbidopa-levodopa (SINEMET IR) 25-100 MG per tablet Take 0.5 tablets by mouth 3 (three) times daily. Patient taking differently: Take 1.5 tablets by mouth 3 (three) times daily.  10/13/14  Yes Joaquim Nam, MD  donepezil (ARICEPT) 5 MG tablet Take 5 mg by mouth at bedtime.   Yes Historical Provider, MD  fentaNYL (DURAGESIC - DOSED MCG/HR) 12 MCG/HR Place 1 patch (12.5 mcg total) onto the skin every 3 (three) days. 11/09/14  Yes Joaquim Nam, MD  furosemide (LASIX) 20 MG tablet Take 20 mg by mouth daily.   Yes Historical Provider, MD  glycopyrrolate (ROBINUL) 1 MG tablet Take 1 mg by mouth 2 (two) times daily.   Yes Historical Provider, MD  ibuprofen (ADVIL,MOTRIN) 200 MG tablet Take 400 mg by mouth every 6 (six) hours as needed for headache or mild pain.   Yes Historical Provider, MD  loperamide (IMODIUM A-D) 2 MG tablet Take 2 mg by mouth 4 (four) times daily as needed for diarrhea or loose stools.   Yes Historical Provider, MD  Melatonin 3 MG TABS Take 3 mg by mouth at bedtime.   Yes Historical Provider, MD  omeprazole (PRILOSEC) 20 MG capsule Take 1 capsule (20 mg total) by mouth daily. 10/26/14  Yes Joaquim Nam, MD  OVER THE COUNTER MEDICATION Take 2 tablets by mouth at bedtime. Pt takes Hyland's--Restful Legs.   Yes Historical Provider, MD  potassium chloride (K-DUR) 10 MEQ tablet Take 1 tablet (10 mEq total) by mouth daily. 10/13/14  Yes Joaquim Nam, MD  pramipexole (MIRAPEX) 0.5 MG tablet Take 0.5 mg by mouth at bedtime.   Yes Historical Provider, MD  QUEtiapine (SEROQUEL) 50 MG tablet Take 50 mg by mouth at bedtime.   Yes Historical Provider, MD  Rotigotine (NEUPRO) 8 MG/24HR PT24 Place 1 patch onto the skin daily.    Yes Historical Provider, MD      VITAL SIGNS:  Blood pressure 131/70, pulse 57,  temperature 97.5 F (36.4 C), temperature source Oral, resp. rate 18, height  (1.956 m), weight 90.719 kg (200 lb), SpO2 97 %.  PHYSICAL EXAMINATION:  Physical Exam  GENERAL:  74 y.o.-year-old patient lying in the bed with no acute distress.  EYES: Pupils equal, round, reactive to light and accommodation. No scleral icterus. Extraocular muscles intact.  HEENT: Head atraumatic, normocephalic. Oropharynx and nasopharynx clear. No oropharyngeal erythema, moist oral mucosa  NECK:  Supple, no jugular venous distention. No thyroid enlargement, no tenderness.  LUNGS: Normal breath sounds bilaterally, no wheezing, rales, rhonchi. No use of accessory muscles of respiration.  CARDIOVASCULAR: S1, S2 normal. No murmurs, rubs, or gallops.  ABDOMEN: Soft, nontender, nondistended. Bowel sounds present. No organomegaly or mass.  EXTREMITIES: No pedal edema, cyanosis, or  clubbing. + 2 pedal & radial pulses b/l.   NEUROLOGIC: Patient has resting tremor. Motor strength 4 x 5 in upper and lower extremity's. PSYCHIATRIC: The patient is alert and awake. Flat affect. SKIN: Erythema in right lower extremity more than left lower extremity. Has a small blister on the medial aspect of right foot. No discharge noticed. Tender all over.  LABORATORY PANEL:   CBC No results for input(s): WBC, HGB, HCT, PLT in the last 168 hours. ------------------------------------------------------------------------------------------------------------------  Chemistries   Recent Labs Lab 11/23/14 1607  NA 139  K 4.5  CL 105  CO2 26  GLUCOSE 105*  BUN 32*  CREATININE 1.18  CALCIUM 9.4  AST 21  ALT 6*  ALKPHOS 76  BILITOT 1.2   ------------------------------------------------------------------------------------------------------------------  Cardiac Enzymes No results for input(s): TROPONINI in the last 168  hours. ------------------------------------------------------------------------------------------------------------------  RADIOLOGY:  US Venous Img Lower Unilateral Right  11/23/2014  CLINICAL DATA:  Right lower extremity swelling for the past 1-2 months. Evaluate for DVT. EXAM: RIGHT LOWER EXTREMITY VENOUS DOPPLER ULTRASOUND TECHNIQUE: Gray-scale sonography with graded compression, as well as color Doppler and duplex ultrasound were performed to evaluate the lower extremity deep venous systems from the level of the common femoral vein and including the common femoral, femoral, profunda femoral, popliteal and calf veins including the posterior tibial, peroneal and gastrocnemius veins when visible. The superficial great saphenous vein was also interrogated. Spectral Doppler was utilized to evaluate flow at rest and with distal augmentation maneuvers in the common femoral, femoral and popliteal veins. COMPARISON:  None. FINDINGS: Contralateral Common Femoral Vein: Respiratory phasicity is normal and symmetric with the symptomatic side. No evidence of thrombus. Normal compressibility. Common Femoral Vein: No evidence of thrombus. Normal compressibility, respiratory phasicity and response to augmentation. Saphenofemoral Junction: No evidence of thrombus. Normal compressibility and flow on color Doppler imaging. Profunda Femoral Vein: No evidence of thrombus. Normal compressibility and flow on color Doppler imaging. Femoral Vein: No evidence of thrombus. Normal compressibility, respiratory phasicity and response to augmentation. Popliteal Vein: No evidence of thrombus. Normal compressibility, respiratory phasicity and response to augmentation. Calf Veins: No evidence of thrombus. Normal compressibility and flow on color Doppler imaging. Superficial Great Saphenous Vein: No evidence of thrombus. Normal compressibility and flow on color Doppler imaging. Venous Reflux:  None. Other Findings:  None. IMPRESSION: No  evidence of DVT within the right lower extremity. Electronically Signed   By: Simonne Come M.D.   On: 11/23/2014 17:02     IMPRESSION AND PLAN:   * RLE cellultis with possible abscess Start IV clindamycin Consult podiatry Discharge as per inpatient improvement  * Urinary retention Patient likely has  BPH. No UTI on urinalysis. Start Flomax and voiding trial in 2 days. Follow-up with urology as outpatient. BUN and creatinine normal.  * Diarrhea Check C. difficile and stool cultures. Start enteric isolation. No recent antibiotic use. Normal WBC. Patient is on loperamide at home and this could be chronic.  * Hypertension We'll continue his home medications.  * Parkinson's disease is stable  * Chronic pain syndrome Continue fentanyl patch  * DVT prophylaxis with Lovenox   All the records are reviewed and case discussed with ED provider. Management plans discussed with the patient, family and they are in agreement.  CODE STATUS: FULL  TOTAL TIME TAKING CARE OF THIS PATIENT: 40 minutes.    Milagros Loll R M.D on 11/23/2014 at 8:34 PM  Between 7am to 6pm - Pager - [phone]  After 6pm go to [email or web address] -  password [email or web address] [facility]  [facility]  Office  [phone]  CC: Primary care physician; [name], MD     Note: This dictation was prepared with Dragon dictation along with smaller phrase technology. Any transcriptional errors that result from this process are unintentional.

## 2014-11-23 NOTE — ED Notes (Signed)
Boxed lunch and orange juice provided to patient.

## 2014-11-23 NOTE — Telephone Encounter (Signed)
If lightheaded, SOB, or sig dec in UOP, then to ER tonight.   If not urinating since yesterday- then I would have him go to ER.

## 2014-11-23 NOTE — Telephone Encounter (Signed)
Patient Name: Hunter PicketJERRY Tessier  DOB: 04/10/1940    Initial Comment Caller states she is his caretaker. Has not urinated since yesterday, very weak.    Nurse Assessment  Nurse: Scarlette ArStandifer, RN, Heather Date/Time (Eastern Time): 11/23/2014 2:09:09 PM  Confirm and document reason for call. If symptomatic, describe symptoms. ---Caller states that patient has had diarrhea for the last 3 days and he became weak yesterday, he is going numerous times.  Has the patient traveled out of the country within the last 30 days? ---Not Applicable  Does the patient have any new or worsening symptoms? ---Yes  Will a triage be completed? ---Yes  Related visit to physician within the last 2 weeks? ---No  Does the PT have any chronic conditions? (i.e. diabetes, asthma, etc.) ---Yes  List chronic conditions. ---see MR     Guidelines    Guideline Title Affirmed Question Affirmed Notes  Diarrhea [1] SEVERE diarrhea (e.g., 7 or more times / day more than normal) AND [2] age > 60 years    Final Disposition User   See Physician within 4 Hours (or PCP triage) Scarlette ArStandifer, RN, Herbert SetaHeather    Comments  Patient has urinated 3 times since yesterday.   Referrals  GO TO FACILITY UNDECIDED   Disagree/Comply: Comply

## 2014-11-23 NOTE — ED Notes (Signed)
Pt via [facility] with swollen and hot right foot. They were told by home health nurse that this was new and his MD said to bring him to ED for treatment. Per EMS, family states that the blister and swelling and redness of foot have been present since he came home from rehab in August. Pt states that he is here because of 8 + episodes of diarrhea today. Pt A&O x 4 with NAD noted.

## 2014-11-24 MED ORDER — AMOXICILLIN-POT CLAVULANATE 875-125 MG PO TABS
1.0000 | ORAL_TABLET | Freq: Two times a day (BID) | ORAL | Status: DC
  Administered 2014-11-24 – 2014-11-25 (×3): 1 via ORAL
  Filled 2014-11-24 (×4): qty 1

## 2014-11-24 MED ORDER — CARBIDOPA-LEVODOPA 25-100 MG PO TABS
1.5000 | ORAL_TABLET | Freq: Three times a day (TID) | ORAL | Status: DC
  Administered 2014-11-24 – 2014-11-25 (×3): 1.5 via ORAL
  Filled 2014-11-24 (×3): qty 2

## 2014-11-24 NOTE — Clinical Social Work Note (Signed)
Clinical Social Work Assessment  Patient Details  Name: Hunter Willis MRN: [id number] Date of Birth: 02/26/1940  Date of referral:  11/24/14               Reason for consult:  Facility Placement                Permission sought to share information with:  Oceanographeracility Contact Representative Permission granted to share information::  Yes, Verbal Permission Granted  Name::      Skilled Nursing Facility   Agency::   [place]   Relationship::     Contact Information:     Housing/Transportation Living arrangements for the past 2 months:  Single Family Home Source of Information:  Spouse Patient Interpreter Needed:  None Criminal Activity/Legal Involvement Pertinent to Current Situation/Hospitalization:  No - Comment as needed Significant Relationships:  Spouse Lives with:  Spouse Do you feel safe going back to the place where you live?  Yes Need for family participation in patient care:  Yes (Comment)  Care giving concerns: [place]    Social Worker assessment / plan: Visual merchandiserClinical Social Worker (CSW) received verbal SNF consult from PT. PT is recommending rehab. CSW attempted to meet with patient however he was asleep. CSW contacted patient's wife Hunter Willis to complete assessment. Per Hunter Willis patient lives at home with 4 hours of caregivers Monday through Saturday. Wife reported that patient fell and broke his hip and went to re[facility] from March 2016 to August 2016. Per wife they have no children however wife's 3 sisters are very supportive. Wife reported that patient does not get around much and takes several naps during the day. CSW explained that PT is recommending patient go to rehab again. Wife reported that she would like to think about SNF or home health. Per wife [facility] Wife reported that she prefers to take patient home. Per wife she is agreeable to SNF search and [facility]   FL2 complete and on  chart.   Employment status:  Disabled (Comment on whether or not currently receiving Disability), Retired Database administratornsurance information:  Managed Medicare PT Recommendations:  Skilled Nursing Facility Information / Referral to community resources:  Skilled Nursing Facility  Patient/Family's Response to care: Patient's wife is [facility]able to [facility] however prefers to go home.   Patient/Family's Understanding of and Emotional Response to Diagnosis, Current Treatment, and Prognosis: Wife was pleasant and thanked CSW for calling.   Emotional Assessment Appearance:    Attitude/Demeanor/Rapport:  Unable to Assess Affect (typically observed):  Unable to Assess Orientation:  Fluctuating Orientation (Suspected and/or reported Sundowners) Alcohol / Substance use:  Not Applicable Psych involvement (Current and /or in the community):  No (Comment)  Discharge Needs  Concerns to be addressed:  Discharge Planning Concerns Readmission within the last 30 days:  No Current discharge risk:  Dependent with Mobility Barriers to Discharge:  Continued Medical Work up   Haig ProphetMorgan, Maccoy Haubner G, LCSW 11/24/2014, 2:49 PM

## 2014-11-24 NOTE — Consult Note (Signed)
Reason for Consult: Cellulitis right leg with blister formation Referring Physician: Hospitalist  Hunter Willis is an 74 y.o. male.  HPI: Patient recently presented to the emergency department with significant bouts of diarrhea with feeling very rundown. Cellulitis was noted in the right foot with a blister along the side of the great toe joint. The patient denies any specific injury to the area. It has been sore for a while. Does not relate any specific drainage  Past Medical History  Diagnosis Date  . Allergy   . Hypertension   . GERD (gastroesophageal reflux disease)   . Hyperlipidemia   . Tremor   . BPH (benign prostatic hyperplasia)     followed by Dr. Yves Dill  . Renal stones   . Parkinson's disease ([place])   . Edema     Past Surgical History  Procedure Laterality Date  . Esophagogastroduodenoscopy  01/1998  . Cystoscopy tumor / condylomata w/ laser  01/26/2001  . Emg  06/23/2001    NCL LE'S wnl, Dr. Loletta Specter  . Lithotripsy  10/2003    kidney stone, Dr. Rogers Blocker  . Hip arthroplasty Left 05/31/2013    Procedure: LEFT HIP HEMIARTHROPLASTY;  Surgeon: Mauri Pole, MD;  Location: [facility];  Service: Orthopedics;  Laterality: Left;    Family History  Problem Relation Age of Onset  . Diabetes Mother   . Diabetes Sister   . Cancer Brother     prostate, terminal  . Prostate cancer Brother   . Hypertension Brother   . Hypertension Brother   . Hypertension Brother   . Depression Sister   . Cancer Sister     throat  . Heart disease Father   . Heart disease Brother   . Colon cancer Neg Hx     Social History:  reports that he has never smoked. He does not have any smokeless tobacco history on file. He reports that he does not drink alcohol. His drug history is not on file.  Allergies: No Known Allergies  Medications: I have reviewed the patient's current medications.  Results for orders placed or performed during the hospital encounter of 11/23/14 (from the past 48 hour(s))   CBC     Status: Abnormal   Collection Time: 11/23/14  4:07 PM  Result Value Ref Range   WBC 8.4 3.8 - 10.6 K/uL   RBC 3.81 (L) 4.40 - 5.90 MIL/uL   Hemoglobin 12.0 (L) 13.0 - 18.0 g/dL   HCT 36.0 (L) 40.0 - 52.0 %   MCV 94.4 80.0 - 100.0 fL   MCH 31.5 26.0 - 34.0 pg   MCHC 33.4 32.0 - 36.0 g/dL   RDW 13.6 11.5 - 14.5 %   Platelets 186 150 - 440 K/uL  Basic metabolic panel     Status: Abnormal   Collection Time: 11/23/14  4:07 PM  Result Value Ref Range   Sodium 139 135 - 145 mmol/L   Potassium 4.5 3.5 - 5.1 mmol/L    Comment: HEMOLYSIS AT THIS LEVEL MAY AFFECT RESULT   Chloride 105 101 - 111 mmol/L   CO2 26 22 - 32 mmol/L   Glucose, Bld 105 (H) 65 - 99 mg/dL   BUN 32 (H) 6 - 20 mg/dL   Creatinine, Ser 1.18 0.61 - 1.24 mg/dL   Calcium 9.4 8.9 - 10.3 mg/dL   GFR calc non Af Amer 58 (L) >60 mL/min   GFR calc Af Amer >60 >60 mL/min    Comment: (NOTE) The eGFR has been calculated  using the CKD EPI equation. This calculation has not been validated in all clinical situations. eGFR's persistently <60 mL/min signify possible Chronic Kidney Disease.    Anion gap 8 5 - 15  Hepatic function panel     Status: Abnormal   Collection Time: 11/23/14  4:07 PM  Result Value Ref Range   Total Protein 7.0 6.5 - 8.1 g/dL   Albumin 3.9 3.5 - 5.0 g/dL   AST 21 15 - 41 U/L   ALT 6 (L) 17 - 63 U/L   Alkaline Phosphatase 76 38 - 126 U/L   Total Bilirubin 1.2 0.3 - 1.2 mg/dL   Bilirubin, Direct 0.2 0.1 - 0.5 mg/dL   Indirect Bilirubin 1.0 (H) 0.3 - 0.9 mg/dL  Urinalysis complete, with microscopic ([facility] only)     Status: Abnormal   Collection Time: 11/23/14  4:07 PM  Result Value Ref Range   Color, Urine YELLOW (A) YELLOW   APPearance CLEAR (A) CLEAR   Glucose, UA NEGATIVE NEGATIVE mg/dL   Bilirubin Urine NEGATIVE NEGATIVE   Ketones, ur NEGATIVE NEGATIVE mg/dL   Specific Gravity, Urine 1.015 1.005 - 1.030   Hgb urine dipstick NEGATIVE NEGATIVE   pH 5.0 5.0 - 8.0   Protein, ur NEGATIVE  NEGATIVE mg/dL   Nitrite NEGATIVE NEGATIVE   Leukocytes, UA NEGATIVE NEGATIVE   RBC / HPF 0-5 0 - 5 RBC/hpf   WBC, UA 0-5 0 - 5 WBC/hpf   Bacteria, UA NONE SEEN NONE SEEN   Squamous Epithelial / LPF 0-5 (A) NONE SEEN   Mucous PRESENT     US Venous Img Lower Unilateral Right  11/23/2014  CLINICAL DATA:  Right lower extremity swelling for the past 1-2 months. Evaluate for DVT. EXAM: RIGHT LOWER EXTREMITY VENOUS DOPPLER ULTRASOUND TECHNIQUE: Gray-scale sonography with graded compression, as well as color Doppler and duplex ultrasound were performed to evaluate the lower extremity deep venous systems from the level of the common femoral vein and including the common femoral, femoral, profunda femoral, popliteal and calf veins including the posterior tibial, peroneal and gastrocnemius veins when visible. The superficial great saphenous vein was also interrogated. Spectral Doppler was utilized to evaluate flow at rest and with distal augmentation maneuvers in the common femoral, femoral and popliteal veins. COMPARISON:  None. FINDINGS: Contralateral Common Femoral Vein: Respiratory phasicity is normal and symmetric with the symptomatic side. No evidence of thrombus. Normal compressibility. Common Femoral Vein: No evidence of thrombus. Normal compressibility, respiratory phasicity and response to augmentation. Saphenofemoral Junction: No evidence of thrombus. Normal compressibility and flow on color Doppler imaging. Profunda Femoral Vein: No evidence of thrombus. Normal compressibility and flow on color Doppler imaging. Femoral Vein: No evidence of thrombus. Normal compressibility, respiratory phasicity and response to augmentation. Popliteal Vein: No evidence of thrombus. Normal compressibility, respiratory phasicity and response to augmentation. Calf Veins: No evidence of thrombus. Normal compressibility and flow on color Doppler imaging. Superficial Great Saphenous Vein: No evidence of thrombus. Normal  compressibility and flow on color Doppler imaging. Venous Reflux:  None. Other Findings:  None. IMPRESSION: No evidence of DVT within the right lower extremity. Electronically Signed   By: Sandi Mariscal M.D.   On: 11/23/2014 17:02    Review of Systems  Constitutional: Positive for malaise/fatigue.  HENT: Negative.   Eyes: Negative.   Respiratory: Negative.   Cardiovascular: Positive for leg swelling.  Gastrointestinal: Positive for diarrhea.  Genitourinary: Positive for dysuria.       Retention  Musculoskeletal: Negative.   Skin:  Swelling in the right leg with a rash on the forefoot  Neurological: Positive for tremors.  Endo/Heme/Allergies: Negative.   Psychiatric/Behavioral: Negative.    Blood pressure 130/52, pulse 62, temperature 97.3 F (36.3 C), temperature source Oral, resp. rate 18, height 6' 5"  (1.956 m), weight 89.041 kg (196 lb 4.8 oz), SpO2 95 %. Physical Exam  Cardiovascular:  DP and PT pulses are palpable. Capillary filling time intact to the digits.  Musculoskeletal:  Adequate range of motion. Some pain on palpation beneath the right great toe joint area. Muscle testing is deferred  Neurological:  Epicritic sensations are grossly intact  Skin:  The skin is warm dry and somewhat atrophic. Significant edema is noted in lower extremity on the right. A hemorrhagic hyperkeratotic area on the plantar aspect of the first metatarsal with some superficial skin slough and blister formation along the medial aspect. Upon debridement there is communication between the plantar ulcer and medial area. The ulceration is superficial and granular measuring approximately 1 cm x 5 mm with a regular borders with the larger area of skin slough being approximately 2 cm x 2.5 cm. No significant purulence.    Assessment/Plan: Assessment: Superficial ulceration with blister formation right great toe joint. Mild cellulitis with no significant sign of abscess  Plan: Excisional debridement of  the devitalized tissue at the ulceration was performed of the superficial tissues not extending into the subcutaneous tissue. This was performed sharply using a pair of tissue nippers and again was performed excisionally of the above mentioned measurements in the physical exam. A saline wet-to-dry dressing was applied. Would recommend oral antibiotics and local wound care with antibiotic ointment and a bandage. We can follow the patient up on an outpatient basis as needed  Hunter Vasconcelos W. 11/24/2014, 1:22 PM

## 2014-11-24 NOTE — Progress Notes (Signed)
Patient arrived to the unit from ED 10/19  2300. Patient alert x3. Skin dry,blister noted on R foot,and abrasions noted to legs and buttock. Foam dressing applied. New IV placed.IV fluids infusing. Left side weakness present per patient history of stroke. Contact precautions maintained.  Foley intact. Patient denies pain.

## 2014-11-24 NOTE — Care Management Note (Signed)
Case Management Note  Patient Details  Name: Hunter Willis MRN: [id number] Date of Birth: 1940/07/22  Subjective/Objective:  Met with hired caregiver, Cecille Rubin to discuss discharge planning.  Patient sleeping. Patient lives at home with his wife. They have a hired caregiver that comes 4 days a week to assist patient and his wife with basic needs. She states patient and his wife are fairly independent and have a lot of family support that helps out. At baseline, patient is able to ambulate with a walker. He is alert and oriented.  Admitted with cellulitis that developed approximately 4 days ago. He is active with [facility] for home health nursing and PT. Notified Corliss Blacker with [facility] of admission. Patient has all needed home DME.                 Action/Plan: Provided caregiver with contact card. Will follow progression.   Expected Discharge Date:                  Expected Discharge Plan:  [place]  In-House Referral:     Discharge planning Services  CM Consult  Post Acute Care Choice:    Choice offered to:     DME Arranged:    DME Agency:     HH Arranged:    HH Agency:  [place]  Status of Service:  In process, will continue to follow  Medicare Important Message Given:    Date Medicare IM Given:    Medicare IM give by:    Date Additional Medicare IM Given:    Additional Medicare Important Message give by:     If discussed at [place] of Stay Meetings, dates discussed:    Additional Comments:  Jolly Mango, RN 11/24/2014, 9:43 AM

## 2014-11-24 NOTE — Progress Notes (Signed)
Dr Allena KatzPatel and Dr Jamey Reaslein here examining pt orders received  Dr Jamey Reasclein debrided rt great toe superfical pressure ulcer pt had prior to admission applied wet to dry dressing

## 2014-11-24 NOTE — Clinical Social Work Placement (Signed)
   CLINICAL SOCIAL WORK PLACEMENT  NOTE  Date:  11/24/2014  Patient Details  Name: Lyn RecordsJerry D Bo MRN: [id number] Date of Birth: 02/23/1940  Clinical Social Work is seeking post-discharge placement for this patient at the Skilled  Nursing Facility level of care (*CSW will initial, date and re-position this form in  chart as items are completed):  Yes   Patient/family provided with [facility] Clinical Social Work Department's list of facilities offering this level of care within the geographic area requested by the patient (or if unable, by the patient's family).  Yes   Patient/family informed of their freedom to choose among providers that offer the needed level of care, that participate in Medicare, Medicaid or managed care program needed by the patient, have an available bed and are willing to accept the patient.  Yes   Patient/family informed of [facility]'s ownership interest in [facility] and [facility], as well as of the fact that they are under no obligation to receive care at these facilities.  PASRR submitted to EDS on       PASRR number received on       Existing PASRR number confirmed on 11/24/14     FL2 transmitted to all facilities in geographic area requested by pt/family on 11/24/14     FL2 transmitted to all facilities within larger geographic area on       Patient informed that his/her managed care company has contracts with or will negotiate with certain facilities, including the following:            Patient/family informed of bed offers received.  Patient chooses bed at       Physician recommends and patient chooses bed at      Patient to be transferred to   on  .  Patient to be transferred to facility by       Patient family notified on   of transfer.  Name of family member notified:        PHYSICIAN Please sign FL2     Additional Comment:    _______________________________________________ Haig ProphetMorgan, Latarsha Zani G, LCSW 11/24/2014, 2:48  PM

## 2014-11-24 NOTE — Progress Notes (Addendum)
[facility] -  at [facility]   PATIENT NAME: Hunter PicketJerry Willis    MR#:  [id number]  DATE OF BIRTH:  02/23/1940  SUBJECTIVE:   Denies any complaints. Tremors+ + REVIEW OF SYSTEMS:   Review of Systems  Constitutional: Negative for fever, chills and weight loss.  HENT: Negative for ear discharge, ear pain and nosebleeds.   Eyes: Negative for blurred vision, pain and discharge.  Respiratory: Negative for sputum production, shortness of breath, wheezing and stridor.   Cardiovascular: Negative for chest pain, palpitations, orthopnea and PND.  Gastrointestinal: Negative for nausea, vomiting, abdominal pain and diarrhea.  Genitourinary: Negative for urgency and frequency.  Musculoskeletal: Negative for back pain and joint pain.  Neurological: Positive for tremors and weakness. Negative for sensory change, speech change and focal weakness.  Psychiatric/Behavioral: Negative for depression and hallucinations. The patient is not nervous/anxious.   All other systems reviewed and are negative.   Tolerating PT:rec rehab Tolerating diet: yes  DRUG ALLERGIES:  No Known Allergies  VITALS:  Blood pressure 130/52, pulse 62, temperature 97.3 F (36.3 C), temperature source Oral, resp. rate 18, height 6\' 5"  (1.956 m), weight 89.041 kg (196 lb 4.8 oz), SpO2 95 %.  PHYSICAL EXAMINATION:  GENERAL:  74 y.o.-year-old patient lying in the bed with no acute distress.  EYES: Pupils equal, round, reactive to light and accommodation. No scleral icterus. Extraocular muscles intact.  HEENT: Head atraumatic, normocephalic. Oropharynx and nasopharynx clear.  NECK:  Supple, no jugular venous distention. No thyroid enlargement, no tenderness.  LUNGS: Normal breath sounds bilaterally, no wheezing, rales,rhonchi or crepitation. No use of accessory muscles of respiration.  CARDIOVASCULAR: S1, S2 normal. No murmurs, rubs, or gallops.  ABDOMEN: Soft, nontender, nondistended. Bowel sounds  present. No organomegaly or mass.  EXTREMITIES: ++ pedal edema, cyanosis, or clubbing.  NEUROLOGIC: Cranial nerves II through XII are intact. Muscle strength 5/5 in all extremities. Sensation intact. Gait not checked. Tremors+ PSYCHIATRIC:  patient is alert and oriented x 2.  SKIN: No obvious rash, lesion, or ulcer.    LABORATORY PANEL:   CBC  Recent Labs Lab 11/23/14 1607  WBC 8.4  HGB 12.0*  HCT 36.0*  PLT 186   ------------------------------------------------------------------------------------------------------------------  Chemistries   Recent Labs Lab 11/23/14 1607  NA 139  K 4.5  CL 105  CO2 26  GLUCOSE 105*  BUN 32*  CREATININE 1.18  CALCIUM 9.4  AST 21  ALT 6*  ALKPHOS 76  BILITOT 1.2   ------------------------------------------------------------------------------------------------------------------  Cardiac Enzymes No results for input(s): TROPONINI in the last 168 hours. ------------------------------------------------------------------------------------------------------------------  RADIOLOGY:  [place] Venous Img Lower Unilateral Right  11/23/2014  CLINICAL DATA:  Right lower extremity swelling for the past 1-2 months. Evaluate for DVT. EXAM: RIGHT LOWER EXTREMITY VENOUS DOPPLER ULTRASOUND TECHNIQUE: Gray-scale sonography with graded compression, as well as color Doppler and duplex ultrasound were performed to evaluate the lower extremity deep venous systems from the level of the common femoral vein and including the common femoral, femoral, profunda femoral, popliteal and calf veins including the posterior tibial, peroneal and gastrocnemius veins when visible. The superficial great saphenous vein was also interrogated. Spectral Doppler was utilized to evaluate flow at rest and with distal augmentation maneuvers in the common femoral, femoral and popliteal veins. COMPARISON:  None. FINDINGS: Contralateral Common Femoral Vein: Respiratory phasicity is normal and  symmetric with the symptomatic side. No evidence of thrombus. Normal compressibility. Common Femoral Vein: No evidence of thrombus. Normal compressibility, respiratory phasicity and response to augmentation. Saphenofemoral  Junction: No evidence of thrombus. Normal compressibility and flow on color Doppler imaging. Profunda Femoral Vein: No evidence of thrombus. Normal compressibility and flow on color Doppler imaging. Femoral Vein: No evidence of thrombus. Normal compressibility, respiratory phasicity and response to augmentation. Popliteal Vein: No evidence of thrombus. Normal compressibility, respiratory phasicity and response to augmentation. Calf Veins: No evidence of thrombus. Normal compressibility and flow on color Doppler imaging. Superficial Great Saphenous Vein: No evidence of thrombus. Normal compressibility and flow on color Doppler imaging. Venous Reflux:  None. Other Findings:  None. IMPRESSION: No evidence of DVT within the right lower extremity. Electronically Signed   By: Simonne Come M.D.   On: 11/23/2014 17:02   ASSESSMENT AND PLAN:   * RLE cellultis mild Was on  IV clindamycin--->change to po Augmentin  podiatry consult appreciated with Dr cline. debdrided the hard callous at bed side  * Urinary retention Patient likely has BPH. No UTI on urinalysis. Started Flomax and voiding trial in 2 days. Follow-up with urology as outpatient. BUN and creatinine normal.  * Diarrhea-resolved. No BM since y'day Unable to Check C. difficile and stool cultures. No recent antibiotic use. Normal WBC. Patient is on loperamide at home and this could be chronic. -d/c isolation  * Hypertension We'll continue his home medications.  * Parkinson's disease is stable  * Chronic pain syndrome Continue fentanyl patch  * DVT prophylaxis with Lovenox  D/w wife Ms Joyce Gross Pt will d/c home in am   All the records are reviewed and case discussed with Care Management/Social Workerr. Management plans  discussed with the patient, family and they are in agreement.  CODE STATUS: full  TOTAL TIME TAKING CARE OF THIS PATIENT: 35 minutes.   POSSIBLE D/C IN 1-2 DAYS, DEPENDING ON CLINICAL CONDITION.   Alyjah Lovingood M.D on 11/24/2014 at 2:04 PM  Between 7am to 6pm - Pager - [phone]  After 6pm go to [email or web address] - password [email or web address] [facility]  [facility]  Office  [phone]  CC: Primary care physician; [name], MD

## 2014-11-24 NOTE — Evaluation (Signed)
Physical Therapy Evaluation Patient Details Name: Hunter Willis MRN: [id number] DOB: 12/02/1940 Today's Date: 11/24/2014   History of Present Illness  74 yo male with onset of weakness from RLE cellulitis complicated by Parkinson's.  Home with caregiver but very debilitated  Clinical Impression  Pt is getting up to sit bedside but cannot stand more than one time without being more dependent and having trouble tolerating the activity.  Will need to get up to standing and walking more reliably to go home given that his caregivers are not physically going to be able to lift him.  Case managers are aware and checking on his options.    Follow Up Recommendations SNF    Equipment Recommendations  None recommended by PT    Recommendations for Other Services Rehab consult     Precautions / Restrictions Precautions Precautions: Fall Restrictions Weight Bearing Restrictions: No      Mobility  Bed Mobility Overal bed mobility: Needs Assistance Bed Mobility: Supine to Sit;Sit to Supine     Supine to sit: Max assist Sit to supine: Max assist   General bed mobility comments: pt does not use railing and waits for PT to move his legs, lift his trunk.  More interactive to get back to bed  Transfers Overall transfer level: Needs assistance Equipment used: Rolling walker (2 wheeled);1 person hand held assist Transfers: Sit to/from Stand Sit to Stand: Max assist;Total assist         General transfer comment: uses both hands on walker first and needs corrections every trial  Ambulation/Gait             General Gait Details: unable  Stairs            Wheelchair Mobility    Modified Rankin (Stroke Patients Only)       Balance Overall balance assessment: Needs assistance Sitting-balance support: Feet supported;Bilateral upper extremity supported Sitting balance-Leahy Scale: Poor Sitting balance - Comments: poor+ to fair- Postural control: Posterior lean Standing  balance support: Bilateral upper extremity supported Standing balance-Leahy Scale: Poor                               Pertinent Vitals/Pain Pain Assessment: Faces Faces Pain Scale: Hurts little more Pain Location: back Pain Descriptors / Indicators: Aching Pain Intervention(s): Repositioned (stretched back with movement)    Home Living Family/patient expects to be discharged to:: Private residence Living Arrangements: Spouse/significant other Available Help at Discharge: Family;Personal care attendant;Available 24 hours/day Type of Home: House Home Access: Ramped entrance     Home Layout: One level Home Equipment: Walker - 2 wheels;Walker - 4 wheels;Cane - single point;Shower seat;Bedside commode Additional Comments: caregiver is with pt in room    Prior Function Level of Independence: Needs assistance   Gait / Transfers Assistance Needed: mod I with RW  ADL's / Homemaking Assistance Needed: family and caregiver complete        Hand Dominance        Extremity/Trunk Assessment   Upper Extremity Assessment: Generalized weakness           Lower Extremity Assessment: Generalized weakness      Cervical / Trunk Assessment: Kyphotic  Communication   Communication: Expressive difficulties;Other (comment) (speech is low volume)  Cognition Arousal/Alertness: Lethargic Behavior During Therapy: Flat affect Overall Cognitive Status: Impaired/Different from baseline Area of Impairment: Following commands;Safety/judgement;Awareness;Problem solving     Memory: Decreased recall of precautions;Decreased short-term memory Following Commands: Follows  one step commands inconsistently Safety/Judgement: Decreased awareness of safety;Decreased awareness of deficits Awareness: Intellectual Problem Solving: Slow processing;Requires verbal cues;Difficulty sequencing General Comments: reminders for hand placment    General Comments General comments (skin integrity,  edema, etc.): Pt has been home with help but will need some inpt therapy before being that independent again.  Too dependent for his older wife and sitter to care for her.    Exercises        Assessment/Plan    PT Assessment Patient needs continued PT services  PT Diagnosis Generalized weakness   PT Problem List Decreased strength;Decreased range of motion;Decreased activity tolerance;Decreased balance;Decreased mobility;Decreased coordination;Decreased cognition;Decreased knowledge of use of DME;Decreased safety awareness;Impaired tone (oscillating tone in UE's with intention)  PT Treatment Interventions DME instruction;Gait training;Functional mobility training;Therapeutic activities;Therapeutic exercise;Balance training;Neuromuscular re-education;Cognitive remediation;Patient/family education   PT Goals (Current goals can be found in the Care Plan section) Acute Rehab PT Goals Patient Stated Goal: to get up to walk PT Goal Formulation: With patient Time For Goal Achievement: 12/08/14 Potential to Achieve Goals: Good    Frequency Min 2X/week   Barriers to discharge Other (comment) (2 person assist to attempt gait)      Co-evaluation               End of Session Equipment Utilized During Treatment: Gait belt Activity Tolerance: Patient tolerated treatment well;Patient limited by fatigue;Patient limited by lethargy Patient left: in bed;with call bell/phone within reach;with bed alarm set;with nursing/sitter in room Nurse Communication: Mobility status         Time: [id number] PT Time Calculation (min) (ACUTE ONLY): 29 min   Charges:   PT Evaluation $Initial PT Evaluation Tier I: 1 Procedure PT Treatments $Therapeutic Activity: 8-22 mins   PT G Codes:        Ivar DrapeStout, Armya Westerhoff E 11/24/2014, 12:56 PM   Samul Dadauth Ibraham Levi, PT MS Acute Rehab Dept. Number: [facility] [id number] and [facility] [phone]

## 2014-11-25 DIAGNOSIS — L8990 Pressure ulcer of unspecified site, unspecified stage: Secondary | ICD-10-CM | POA: Insufficient documentation

## 2014-11-25 MED ORDER — AMOXICILLIN-POT CLAVULANATE 875-125 MG PO TABS: 1.0000 | ORAL_TABLET | Freq: Two times a day (BID) | ORAL | Status: DC

## 2014-11-25 MED ORDER — SALINE SPRAY 0.65 % NA SOLN
1.0000 | Freq: Once | NASAL | Status: AC
  Administered 2014-11-25: 1 via NASAL
  Filled 2014-11-25: qty 44

## 2014-11-25 NOTE — Discharge Instructions (Signed)
Dressing changes per instructions Resume HH services

## 2014-11-25 NOTE — Discharge Summary (Signed)
[facility] [place] [facility]   PATIENT NAME: Hunter Willis    MR#:  [id number]  DATE OF BIRTH:  [birth year]  DATE OF ADMISSION:  11/23/2014 ADMITTING PHYSICIAN: Milagros Loll, MD  DATE OF DISCHARGE:11/25/14  PRIMARY CARE PHYSICIAN: [name], MD    ADMISSION DIAGNOSIS:  Urinary retention [R33.9] Cellulitis of right lower extremity [L03.115] Diarrhea, unspecified type [R19.7]  DISCHARGE DIAGNOSIS:  Superficial ulceration of  Right great toe s/p excisional debridement Urinary incontinence Severe Parkinson's disease SECONDARY DIAGNOSIS:   Past Medical History  Diagnosis Date  . Allergy   . Hypertension   . GERD (gastroesophageal reflux disease)   . Hyperlipidemia   . Tremor   . BPH (benign prostatic hyperplasia)     followed by Dr. Evelene Croon  . Renal stones   . Parkinson's disease (HCC)   . Edema     HOSPITAL COURSE:  * RLE cellultis mild with right great toe superficial blister with excisional debridement by dr Alberteen Spindle Was on IV clindamycin--->change to po Augmentin podiatry consult appreciated with Dr cline.   * Urinary retention Patient likely has BPH. No UTI on urinalysis. BUN and creatinine normal.  * Diarrhea-resolved. No BM since y'day Unable to Check C. difficile and stool cultures. No recent antibiotic use. Normal WBC. Patient is on loperamide at home and this could be chronic. -d/ced isolation  * Hypertension continue his home medications.  * Parkinson's disease is stable  * Chronic pain syndrome Continue fentanyl patch  * DVT prophylaxis with Lovenox  Overall improved. D/c home with HH vs Rehab (wife to decide) CONSULTS OBTAINED:  Treatment Team:  Linus Galas, MD  DRUG ALLERGIES:  No Known Allergies  DISCHARGE MEDICATIONS:   Current Discharge Medication List    START taking these medications   Details  amoxicillin-clavulanate (AUGMENTIN) 875-125 MG tablet Take 1 tablet by mouth every 12 (twelve)  hours. Qty: 12 tablet, Refills: 0      CONTINUE these medications which have NOT CHANGED   Details  atenolol (TENORMIN) 25 MG tablet Take 1 tablet (25 mg total) by mouth 2 (two) times daily. Qty: 180 tablet, Refills: 3    benztropine (COGENTIN) 0.5 MG tablet Take 2 tablets (1 mg total) by mouth 2 (two) times daily.    Carbidopa 25 MG tablet Take 25 mg by mouth 3 (three) times daily.    carbidopa-levodopa (SINEMET IR) 25-100 MG per tablet Take 0.5 tablets by mouth 3 (three) times daily.    donepezil (ARICEPT) 5 MG tablet Take 5 mg by mouth at bedtime.    fentaNYL (DURAGESIC - DOSED MCG/HR) 12 MCG/HR Place 1 patch (12.5 mcg total) onto the skin every 3 (three) days. Qty: 10 patch, Refills: 0    furosemide (LASIX) 20 MG tablet Take 20 mg by mouth daily.    glycopyrrolate (ROBINUL) 1 MG tablet Take 1 mg by mouth 2 (two) times daily.    ibuprofen (ADVIL,MOTRIN) 200 MG tablet Take 400 mg by mouth every 6 (six) hours as needed for headache or mild pain.    loperamide (IMODIUM A-D) 2 MG tablet Take 2 mg by mouth 4 (four) times daily as needed for diarrhea or loose stools.    Melatonin 3 MG TABS Take 3 mg by mouth at bedtime.    omeprazole (PRILOSEC) 20 MG capsule Take 1 capsule (20 mg total) by mouth daily. Qty: 30 capsule, Refills: 5    OVER THE COUNTER MEDICATION Take 2 tablets by mouth at bedtime. Pt takes Hyland's--Restful Legs.  potassium chloride (K-DUR) 10 MEQ tablet Take 1 tablet (10 mEq total) by mouth daily. Qty: 30 tablet, Refills: 5    pramipexole (MIRAPEX) 0.5 MG tablet Take 0.5 mg by mouth at bedtime.    QUEtiapine (SEROQUEL) 50 MG tablet Take 50 mg by mouth at bedtime.    Rotigotine (NEUPRO) 8 MG/24HR PT24 Place 1 patch onto the skin daily.         If you experience worsening of your admission symptoms, develop shortness of breath, life threatening emergency, suicidal or homicidal thoughts you must seek medical attention immediately by calling 911 or calling  your MD immediately  if symptoms less severe.  You Must read complete instructions/literature along with all the possible adverse reactions/side effects for all the Medicines you take and that have been prescribed to you. Take any new Medicines after you have completely understood and accept all the possible adverse reactions/side effects.   Please note  You were cared for by a hospitalist during your hospital stay. If you have any questions about your discharge medications or the care you received while you were in the hospital after you are discharged, you can call the unit and asked to speak with the hospitalist on call if the hospitalist that took care of you is not available. Once you are discharged, your primary care physician will handle any further medical issues. Please note that NO REFILLS for any discharge medications will be authorized once you are discharged, as it is imperative that you return to your primary care physician (or establish a relationship with a primary care physician if you do not have one) for your aftercare needs so that they can reassess your need for medications and monitor your lab values. Today   SUBJECTIVE  Doing well   VITAL SIGNS:  Blood pressure 157/77, pulse 58, temperature 98.1 F (36.7 C), temperature source Oral, resp. rate 16, height 6\' 5"  (1.956 m), weight 89.12 kg (196 lb 7.6 oz), SpO2 97 %.  I/O:   Intake/Output Summary (Last 24 hours) at 11/25/14 0813 Last data filed at 11/25/14 0400  Gross per 24 hour  Intake   1435 ml  Output    725 ml  Net    710 ml    PHYSICAL EXAMINATION:  GENERAL:  74 y.o.-year-old patient lying in the bed with no acute distress.  EYES: Pupils equal, round, reactive to light and accommodation. No scleral icterus. Extraocular muscles intact.  HEENT: Head atraumatic, normocephalic. Oropharynx and nasopharynx clear.  NECK:  Supple, no jugular venous distention. No thyroid enlargement, no tenderness.  LUNGS: Normal  breath sounds bilaterally, no wheezing, rales,rhonchi or crepitation. No use of accessory muscles of respiration.  CARDIOVASCULAR: S1, S2 normal. No murmurs, rubs, or gallops.  ABDOMEN: Soft, non-tender, non-distended. Bowel sounds present. No organomegaly or mass.  EXTREMITIES:++ pedal edema, cyanosis, or clubbing.  NEUROLOGIC: Cranial nerves II through XII are intact. Muscle strength 5/5 in all extremities. Sensation intact. Gait not checked. parkinsons rest tremors+ PSYCHIATRIC: The patient is alert and oriented x 2.  SKIN: right great toe superficial ulceration no d/c. Dressing+  DATA REVIEW:   CBC   Recent Labs Lab 11/23/14 1607  WBC 8.4  HGB 12.0*  HCT 36.0*  PLT 186    Chemistries   Recent Labs Lab 11/23/14 1607  NA 139  K 4.5  CL 105  CO2 26  GLUCOSE 105*  BUN 32*  CREATININE 1.18  CALCIUM 9.4  AST 21  ALT 6*  ALKPHOS 76  BILITOT  1.2    Microbiology Results   No results found for this or any previous visit (from the past 240 hour(s)).  RADIOLOGY:  US Venous Img Lower Unilateral Right  11/23/2014  CLINICAL DATA:  Right lower extremity swelling for the past 1-2 months. Evaluate for DVT. EXAM: RIGHT LOWER EXTREMITY VENOUS DOPPLER ULTRASOUND TECHNIQUE: Gray-scale sonography with graded compression, as well as color Doppler and duplex ultrasound were performed to evaluate the lower extremity deep venous systems from the level of the common femoral vein and including the common femoral, femoral, profunda femoral, popliteal and calf veins including the posterior tibial, peroneal and gastrocnemius veins when visible. The superficial great saphenous vein was also interrogated. Spectral Doppler was utilized to evaluate flow at rest and with distal augmentation maneuvers in the common femoral, femoral and popliteal veins. COMPARISON:  None. FINDINGS: Contralateral Common Femoral Vein: Respiratory phasicity is normal and symmetric with the symptomatic side. No evidence of  thrombus. Normal compressibility. Common Femoral Vein: No evidence of thrombus. Normal compressibility, respiratory phasicity and response to augmentation. Saphenofemoral Junction: No evidence of thrombus. Normal compressibility and flow on color Doppler imaging. Profunda Femoral Vein: No evidence of thrombus. Normal compressibility and flow on color Doppler imaging. Femoral Vein: No evidence of thrombus. Normal compressibility, respiratory phasicity and response to augmentation. Popliteal Vein: No evidence of thrombus. Normal compressibility, respiratory phasicity and response to augmentation. Calf Veins: No evidence of thrombus. Normal compressibility and flow on color Doppler imaging. Superficial Great Saphenous Vein: No evidence of thrombus. Normal compressibility and flow on color Doppler imaging. Venous Reflux:  None. Other Findings:  None. IMPRESSION: No evidence of DVT within the right lower extremity. Electronically Signed   By: Simonne Come M.D.   On: 11/23/2014 17:02     Management plans discussed with the patient, family and they are in agreement.  CODE STATUS:     Code Status Orders        Start     Ordered   11/23/14 2032  Full code   Continuous     11/23/14 2032      TOTAL TIME TAKING CARE OF THIS PATIENT: 40 minutes.    Jameriah Trotti M.D on 11/25/2014 at 8:13 AM  Between 7am to 6pm - Pager - [phone] After 6pm go to [email or web address] - password [email or web address]  [facility]  Office  [phone]  CC: Primary care physician; [name], MD

## 2014-11-25 NOTE — Progress Notes (Signed)
Patient discharging home with family today. Caregiver OTW, will provide transportation home. D/C summary and prescriptions reviewed with pt., [facility]. Plan to transport by car, will assist as needed.

## 2014-11-25 NOTE — Care Management (Addendum)
Patient discharging to home today. Family refused SNF. I have notified MD and [facility]. EMS packet completed; RN notified. Spoke with patient's wife's and she wants to arrange something with the caregiver.. No further RNCM needs. Case closed.

## 2014-11-25 NOTE — Care Management Important Message (Signed)
Important Message  Patient Details  Name: Hunter Willis MRN: [id number] Date of Birth: 08/21/1940   Medicare Important Message Given:  Yes-second notification given    Olegario MessierKathy A Allmond 11/25/2014, 10:17 AM

## 2014-11-28 ENCOUNTER — Telehealth: Payer: Self-pay | Admitting: Family Medicine

## 2014-11-28 ENCOUNTER — Telehealth: Payer: Self-pay | Admitting: *Deleted

## 2014-11-28 NOTE — Telephone Encounter (Signed)
Cala BradfordKimberly with [facility] called back - please call [phone] Thank you

## 2014-11-28 NOTE — Telephone Encounter (Signed)
Transition Care Management Follow-up Telephone Call   Date discharged? 11/25/14   How have you been since you were released from the hospital? Improving, strength is returning   Do you understand why you were in the hospital? yes   Do you understand the discharge instructions? yes   Where were you discharged to? Home   Items Reviewed:  Medications reviewed: yes  Allergies reviewed: yes  Dietary changes reviewed: no  Referrals reviewed: no   Functional Questionnaire:   Activities of Daily Living (ADLs):   He states they are independent in the following: ambulation States they require assistance with the following: bathing and hygiene, feeding, continence, grooming, toileting and dressing   Any transportation issues/concerns?: no   Any patient concerns? yes, need for medication changes, will discuss at f/u   Confirmed importance and date/time of follow-up visits scheduled yes, 12/01/14 @ 1215  Provider Appointment booked with Raechel AcheShaw Duncan, MD  Confirmed with patient if condition begins to worsen call PCP or go to the ER.  Patient was given the office number and encouraged to call back with question or concerns.  : yes

## 2014-11-28 NOTE — Telephone Encounter (Signed)
Left message on voicemail Hunter Willis[facility] for Hunter BradfordKimberly to call back.

## 2014-11-28 NOTE — Telephone Encounter (Signed)
Please.  Thanks.   

## 2014-11-28 NOTE — Telephone Encounter (Signed)
Verbal order given to [facility] by telephone as instructed.

## 2014-11-28 NOTE — Telephone Encounter (Signed)
Tamilla (sic) [facility] called and would like verbal orders to continue with Pt's home therapy 2 X week for 6 weeks following his release from the hospital on 11/25/14.  Best number to call is [phone]

## 2014-12-01 ENCOUNTER — Other Ambulatory Visit: Payer: Self-pay

## 2014-12-01 ENCOUNTER — Encounter: Payer: Self-pay | Admitting: Family Medicine

## 2014-12-01 ENCOUNTER — Ambulatory Visit (INDEPENDENT_AMBULATORY_CARE_PROVIDER_SITE_OTHER): Payer: Medicare Other | Admitting: Family Medicine

## 2014-12-01 VITALS — BP 124/76 | HR 57 | Temp 97.9°F

## 2014-12-01 DIAGNOSIS — G20A1 Parkinson's disease without dyskinesia, without mention of fluctuations: Secondary | ICD-10-CM

## 2014-12-01 DIAGNOSIS — L8990 Pressure ulcer of unspecified site, unspecified stage: Secondary | ICD-10-CM

## 2014-12-01 DIAGNOSIS — Z23 Encounter for immunization: Secondary | ICD-10-CM

## 2014-12-01 DIAGNOSIS — G20 Parkinson's disease: Secondary | ICD-10-CM

## 2014-12-01 DIAGNOSIS — R399 Unspecified symptoms and signs involving the genitourinary system: Secondary | ICD-10-CM | POA: Diagnosis not present

## 2014-12-01 MED ORDER — CARBIDOPA-LEVODOPA 25-100 MG PO TABS: 1.5000 | ORAL_TABLET | Freq: Three times a day (TID) | ORAL | Status: DC

## 2014-12-01 MED ORDER — TAMSULOSIN HCL 0.4 MG PO CAPS: 0.4000 mg | ORAL_CAPSULE | Freq: Every day | ORAL | Status: DC

## 2014-12-01 NOTE — Assessment & Plan Note (Signed)
Foot, now s/p debridement and abx.  Doesn't appear infected.  Continue local care and update me as needed.  Appears to be well on the way to healing and at this point continued abx likely not helpful and more likely to cause ADE ie diarrhea.  Noted that diarrhea much improved now.

## 2014-12-01 NOTE — Assessment & Plan Note (Signed)
Start flomax with routine cautions.  He agrees.  See AVS.  Update me as needed.

## 2014-12-01 NOTE — Patient Instructions (Signed)
I sent the flomax to the pharmacy.   It might make you lightheaded but is should help with urination.  If you absolutely cannot urinate, then go to the ER.  Please update me in a few days after you have started flomax.  Take care.  Glad to see you.

## 2014-12-01 NOTE — Progress Notes (Signed)
The following was reviewed with the patient:  DATE OF ADMISSION: 10/19/2016ADMITTING PHYSICIAN: Milagros Loll, MD  DATE OF DISCHARGE:11/25/14  PRIMARY CARE PHYSICIAN: [name], MD    ADMISSION DIAGNOSIS:  Urinary retention [R33.9] Cellulitis of right lower extremity [L03.115] Diarrhea, unspecified type [R19.7]  DISCHARGE DIAGNOSIS:  Superficial ulceration of Right great toe s/p excisional debridement Urinary incontinence Severe Parkinson's disease SECONDARY DIAGNOSIS:   Past Medical History  Diagnosis Date  . Allergy   . Hypertension   . GERD (gastroesophageal reflux disease)   . Hyperlipidemia   . Tremor   . BPH (benign prostatic hyperplasia)     followed by Dr. Evelene Croon  . Renal stones   . Parkinson's disease (HCC)   . Edema     HOSPITAL COURSE:  * RLE cellultis mild with right great toe superficial blister with excisional debridement by dr Alberteen Spindle Was on IV clindamycin--->change to po Augmentin podiatry consult appreciated with Dr cline.   * Urinary retention Patient likely has BPH. No UTI on urinalysis. BUN and creatinine normal.  * Diarrhea-resolved. No BM since y'day Unable to Check C. difficile and stool cultures. No recent antibiotic use. Normal WBC. Patient is on loperamide at home and this could be chronic. -d/ced isolation  * Hypertension continue his home medications.  * Parkinson's disease is stable  * Chronic pain syndrome Continue fentanyl patch  * DVT prophylaxis with Lovenox  Overall improved. D/c home with HH vs Rehab (wife to decide) CONSULTS OBTAINED:  Treatment Team: Linus Galas, MD  DRUG ALLERGIES:  No Known Allergies  DISCHARGE MEDICATIONS:   Current Discharge Medication List    START taking these medications   Details  amoxicillin-clavulanate (AUGMENTIN) 875-125 MG tablet Take 1 tablet by mouth every 12 (twelve) hours. Qty: 12 tablet, Refills: 0       CONTINUE these medications which have NOT CHANGED   Details  atenolol (TENORMIN) 25 MG tablet Take 1 tablet (25 mg total) by mouth 2 (two) times daily. Qty: 180 tablet, Refills: 3    benztropine (COGENTIN) 0.5 MG tablet Take 2 tablets (1 mg total) by mouth 2 (two) times daily.    Carbidopa 25 MG tablet Take 25 mg by mouth 3 (three) times daily.    carbidopa-levodopa (SINEMET IR) 25-100 MG per tablet Take 0.5 tablets by mouth 3 (three) times daily.    donepezil (ARICEPT) 5 MG tablet Take 5 mg by mouth at bedtime.    fentaNYL (DURAGESIC - DOSED MCG/HR) 12 MCG/HR Place 1 patch (12.5 mcg total) onto the skin every 3 (three) days. Qty: 10 patch, Refills: 0    furosemide (LASIX) 20 MG tablet Take 20 mg by mouth daily.    glycopyrrolate (ROBINUL) 1 MG tablet Take 1 mg by mouth 2 (two) times daily.    ibuprofen (ADVIL,MOTRIN) 200 MG tablet Take 400 mg by mouth every 6 (six) hours as needed for headache or mild pain.    loperamide (IMODIUM A-D) 2 MG tablet Take 2 mg by mouth 4 (four) times daily as needed for diarrhea or loose stools.    Melatonin 3 MG TABS Take 3 mg by mouth at bedtime.    omeprazole (PRILOSEC) 20 MG capsule Take 1 capsule (20 mg total) by mouth daily. Qty: 30 capsule, Refills: 5    OVER THE COUNTER MEDICATION Take 2 tablets by mouth at bedtime. Pt takes [employer]'s--Restful Legs.    potassium chloride (K-DUR) 10 MEQ tablet Take 1 tablet (10 mEq total) by mouth daily. Qty: 30 tablet, Refills: 5  pramipexole (MIRAPEX) 0.5 MG tablet Take 0.5 mg by mouth at bedtime.    QUEtiapine (SEROQUEL) 50 MG tablet Take 50 mg by mouth at bedtime.    Rotigotine (NEUPRO) 8 MG/24HR PT24 Place 1 patch onto the skin daily.         If you experience worsening of your admission symptoms, develop shortness of breath, life threatening emergency, suicidal or homicidal thoughts you must seek medical attention immediately by calling 911  or calling your MD immediately if symptoms less severe.  You Must read complete instructions/literature along with all the possible adverse reactions/side effects for all the Medicines you take and that have been prescribed to you. Take any new Medicines after you have completely understood and accept all the possible adverse reactions/side effects.   Please note  You were cared for by a hospitalist during your hospital stay. If you have any questions about your discharge medications or the care you received while you were in the hospital after you are discharged, you can call the unit and asked to speak with the hospitalist on call if the hospitalist that took care of you is not available. Once you are discharged, your primary care physician will handle any further medical issues. Please note that NO REFILLS for any discharge medications will be authorized once you are discharged, as it is imperative that you return to your primary care physician (or establish a relationship with a primary care physician if you do not have one) for your aftercare needs so that they can reassess your need for medications and monitor your lab values. Today   SUBJECTIVE  Doing well   VITAL SIGNS:  Blood pressure 157/77, pulse 58, temperature 98.1 F (36.7 C), temperature source Oral, resp. rate 16, height 6\' 5"  (1.956 m), weight 89.12 kg (196 lb 7.6 oz), SpO2 97 %.  I/O:   Intake/Output Summary (Last 24 hours) at 11/25/14 0813 Last data filed at 11/25/14 0400  Gross per 24 hour  Intake  1435 ml  Output  725 ml  Net  710 ml    PHYSICAL EXAMINATION:  GENERAL: 74 y.o.-year-old patient lying in the bed with no acute distress.  EYES: Pupils equal, round, reactive to light and accommodation. No scleral icterus. Extraocular muscles intact.  HEENT: Head atraumatic, normocephalic. Oropharynx and nasopharynx clear.  NECK: Supple, no jugular venous distention. No thyroid enlargement,  no tenderness.  LUNGS: Normal breath sounds bilaterally, no wheezing, rales,rhonchi or crepitation. No use of accessory muscles of respiration.  CARDIOVASCULAR: S1, S2 normal. No murmurs, rubs, or gallops.  ABDOMEN: Soft, non-tender, non-distended. Bowel sounds present. No organomegaly or mass.  EXTREMITIES:++ pedal edema, cyanosis, or clubbing.  NEUROLOGIC: Cranial nerves II through XII are intact. Muscle strength 5/5 in all extremities. Sensation intact. Gait not checked. parkinsons rest tremors+ PSYCHIATRIC: The patient is alert and oriented x 2.  SKIN: right great toe superficial ulceration no d/c. Dressing+  DATA REVIEW:   CBC   Last Labs      Recent Labs Lab 11/23/14 1607  WBC 8.4  HGB 12.0*  HCT 36.0*  PLT 186      Chemistries   Last Labs      Recent Labs Lab 11/23/14 1607  NA 139  K 4.5  CL 105  CO2 26  GLUCOSE 105*  BUN 32*  CREATININE 1.18  CALCIUM 9.4  AST 21  ALT 6*  ALKPHOS 76  BILITOT 1.2      Microbiology Results   No results found for this  or any previous visit (from the past 240 hour(s)).     To recap, admitted with Urinary retention [R33.9] and Cellulitis of right lower extremity [L03.115].  Had foley placed and then passed voiding trial.  No UTI.   Superficial ulceration of Right great toe s/p excisional debridement, finishing abx today.  Diarrhea improved in meantime.  No fevers.  No new foot lesions.  Still with some urinary urgency but no inability to void.  No abd pain.  Here for f/u today.  Still on baseline parkinsons meds.    R foot is still puffy but clearly improved per patient report.   PMH and SH reviewed  ROS: See HPI, otherwise noncontributory.  Meds, vitals, and allergies reviewed.   nad ncat Tremor noted Chronic postural changes noted.  rrr ctab abd soft, not ttp Ext w/o edema except R foot still slightly puffy.  No erythema.  Debridement site on plantar side of R foot  clean based and not draining.  Superficial lesion remains.

## 2014-12-06 ENCOUNTER — Telehealth: Payer: Self-pay | Admitting: Family Medicine

## 2014-12-06 NOTE — Telephone Encounter (Signed)
Please call pt and check on him re: foot ulcer.  Let me know.  Thanks.

## 2014-12-06 NOTE — Telephone Encounter (Signed)
Spoke tp pt's spouse Joyce GrossKay and she states the area is looking better and seems to be healing---advised her to let us know if anything changes

## 2014-12-07 NOTE — Telephone Encounter (Signed)
Noted. Thanks.

## 2014-12-12 ENCOUNTER — Telehealth: Payer: Self-pay | Admitting: Family Medicine

## 2014-12-12 ENCOUNTER — Other Ambulatory Visit: Payer: Self-pay

## 2014-12-12 NOTE — Telephone Encounter (Signed)
Verbal order given to [name] by telephone as instructed. 

## 2014-12-12 NOTE — Telephone Encounter (Signed)
cb number [phone] [name] with [facility] called -  Request verbal to see pt 2 times for 4 weeks for OT cb number listed above Thank you

## 2014-12-12 NOTE — Telephone Encounter (Signed)
Yes, please. Thanks!

## 2014-12-12 NOTE — Telephone Encounter (Signed)
Hunter Willis pt caretaker(no DPR signed) request rx for fentanyl patch. Call when ready for pick up. Last rx printed # 10 on 11/09/14. Pt last seen 12/01/14.

## 2014-12-13 MED ORDER — FENTANYL 12 MCG/HR TD PT72: 12.5000 ug | MEDICATED_PATCH | TRANSDERMAL | Status: DC

## 2014-12-13 NOTE — Telephone Encounter (Signed)
Caretaker notified that Rx was ready for pickup.

## 2014-12-13 NOTE — Telephone Encounter (Signed)
Printed.  Thanks.  

## 2014-12-26 ENCOUNTER — Telehealth: Payer: Self-pay

## 2014-12-26 DIAGNOSIS — G20 Parkinson's disease: Secondary | ICD-10-CM

## 2014-12-26 NOTE — Telephone Encounter (Signed)
[name] OT with [facility] left [facility]/m requesting written order for w/c due to difficulty with pt mobility due to parkinson faxed to [facility] fax #  [phone].

## 2014-12-27 NOTE — Telephone Encounter (Addendum)
Please print and I'll sign this to fax this over: Wheelchair.  Use for mobility in and out of home.  Gait limited, fall risk due to parkinson's disease Dx G20.   Thanks.   Addendum- I didn't see the order in the EMR.  I signed that.  If the above note is still needed, then let me know.  Thanks.

## 2014-12-27 NOTE — Telephone Encounter (Signed)
Order created in EPIC and [facility]issa Stenson with [facility] advised.

## 2015-01-02 ENCOUNTER — Telehealth: Payer: Self-pay | Admitting: Family Medicine

## 2015-01-02 NOTE — Telephone Encounter (Signed)
Please give the order, dx parkinsons.  Thanks.

## 2015-01-02 NOTE — Telephone Encounter (Signed)
jeffery @ [facility] Needs verbal for  extend skilled nursing services re certify

## 2015-01-03 NOTE — Telephone Encounter (Signed)
Jeffery advised.

## 2015-01-04 ENCOUNTER — Telehealth: Payer: Self-pay

## 2015-01-04 ENCOUNTER — Telehealth: Payer: Self-pay | Admitting: *Deleted

## 2015-01-04 MED ORDER — TAMSULOSIN HCL 0.4 MG PO CAPS: 0.8000 mg | ORAL_CAPSULE | Freq: Every day | ORAL | Status: DC

## 2015-01-04 NOTE — Telephone Encounter (Signed)
[name] from [facility] said pts care taker advised her that pt is taking omeprazole 40 mg daily. Med list has omeprazole 20 mg taking one daily; reviewed last 2 office notes and do not see mention of changing to 40 mg. Please advise. [facility] request cb. On historical med list omeprazole 40 mg is listed from 2013.

## 2015-01-04 NOTE — Telephone Encounter (Signed)
Please have him up flomax to 0.8 mg a day (2x0.4mg  tabs per day).  If already dosed x1 today, then take another pill now.  New rx sent.   Check u/a and ucx with urine sample.  Dx urinary retention.  If unable to void later today, then to ER for a leg bag.  Update me tomorrow.  Thanks.

## 2015-01-04 NOTE — Telephone Encounter (Signed)
[name] notified as instructed by telephone and verbalized understanding.

## 2015-01-04 NOTE — Telephone Encounter (Signed)
Try I&O cath, measure the output and update us.   If not able to place cath or no return, then to ER.  Thanks.

## 2015-01-04 NOTE — Telephone Encounter (Signed)
Hunter Willis was able to perform in and out cath with some resistance and 525 mL of output.  A urine specimen was collected for UA if needed.  Please advise on next steps.

## 2015-01-04 NOTE — Telephone Encounter (Signed)
Trey PaulaJeff, RN with [facility] called with patient update. He has noted a significant increase in swelling of both ankles since last check on 11/27.  The family reports that the patient has not voided since yesterday.  No change in fluid intake.  The patient is currently on Flomax and Lasix.  Trey PaulaJeff is currently in the home and is able to do an in and out cath if necessary, but wanted to get recommendations from PCP first.  Please advise.  (Routing to Dr. Para Marchuncan and Dr. Milinda Antisower - Dr. Para Marchuncan is not in the office this morning)

## 2015-01-04 NOTE — Telephone Encounter (Signed)
Thanks

## 2015-01-04 NOTE — Telephone Encounter (Signed)
Orders and instructions given to [facility].  He will call back with an update.

## 2015-01-04 NOTE — Telephone Encounter (Signed)
I would back down to 20mg  a day.

## 2015-01-04 NOTE — Telephone Encounter (Signed)
Trey PaulaJeff notified as instructed by telephone and verbalized understanding. Trey PaulaJeff stated that he will go ahead and check the u/a and ucx and will get the results back to Dr. Para Marchuncan. Trey PaulaJeff stated that he is not due to go back out this week to see the patient, but will go out tomorrow if he feels that it is necessary. Trey PaulaJeff stated that he will contact the caregiver and give her all of the instructions given by Dr. Para Marchuncan. Trey PaulaJeff stated that he will make sure that Dr. Para Marchuncan gets an update tomorrow.

## 2015-01-05 ENCOUNTER — Other Ambulatory Visit: Payer: Self-pay

## 2015-01-05 ENCOUNTER — Telehealth: Payer: Self-pay | Admitting: Family Medicine

## 2015-01-05 MED ORDER — FENTANYL 12 MCG/HR TD PT72: 12.5000 ug | MEDICATED_PATCH | TRANSDERMAL | Status: DC

## 2015-01-05 NOTE — Telephone Encounter (Signed)
Printed.  Note fill on/after date.

## 2015-01-05 NOTE — Telephone Encounter (Signed)
Junious DresserConnie @ [facility] called Request to extend his OT starting this week 2xweek for 5 weeks

## 2015-01-05 NOTE — Telephone Encounter (Signed)
VM left requesting rx fentanyl patch. Call when ready for pick up.last printed # 10 on 12/13/14. Last f/u appt 12/01/14.

## 2015-01-05 NOTE — Telephone Encounter (Signed)
Please give the order.  Thanks.   

## 2015-01-06 NOTE — Telephone Encounter (Signed)
Spoke to [place] and given verbal order as instructed

## 2015-01-06 NOTE — Telephone Encounter (Signed)
Rx left in front office for pick up and pt's caregiver is aware

## 2015-01-09 ENCOUNTER — Telehealth: Payer: Self-pay | Admitting: *Deleted

## 2015-01-09 MED ORDER — AMOXICILLIN-POT CLAVULANATE 875-125 MG PO TABS: 1.0000 | ORAL_TABLET | Freq: Two times a day (BID) | ORAL | Status: DC

## 2015-01-09 NOTE — Telephone Encounter (Signed)
Start taking mucinex (not mucinex D) once day in the AM with a lot of water.  If sx >1 week, then start augmentin.   If sx <1 week, then hold augmentin for now.  I sent augmentin for now, either to start or hold.  Thanks.

## 2015-01-09 NOTE — Telephone Encounter (Signed)
Kay advised

## 2015-01-09 NOTE — Telephone Encounter (Signed)
Called patient's wife Hunter Gross(Kay) and urine culture results were given to her and she verbalized understanding. Patient's wife stated that patient has a lot of head congestion that is green and a little cough, but no fever.  Patient's wife stated that he has been taking generic Dayquil for this, but it has not helped. Hunter GrossKay stated that it is so hard to bring him in for an appointment and wants to know what Dr. Para Marchuncan would recommend that patient take. Pharmacy [facility]

## 2015-01-10 ENCOUNTER — Telehealth: Payer: Self-pay | Admitting: Family Medicine

## 2015-01-10 NOTE — Telephone Encounter (Signed)
Issue resolved.

## 2015-01-10 NOTE — Telephone Encounter (Signed)
Arline AspCindy tucker called regarding this pt. She states he is returning your call please return call Thank you

## 2015-01-11 ENCOUNTER — Encounter: Payer: Self-pay | Admitting: Family Medicine

## 2015-01-11 ENCOUNTER — Telehealth: Payer: Self-pay

## 2015-01-11 NOTE — Telephone Encounter (Signed)
Cindy with [facility] left v/m returning [facility] about urine culture report that was faxed on 01/06/15; report noted culture shows less than 10,000 colonies of units of bacteria per 1 ml of urine; not considered clinically significant; no bacteria isolated. Any further questions call Cindy back.

## 2015-01-11 NOTE — Telephone Encounter (Signed)
Noted, thanks!

## 2015-01-19 ENCOUNTER — Encounter: Payer: Self-pay | Admitting: Family Medicine

## 2015-01-19 ENCOUNTER — Ambulatory Visit (INDEPENDENT_AMBULATORY_CARE_PROVIDER_SITE_OTHER): Payer: Medicare Other | Admitting: Family Medicine

## 2015-01-19 VITALS — BP 126/80 | HR 67 | Temp 98.3°F | Wt 225.0 lb

## 2015-01-19 DIAGNOSIS — R399 Unspecified symptoms and signs involving the genitourinary system: Secondary | ICD-10-CM | POA: Diagnosis not present

## 2015-01-19 DIAGNOSIS — G20 Parkinson's disease: Secondary | ICD-10-CM | POA: Diagnosis not present

## 2015-01-19 MED ORDER — FINASTERIDE 5 MG PO TABS: 5.0000 mg | ORAL_TABLET | Freq: Every day | ORAL | Status: DC

## 2015-01-19 NOTE — Progress Notes (Signed)
Pre visit review using our clinic review tool, if applicable. No additional management support is needed unless otherwise documented below in the visit note.  Tremor continues to worsen and I asked for him to have f/u with neuro. His caregiver will arrange this.  Is compliant with parkinson's meds.   Urinary retention. Had an acute episode a few weeks ago, requiring I&O cath.  No cath currently.  ucx neg at the time.  flomax increased to BID but still with slow stream.  No other episodes of retention.  Not yet of finasteride.  D/w pt about options.  No burning with urination.  No fevers.   Meds, vitals, and allergies reviewed.   ROS: See HPI.  Otherwise, noncontributory.  nad  kyphosis at baseline rrr ctab abd soft, not ttp Tremor in ext at baseline Flat facial expression, baseline Pleasant in conversation

## 2015-01-19 NOTE — Patient Instructions (Addendum)
Add on finasteride 5mg  a day.  That should gradually help with urination.  If not better in about 1-2 weeks, then let me know.  We can set you up with urology if needed.  Call neurology about a follow up for your tremor.  Take care.  Glad to see you.

## 2015-01-20 NOTE — Assessment & Plan Note (Signed)
Per neuro 

## 2015-01-20 NOTE — Assessment & Plan Note (Addendum)
With prev acute urinary retention but not now.   Add on finasteride and have him update me in about 1-2 weeks.  We can set up uro f/u if needed.  S/p TURP years ago.  Still okay for outpatient f/u.

## 2015-01-22 DIAGNOSIS — I10 Essential (primary) hypertension: Secondary | ICD-10-CM

## 2015-01-22 DIAGNOSIS — N401 Enlarged prostate with lower urinary tract symptoms: Secondary | ICD-10-CM | POA: Diagnosis not present

## 2015-01-22 DIAGNOSIS — R338 Other retention of urine: Secondary | ICD-10-CM | POA: Diagnosis not present

## 2015-01-22 DIAGNOSIS — F0390 Unspecified dementia without behavioral disturbance: Secondary | ICD-10-CM

## 2015-01-22 DIAGNOSIS — R600 Localized edema: Secondary | ICD-10-CM | POA: Diagnosis not present

## 2015-01-22 DIAGNOSIS — G20 Parkinson's disease: Secondary | ICD-10-CM | POA: Diagnosis not present

## 2015-01-31 ENCOUNTER — Emergency Department
Admission: EM | Admit: 2015-01-31 | Discharge: 2015-01-31 | Disposition: A | Discharge status: Home / Self Care | Payer: Medicare Other | Attending: Emergency Medicine | Admitting: Emergency Medicine

## 2015-01-31 ENCOUNTER — Emergency Department: Payer: Medicare Other

## 2015-01-31 ENCOUNTER — Encounter: Payer: Self-pay | Admitting: *Deleted

## 2015-01-31 DIAGNOSIS — Z79891 Long term (current) use of opiate analgesic: Secondary | ICD-10-CM | POA: Diagnosis not present

## 2015-01-31 DIAGNOSIS — S4991XA Unspecified injury of right shoulder and upper arm, initial encounter: Secondary | ICD-10-CM | POA: Diagnosis not present

## 2015-01-31 DIAGNOSIS — W010XXA Fall on same level from slipping, tripping and stumbling without subsequent striking against object, initial encounter: Secondary | ICD-10-CM | POA: Insufficient documentation

## 2015-01-31 DIAGNOSIS — Y998 Other external cause status: Secondary | ICD-10-CM | POA: Diagnosis not present

## 2015-01-31 DIAGNOSIS — I10 Essential (primary) hypertension: Secondary | ICD-10-CM | POA: Diagnosis not present

## 2015-01-31 DIAGNOSIS — Y9389 Activity, other specified: Secondary | ICD-10-CM | POA: Diagnosis not present

## 2015-01-31 DIAGNOSIS — S0181XA Laceration without foreign body of other part of head, initial encounter: Secondary | ICD-10-CM | POA: Diagnosis not present

## 2015-01-31 DIAGNOSIS — Y92002 Bathroom of unspecified non-institutional (private) residence single-family (private) house as the place of occurrence of the external cause: Secondary | ICD-10-CM | POA: Insufficient documentation

## 2015-01-31 DIAGNOSIS — Z79899 Other long term (current) drug therapy: Secondary | ICD-10-CM | POA: Insufficient documentation

## 2015-01-31 DIAGNOSIS — W1839XA Other fall on same level, initial encounter: Secondary | ICD-10-CM | POA: Diagnosis not present

## 2015-01-31 LAB — COMPREHENSIVE METABOLIC PANEL
ALT: 5 U/L — AB (ref 17–63)
ANION GAP: 10 (ref 5–15)
AST: 21 U/L (ref 15–41)
Albumin: 4.6 g/dL (ref 3.5–5.0)
Alkaline Phosphatase: 75 U/L (ref 38–126)
BUN: 45 mg/dL — ABNORMAL HIGH (ref 6–20)
CALCIUM: 10 mg/dL (ref 8.9–10.3)
CHLORIDE: 103 mmol/L (ref 101–111)
CO2: 28 mmol/L (ref 22–32)
CREATININE: 1.39 mg/dL — AB (ref 0.61–1.24)
GFR, EST AFRICAN AMERICAN: 55 mL/min — AB (ref >60)
GFR, EST NON AFRICAN AMERICAN: 48 mL/min — AB (ref >60)
Glucose, Bld: 95 mg/dL (ref 65–99)
Potassium: 4.1 mmol/L (ref 3.5–5.1)
SODIUM: 141 mmol/L (ref 135–145)
Total Bilirubin: 1.1 mg/dL (ref 0.3–1.2)
Total Protein: 7.5 g/dL (ref 6.5–8.1)

## 2015-01-31 LAB — CBC
HCT: 37.9 % — ABNORMAL LOW (ref 40.0–52.0)
Hemoglobin: 12.7 g/dL — ABNORMAL LOW (ref 13.0–18.0)
MCH: 30 pg (ref 26.0–34.0)
MCHC: 33.5 g/dL (ref 32.0–36.0)
MCV: 89.8 fL (ref 80.0–100.0)
PLATELETS: 170 10*3/uL (ref 150–440)
RBC: 4.22 MIL/uL — AB (ref 4.40–5.90)
RDW: 15.1 % — AB (ref 11.5–14.5)
WBC: 8.4 10*3/uL (ref 3.8–10.6)

## 2015-01-31 LAB — TROPONIN I

## 2015-01-31 MED ORDER — LIDOCAINE-EPINEPHRINE (PF) 1 %-1:200000 IJ SOLN
INTRAMUSCULAR | Status: AC
  Filled 2015-01-31: qty 30

## 2015-01-31 MED ORDER — SODIUM CHLORIDE 0.9 % IV SOLN
1000.0000 mL | Freq: Once | INTRAVENOUS | Status: AC
  Administered 2015-01-31: 1000 mL via INTRAVENOUS

## 2015-01-31 NOTE — ED Notes (Signed)
Patient transported to X-ray 

## 2015-01-31 NOTE — ED Notes (Signed)
Pt arrives via EMS with a fall, pt was using walker to go to the bathroom and tripped over his walker, pt arrives in c-collar and laceration to left eyebrow, bleeding controlled, unwitnessed fall, pt complains of right shoulder pain, pt oriented x4 upon arrival, MD at bedside

## 2015-01-31 NOTE — Discharge Instructions (Signed)
I have sent your physician a note about your stay today. Your sutures should be removed in 5 days. 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 Headaches and throwing up (vomiting) are common after a head injury. It should be easy to wake up from sleeping. Sometimes you must stay in the hospital. Most problems happen within the first 24 hours. Side effects may occur up to 7-10 days after the injury.  WHAT ARE THE TYPES OF HEAD INJURIES? Head injuries can be as minor as a bump. Some head injuries can be more severe. More severe head injuries include:  A jarring injury to the brain (concussion).  A bruise of the brain (contusion). This mean there is bleeding in the brain that can cause swelling.  A cracked skull (skull fracture).  Bleeding in the brain that collects, clots, and forms a bump (hematoma). WHEN SHOULD I GET HELP RIGHT AWAY?   You are confused or sleepy.  You cannot be woken up.  You feel sick to your stomach (nauseous) or keep throwing up (vomiting).  Your dizziness or unsteadiness is getting worse.  You have very bad, lasting headaches that are not helped by medicine. Take medicines only as told by your doctor.  You cannot use your arms or legs like normal.  You cannot walk.  You notice changes in the black spots in the center of the colored part of your eye (pupil).  You have clear or bloody fluid coming from your nose or ears.  You have trouble seeing. During the next 24 hours after the injury, you must stay with someone who can watch you. This person should get help right away (call 911 in the [place] if you start to shake and are not able to control it (have seizures), you pass out, or you are unable to wake up. HOW CAN I PREVENT A HEAD INJURY IN THE FUTURE?  Wear seat belts.  Wear a helmet while bike riding and  playing sports like football.  Stay away from dangerous activities around the house. WHEN CAN I RETURN TO NORMAL ACTIVITIES AND ATHLETICS? See your doctor before doing these activities. You should not do normal activities or play contact sports until 1 week after the following symptoms have stopped:  Headache that does not go away.  Dizziness.  Poor attention.  Confusion.  Memory problems.  Sickness to your stomach or throwing up.  Tiredness.  Fussiness.  Bothered by bright lights or loud noises.  Anxiousness or depression.  Restless sleep. MAKE SURE YOU:   Understand these instructions.  Will watch your condition.  Will get help right away if you are not doing well or get worse.   This information is not intended to replace advice given to you by your health care provider. Make sure you discuss any questions you have with your health care provider.   Document Released: 01/04/2008 Document Revised: 02/11/2014 Document Reviewed: 09/28/2012 Elsevier Interactive Patient Education 2016 Elsevier Inc.  Laceration Care, Adult A laceration is a cut that goes through all of the layers of the skin and into the tissue that is right under the skin. Some lacerations heal on their own. Others need to be closed with stitches (sutures), staples, skin adhesive strips, or skin glue. Proper laceration care minimizes the risk of infection and helps the laceration to heal better. HOW  TO CARE FOR YOUR LACERATION If sutures or staples were used:  Keep the wound clean and dry.  If you were given a bandage (dressing), you should change it at least one time per day or as told by your health care provider. You should also change it if it becomes wet or dirty.  Keep the wound completely dry for the first 24 hours or as told by your health care provider. After that time, you may shower or bathe. However, make sure that the wound is not soaked in water until after the sutures or staples have been  removed.  Clean the wound one time each day or as told by your health care provider:  Wash the wound with soap and water.  Rinse the wound with water to remove all soap.  Pat the wound dry with a clean towel. Do not rub the wound.  After cleaning the wound, apply a thin layer of antibiotic ointmentas told by your health care provider. This will help to prevent infection and keep the dressing from sticking to the wound.  Have the sutures or staples removed as told by your health care provider. If skin adhesive strips were used:  Keep the wound clean and dry.  If you were given a bandage (dressing), you should change it at least one time per day or as told by your health care provider. You should also change it if it becomes dirty or wet.  Do not get the skin adhesive strips wet. You may shower or bathe, but be careful to keep the wound dry.  If the wound gets wet, pat it dry with a clean towel. Do not rub the wound.  Skin adhesive strips fall off on their own. You may trim the strips as the wound heals. Do not remove skin adhesive strips that are still stuck to the wound. They will fall off in time. If skin glue was used:  Try to keep the wound dry, but you may briefly wet it in the shower or bath. Do not soak the wound in water, such as by swimming.  After you have showered or bathed, gently pat the wound dry with a clean towel. Do not rub the wound.  Do not do any activities that will make you sweat heavily until the skin glue has fallen off on its own.  Do not apply liquid, cream, or ointment medicine to the wound while the skin glue is in place. Using those may loosen the film before the wound has healed.  If you were given a bandage (dressing), you should change it at least one time per day or as told by your health care provider. You should also change it if it becomes dirty or wet.  If a dressing is placed over the wound, be careful not to apply tape directly over the skin  glue. Doing that may cause the glue to be pulled off before the wound has healed.  Do not pick at the glue. The skin glue usually remains in place for 5-10 days, then it falls off of the skin. General Instructions  Take over-the-counter and prescription medicines only as told by your health care provider.  If you were prescribed an antibiotic medicine or ointment, take or apply it as told by your doctor. Do not stop using it even if your condition improves.  To help prevent scarring, make sure to cover your wound with sunscreen whenever you are outside after stitches are removed, after adhesive strips are removed,  or when glue remains in place and the wound is healed. Make sure to wear a sunscreen of at least 30 SPF.  Do not scratch or pick at the wound.  Keep all follow-up visits as told by your health care provider. This is important.  Check your wound every day for signs of infection. Watch for:  Redness, swelling, or pain.  Fluid, blood, or pus.  Raise (elevate) the injured area above the level of your heart while you are sitting or lying down, if possible. SEEK MEDICAL CARE IF:  You received a tetanus shot and you have swelling, severe pain, redness, or bleeding at the injection site.  You have a fever.  A wound that was closed breaks open.  You notice a bad smell coming from your wound or your dressing.  You notice something coming out of the wound, such as wood or glass.  Your pain is not controlled with medicine.  You have increased redness, swelling, or pain at the site of your wound.  You have fluid, blood, or pus coming from your wound.  You notice a change in the color of your skin near your wound.  You need to change the dressing frequently due to fluid, blood, or pus draining from the wound.  You develop a new rash.  You develop numbness around the wound. SEEK IMMEDIATE MEDICAL CARE IF:  You develop severe swelling around the wound.  Your pain suddenly  increases and is severe.  You develop painful lumps near the wound or on skin that is anywhere on your body.  You have a red streak going away from your wound.  The wound is on your hand or foot and you cannot properly move a finger or toe.  The wound is on your hand or foot and you notice that your fingers or toes look pale or bluish.   This information is not intended to replace advice given to you by your health care provider. Make sure you discuss any questions you have with your health care provider.   Document Released: 01/21/2005 Document Revised: 06/07/2014 Document Reviewed: 01/17/2014 Elsevier Interactive Patient Education [employer]

## 2015-01-31 NOTE — ED Provider Notes (Addendum)
[facility] Emergency Department Provider Note  ____________________________________________  Time seen: On arrival, via EMS  I have reviewed the triage vital signs and the nursing notes.   HISTORY  Chief Complaint Fall and Facial Laceration    HPI Hunter Willis is a 74 y.o. male who presents after a fall. Patient uses a walker to ambulate and does have a history of Parkinson's and apparently tripped on the way to the bathroom. He complains primarily of right shoulder pain but does have a laceration over the left eyebrow and apparently complained to EMS about some neck pain. 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 He denies focal deficits. No chest pain palpitations or shortness of breath. He denies history of blood thinners     Past Medical History  Diagnosis Date  . Allergy   . Hypertension   . GERD (gastroesophageal reflux disease)   . Hyperlipidemia   . Tremor   . BPH (benign prostatic hyperplasia)     followed by Dr. Evelene Croon  . Renal stones   . Parkinson's disease (HCC)   . Edema     Patient Active Problem List   Diagnosis Date Noted  . Lower urinary tract symptoms (LUTS) 12/01/2014  . Pressure ulcer 11/25/2014  . Neck pain 10/14/2014  . Advance care planning 09/05/2013  . Closed hip fracture (HCC) 05/31/2013  . Femoral neck fracture (HCC) 05/30/2013  . Hyperglycemia 08/10/2012  . Hyperlipidemia 05/31/2011  . Medicare annual wellness visit, subsequent 05/31/2011  . Back pain 01/04/2011  . Parkinson's disease (HCC) 05/21/2010  . UNSPECIFIED VITAMIN D DEFICIENCY 08/24/2008  . Essential hypertension 07/23/2006  . ALLERGIC RHINITIS 07/23/2006  . EROSIVE ESOPHAGITIS 07/23/2006  . GERD 07/23/2006  . RENAL CALCULUS, RECURRENT 07/23/2006  . BENIGN PROSTATIC HYPERTROPHY, HX OF 07/23/2006    Past Surgical History  Procedure Laterality Date  . Esophagogastroduodenoscopy  01/1998  . Cystoscopy tumor / condylomata w/ laser   01/26/2001  . Emg  06/23/2001    NCL LE'S wnl, Dr. Kemper Durie  . Lithotripsy  10/2003    kidney stone, Dr. Artis Flock  . Hip arthroplasty Left 05/31/2013    Procedure: LEFT HIP HEMIARTHROPLASTY;  Surgeon: Shelda Pal, MD;  Location: [facility] ORS;  Service: Orthopedics;  Laterality: Left;  . Transurethral resection of prostate      Current Outpatient Rx  Name  Route  Sig  Dispense  Refill  . atenolol (TENORMIN) 25 MG tablet   Oral   Take 1 tablet (25 mg total) by mouth 2 (two) times daily.   180 tablet   3   . benztropine (COGENTIN) 0.5 MG tablet   Oral   Take 2 tablets (1 mg total) by mouth 2 (two) times daily.         . Carbidopa 25 MG tablet   Oral   Take 25 mg by mouth 3 (three) times daily.         . carbidopa-levodopa (SINEMET IR) 25-100 MG tablet   Oral   Take 1.5 tablets by mouth 3 (three) times daily.         Marland Kitchen donepezil (ARICEPT) 5 MG tablet   Oral   Take 5 mg by mouth at bedtime.         . fentaNYL (DURAGESIC - DOSED MCG/HR) 12 MCG/HR   Transdermal   Place 1 patch (12.5 mcg total) onto the skin every 3 (three) days. Fill on/after 01/10/15   10 patch   0   . finasteride (PROSCAR)  5 MG tablet   Oral   Take 1 tablet (5 mg total) by mouth daily.   90 tablet   3   . furosemide (LASIX) 20 MG tablet   Oral   Take 20 mg by mouth daily.         Marland Kitchen glycopyrrolate (ROBINUL) 1 MG tablet   Oral   Take 1 mg by mouth 2 (two) times daily.         Marland Kitchen ibuprofen (ADVIL,MOTRIN) 200 MG tablet   Oral   Take 400 mg by mouth every 6 (six) hours as needed for headache or mild pain.         Marland Kitchen loperamide (IMODIUM A-D) 2 MG tablet   Oral   Take 2 mg by mouth 4 (four) times daily as needed for diarrhea or loose stools.         . magnesium gluconate (MAGONATE) 500 MG tablet   Oral   Take 500 mg by mouth 2 (two) times daily.         . Melatonin 3 MG TABS   Oral   Take 3 mg by mouth at bedtime.         Marland Kitchen omeprazole (PRILOSEC) 20 MG capsule   Oral   Take 1 capsule  (20 mg total) by mouth daily.   30 capsule   5   . OVER THE COUNTER MEDICATION   Oral   Take 2 tablets by mouth at bedtime. Pt takes Hyland's--Restful Legs.         . potassium chloride (K-DUR) 10 MEQ tablet   Oral   Take 1 tablet (10 mEq total) by mouth daily.   30 tablet   5     Take with furosemide/lasix for fluid   . pramipexole (MIRAPEX) 0.5 MG tablet   Oral   Take 0.5 mg by mouth at bedtime.         Marland Kitchen QUEtiapine (SEROQUEL) 50 MG tablet   Oral   Take 50 mg by mouth at bedtime.         . Rotigotine (NEUPRO) 8 MG/24HR PT24   Transdermal   Place 1 patch onto the skin daily.          . tamsulosin (FLOMAX) 0.4 MG CAPS capsule   Oral   Take 2 capsules (0.8 mg total) by mouth daily.   60 capsule   3     Allergies Review of patient's allergies indicates no known allergies.  Family History  Problem Relation Age of Onset  . Diabetes Mother   . Diabetes Sister   . Cancer Brother     prostate, terminal  . Prostate cancer Brother   . Hypertension Brother   . Hypertension Brother   . Hypertension Brother   . Depression Sister   . Cancer Sister     throat  . Heart disease Father   . Heart disease Brother   . Colon cancer Neg Hx     Social History Social History  Substance Use Topics  . Smoking status: Never Smoker   . Smokeless tobacco: None  . Alcohol Use: No    Review of Systems  Constitutional: Negative for fever. Eyes: Negative for visual changes. ENT: No neck pain in the ED Cardiovascular: Negative for chest pain. Respiratory: Negative for shortness of breath. Gastrointestinal: Negative for abdominal pain Genitourinary: Negative for dysuria. Musculoskeletal: Negative for back pain. Positive for right shoulder pain Skin: Positive for laceration Neurological: No focal weakness Psychiatric: No anxiety  ____________________________________________   PHYSICAL EXAM:  VITAL SIGNS: ED Triage Vitals  Enc Vitals Group     BP 01/31/15  0923 137/72 mmHg     Pulse Rate 01/31/15 0923 69     Resp 01/31/15 0923 18     Temp 01/31/15 0923 97.8 F (36.6 C)     Temp Source 01/31/15 0923 Oral     SpO2 01/31/15 0923 96 %     Weight 01/31/15 0923 225 lb (102.059 kg)     Height 01/31/15 0923 6' (1.829 m)     Head Cir --      Peak Flow --      Pain Score 01/31/15 0924 6     Pain Loc --      Pain Edu? --      Excl. in GC? --      Constitutional: Alert and oriented. Well appearing and in no distress. Eyes: Conjunctivae are normal.  ENT   Head: Normocephalic. Approximately 3cm laceration to the left forehead, stellate   Mouth/Throat: Mucous membranes are moist. Cardiovascular: Normal rate, regular rhythm. Normal and symmetric distal pulses are present in all extremities. No murmurs, rubs, or gallops. Respiratory: Normal respiratory effort without tachypnea nor retractions. Breath sounds are clear and equal bilaterally.  Gastrointestinal: Soft and non-tender in all quadrants. No distention. There is no CVA tenderness. Genitourinary: deferred Musculoskeletal: Painful range of motion of the right arm with pain localizing to the right shoulder. No bony adenoids felt. 2+ distal pulses noted. Full range of motion of the lower extremities without pain. No pain with axial load of the larger base bilaterally. Neurologic:  Normal speech and language. No gross focal neurologic deficits are appreciated. Skin:  Skin is warm, dry and intact. No rash noted. Psychiatric: Mood and affect are normal. Patient exhibits appropriate insight and judgment.  ____________________________________________    LABS (pertinent positives/negatives)  Labs Reviewed  CBC  COMPREHENSIVE METABOLIC PANEL  TROPONIN I    ____________________________________________   EKG  ED ECG REPORT I, Jene Every, the attending physician, personally viewed and interpreted this ECG.  Date: 01/31/2015 EKG Time: 9:20 AM Rate: 63 Rhythm: normal sinus  rhythm QRS Axis: normal Intervals: normal ST/T Wave abnormalities: normal Conduction Disutrbances: none Narrative Interpretation: unremarkable   ____________________________________________    RADIOLOGY I have personally reviewed any xrays that were ordered on this patient: No acute injury on CT scan. Discussed internal carotid finding with patient for outpatient follow-up  ____________________________________________   PROCEDURES  Procedure(s) performed: yes  LACERATION REPAIR Performed by: Jene Every Authorized by: Jene Every Consent: Verbal consent obtained. Risks and benefits: risks, benefits and alternatives were discussed Consent given by: patient Patient identity confirmed: provided demographic data Prepped and Draped in normal sterile fashion Wound explored  Laceration Location: Forehead  Laceration Length: 3cm  No Foreign Bodies seen or palpated  Anesthesia: local infiltration  Local anesthetic: lidocaine 1 % with epinephrine  Anesthetic total: 4 ml  Irrigation method: syringe Amount of cleaning: standard  Skin closure: Sutures, 5-0 Ethilon   Number of sutures: 5   Technique: Simple interrupted   Patient tolerance: Patient tolerated the procedure well with no immediate complications.   Critical Care performed: none  ____________________________________________   INITIAL IMPRESSION / ASSESSMENT AND PLAN / ED COURSE  Pertinent labs & imaging results that were available during my care of the patient were reviewed by me and considered in my medical decision making (see chart for details).  Patient with likely mechanical fall with head laceration and  right shoulder pain. We'll obtain head CT, cervical spine CT, right shoulder x-ray, labs EKG and repair his laceration    No acute findings on CT, as noted above discussed internal carotid findings the patient for outpatient follow-up. Duration  repaired ____________________________________________   FINAL CLINICAL IMPRESSION(S) / ED DIAGNOSES  Final diagnoses:  Laceration of forehead, initial encounter  Shoulder injury, right, initial encounter     Jene Everyobert Sheletha Bow, MD 01/31/15 1513  Jene Everyobert Mikenzie Mccannon, MD 02/18/15 [phone]

## 2015-01-31 NOTE — ED Notes (Signed)
Brother in Social workerlaw at bedside. Pt is dressed and in wheelchair. Walker at bedside for home. Went over d/c instructions.

## 2015-02-07 ENCOUNTER — Ambulatory Visit (INDEPENDENT_AMBULATORY_CARE_PROVIDER_SITE_OTHER): Payer: Medicare Other | Admitting: Family Medicine

## 2015-02-07 ENCOUNTER — Encounter: Payer: Self-pay | Admitting: Family Medicine

## 2015-02-07 VITALS — BP 122/54 | HR 64 | Temp 97.6°F | Wt 194.5 lb

## 2015-02-07 DIAGNOSIS — R938 Abnormal findings on diagnostic imaging of other specified body structures: Secondary | ICD-10-CM

## 2015-02-07 DIAGNOSIS — G20 Parkinson's disease: Secondary | ICD-10-CM

## 2015-02-07 DIAGNOSIS — K5900 Constipation, unspecified: Secondary | ICD-10-CM | POA: Diagnosis not present

## 2015-02-07 DIAGNOSIS — I6529 Occlusion and stenosis of unspecified carotid artery: Secondary | ICD-10-CM | POA: Diagnosis not present

## 2015-02-07 DIAGNOSIS — R9389 Abnormal findings on diagnostic imaging of other specified body structures: Secondary | ICD-10-CM

## 2015-02-07 MED ORDER — FENTANYL 12 MCG/HR TD PT72: 12.5000 ug | MEDICATED_PATCH | TRANSDERMAL | Status: DC

## 2015-02-07 NOTE — Progress Notes (Signed)
Pre visit review using our clinic review tool, if applicable. No additional management support is needed unless otherwise documented below in the visit note.  ER for suture removal.  Fell when he got tangled up in his walker.  He has help at home.  Wants to stay at home.  Agrees to using a fall button, d/w pt and caregiver.  Phone number given to them to call.  He was asking if PT can be continued at home given the recent fall.  Has been using [facility].   Carotid abnormality seen on CT, unclear if clinically significant.  D/w pt.  He agrees to f/u with carotid u/s to est a better estimation of carotid narrowing.  Order placed.    He has had large BMs recently.  Prev had started leaking some liquid stool.  No abd pain.  No fevers.  No blood in stool.  On bowel regimen now.  He needs refill on his fentanyl patch which is used for chronic neck pain.    Meds, vitals, and allergies reviewed.   ROS: See HPI.  Otherwise, noncontributory.  nad Healing suture line above the L eye, near the brow.  All sutures (5) removed w/o complication.  The prev lac is healed and doesn't appear infected.  Healing scab noted near R brow line, left intact Mmm rrr ctab abd soft, not ttp, normal BS Ext with trace edema Chronic postural changes noted, esp at the neck.  Tremor at baseline.

## 2015-02-07 NOTE — Patient Instructions (Signed)
Shirlee LimerickMarion will call about your referral for the ultrasound.   See her on the way out.  The cut on the left side of your forehead is healed.  Call about a fall button.  You can call [phone] and see what they have to offer ([facility] [employer]).  I have heard that this complny was helpful.  Keep taking the stools softener daily and see if that helps your GI symptoms.  I'll check on [facility] coming out for a longer period of time.  Take care.  Glad to see you.

## 2015-02-08 ENCOUNTER — Telehealth: Payer: Self-pay | Admitting: Family Medicine

## 2015-02-08 DIAGNOSIS — I6529 Occlusion and stenosis of unspecified carotid artery: Secondary | ICD-10-CM | POA: Insufficient documentation

## 2015-02-08 DIAGNOSIS — K5900 Constipation, unspecified: Secondary | ICD-10-CM | POA: Insufficient documentation

## 2015-02-08 NOTE — Telephone Encounter (Signed)
Thanks

## 2015-02-08 NOTE — Assessment & Plan Note (Signed)
Likely exacerbated by opiates, so we didn't inc his fentanyl patch today.  Recently improved with dedicated daily bowel regimen.  I think he likely had "blow by" diarrhea with leaking stool.  Would continue bowel regimen and update me as needed.   Benign abd exam.  D/w pt he agrees.

## 2015-02-08 NOTE — Telephone Encounter (Signed)
Left message on voice mail  to call back

## 2015-02-08 NOTE — Telephone Encounter (Signed)
-----   Message from Binnie Kandegina C Laws, LPN sent at 0/9/81191/05/2015  4:03 PM EST ----- Terrence Dupontone, [name] notified by telephone. ----- Message -----    From: Joaquim NamGraham S Marion Rosenberry, MD    Sent: 02/08/2015   2:09 PM      To: Annamarie MajorLugene S Fuquay, CMA  Please call and check on [facility] extending his home care.   Can PT continue, with his recent fall?  He still needs it.  Thanks.   Clelia CroftShaw

## 2015-02-08 NOTE — Telephone Encounter (Signed)
Hunter BradfordKimberly with [facility] notified as instructed by telephone and verbalized understanding.

## 2015-02-08 NOTE — Telephone Encounter (Signed)
[facility] is requesting an extension on home health PT  2 times a week for 4 weeks  cb number is [phone]

## 2015-02-08 NOTE — Assessment & Plan Note (Signed)
He asked about possible med changes, but I'll defer to neuro.  He is a fall risk.  We'll check on [facility] extending his home care.  Caregiver to call about a fall alert button.  Sutures removed in meantime today.  Well healed lac.   Continue walker for now when not in wheelchair.   Was in wheelchair during the entirety of the exam.   >25 minutes spent in face to face time with patient, >50% spent in counselling or coordination of care.

## 2015-02-08 NOTE — Telephone Encounter (Signed)
Please call Cala BradfordKimberly w/[facility] [phone]. Thank you.

## 2015-02-08 NOTE — Assessment & Plan Note (Signed)
Will check carotid u/s and we'll update patient.  He agrees.

## 2015-02-13 ENCOUNTER — Telehealth: Payer: Self-pay

## 2015-02-13 NOTE — Telephone Encounter (Signed)
Hunter Willis [facility]m requesting verbal orders to continue home health OT 2 x a week for 2 weeks.

## 2015-02-13 NOTE — Telephone Encounter (Signed)
Please give the order.  Thanks.   

## 2015-02-13 NOTE — Telephone Encounter (Signed)
Verbal order given to Cottonwood Heightsonnie with [facility].

## 2015-02-21 ENCOUNTER — Ambulatory Visit: Payer: Medicare Other

## 2015-02-21 DIAGNOSIS — R938 Abnormal findings on diagnostic imaging of other specified body structures: Secondary | ICD-10-CM

## 2015-02-21 DIAGNOSIS — I6523 Occlusion and stenosis of bilateral carotid arteries: Secondary | ICD-10-CM | POA: Diagnosis not present

## 2015-02-21 DIAGNOSIS — R9389 Abnormal findings on diagnostic imaging of other specified body structures: Secondary | ICD-10-CM

## 2015-03-02 ENCOUNTER — Telehealth: Payer: Self-pay | Admitting: Family Medicine

## 2015-03-02 NOTE — Telephone Encounter (Signed)
Kim@ [email or web address] called  Asking to home health PT For 2 x week for another 4 weeks

## 2015-03-03 NOTE — Telephone Encounter (Signed)
Please give the order.  Thanks.   

## 2015-03-03 NOTE — Telephone Encounter (Signed)
Left detailed message on voicemail of Hunter Willis at [place].

## 2015-03-14 ENCOUNTER — Other Ambulatory Visit: Payer: Self-pay

## 2015-03-14 NOTE — Telephone Encounter (Signed)
Laurie care giver(do not see DPR) left v/m requesting rx for fentanyl; pt has one more patch left;pt last seen and rx last printed # 10 on 02/07/15.Please advise.call when ready for pick up.

## 2015-03-15 MED ORDER — FENTANYL 12 MCG/HR TD PT72: 12.5000 ug | MEDICATED_PATCH | TRANSDERMAL | Status: DC

## 2015-03-15 NOTE — Telephone Encounter (Signed)
Printed.  Thanks.  

## 2015-03-15 NOTE — Telephone Encounter (Signed)
Patient's wife notified by telephone that script is up front and ready for pickup. Mrs. Hamberger wanted to know if Jacki Cones knew about it? Called Jacki Cones and advised her that script is ready for pickup.

## 2015-04-07 ENCOUNTER — Telehealth: Payer: Self-pay

## 2015-04-07 ENCOUNTER — Other Ambulatory Visit: Payer: Self-pay | Admitting: *Deleted

## 2015-04-07 MED ORDER — FUROSEMIDE 20 MG PO TABS: 20.0000 mg | ORAL_TABLET | Freq: Every day | ORAL | Status: DC

## 2015-04-07 MED ORDER — POTASSIUM CHLORIDE ER 10 MEQ PO TBCR: 10.0000 meq | EXTENDED_RELEASE_TABLET | Freq: Every day | ORAL | Status: DC

## 2015-04-07 NOTE — Telephone Encounter (Signed)
Hunter Willis [facility] left v[facility]m; Dr Para Marchuncan is not coming up in [facility] system as certified; [facility] that can sign orders until the issue is resolved; Hunter Willis advised to give Dr Hunter Willis's NPI # so pts needs can be taken care of and Hunter Willis will ck on Dr Hunter Willis's certification. Hunter Willis given Dr Hunter Willis's NPI as instructed.FYI to Dr Hunter Willis.

## 2015-04-12 ENCOUNTER — Other Ambulatory Visit: Payer: Self-pay

## 2015-04-12 MED ORDER — FENTANYL 12 MCG/HR TD PT72: 12.5000 ug | MEDICATED_PATCH | TRANSDERMAL | Status: DC

## 2015-04-12 NOTE — Telephone Encounter (Signed)
Printed.  Thanks.  

## 2015-04-12 NOTE — Telephone Encounter (Signed)
No answer at home number, will try again later

## 2015-04-12 NOTE — Telephone Encounter (Signed)
Jeovanny CarasLorrie Zimmerman returned Rena's call.  She's patient's caretaker and she called in the request for the rx.

## 2015-04-12 NOTE — Telephone Encounter (Signed)
V/M left requesting rx fentanyl patch; last printed # 10 on 03/15/15. Pt last seen 02/07/2015.

## 2015-04-12 NOTE — Telephone Encounter (Signed)
Patient's wife notified by telephone that script is up front ready for pickup. 

## 2015-04-25 ENCOUNTER — Other Ambulatory Visit: Payer: Self-pay | Admitting: *Deleted

## 2015-04-25 MED ORDER — OMEPRAZOLE 20 MG PO CPDR: 20.0000 mg | DELAYED_RELEASE_CAPSULE | Freq: Every day | ORAL | Status: DC

## 2015-05-12 ENCOUNTER — Other Ambulatory Visit: Payer: Self-pay

## 2015-05-12 MED ORDER — FENTANYL 12 MCG/HR TD PT72: 12.5000 ug | MEDICATED_PATCH | TRANSDERMAL | Status: DC

## 2015-05-12 NOTE — Telephone Encounter (Signed)
V/M left requesting rx for fentanyl patch. Call when ready for pick up. Pt has one patch left. rx last printed # 10 on 04/12/15. Last seen 02/07/15.

## 2015-05-12 NOTE — Telephone Encounter (Signed)
Printed- let me know if this doesn't come off at the clinic.  I'll sign on Monday.  Thanks.

## 2015-05-15 NOTE — Telephone Encounter (Signed)
Patient advised.  Rx left at front desk for pick up. 

## 2015-05-21 ENCOUNTER — Emergency Department
Admission: EM | Admit: 2015-05-21 | Discharge: 2015-05-21 | Disposition: A | Discharge status: Home / Self Care | Payer: Medicare Other | Attending: Emergency Medicine | Admitting: Emergency Medicine

## 2015-05-21 ENCOUNTER — Emergency Department: Payer: Medicare Other

## 2015-05-21 DIAGNOSIS — Y999 Unspecified external cause status: Secondary | ICD-10-CM | POA: Diagnosis not present

## 2015-05-21 DIAGNOSIS — W19XXXA Unspecified fall, initial encounter: Secondary | ICD-10-CM

## 2015-05-21 DIAGNOSIS — Z79899 Other long term (current) drug therapy: Secondary | ICD-10-CM | POA: Insufficient documentation

## 2015-05-21 DIAGNOSIS — S7001XA Contusion of right hip, initial encounter: Secondary | ICD-10-CM | POA: Insufficient documentation

## 2015-05-21 DIAGNOSIS — G20 Parkinson's disease: Secondary | ICD-10-CM | POA: Insufficient documentation

## 2015-05-21 DIAGNOSIS — E785 Hyperlipidemia, unspecified: Secondary | ICD-10-CM | POA: Insufficient documentation

## 2015-05-21 DIAGNOSIS — Y92009 Unspecified place in unspecified non-institutional (private) residence as the place of occurrence of the external cause: Secondary | ICD-10-CM | POA: Diagnosis not present

## 2015-05-21 DIAGNOSIS — I10 Essential (primary) hypertension: Secondary | ICD-10-CM | POA: Insufficient documentation

## 2015-05-21 DIAGNOSIS — Y939 Activity, unspecified: Secondary | ICD-10-CM | POA: Insufficient documentation

## 2015-05-21 DIAGNOSIS — Z87448 Personal history of other diseases of urinary system: Secondary | ICD-10-CM | POA: Insufficient documentation

## 2015-05-21 DIAGNOSIS — M25551 Pain in right hip: Secondary | ICD-10-CM | POA: Diagnosis present

## 2015-05-21 DIAGNOSIS — W1839XA Other fall on same level, initial encounter: Secondary | ICD-10-CM | POA: Insufficient documentation

## 2015-05-21 NOTE — ED Notes (Signed)
Patients wife called around 0900. Wife was informed doctor just placed discharge orders and patient was ready to go. Wife stated it would be hours before someone could come get her husband due to it being [place] and family being busy. She was informed we will put patient in a hallway bed until someone arrives. Around 10:30 the patients caregiver showed up to take patient home. Caregiver stated she was concerned about patient going back home due to falling and breaking a bone. I told her I would be more than glad to ask the doctor to come back over and get social work involved. Caretaker went back and forth about 7 times about wanting to see doctor again to not take patient home due to not being able to get around on his home at home with a disabled wife or to take patient home. when helping patient into wheelchair, patient abruptly sat down, which in turn caused my hand to fall into patients chin when trying to sit him down. MD notified about caretakers concerns and situation with caretaker and patient at home. MD stated she will be right over to evaluate patient again and speak with caregiver.

## 2015-05-21 NOTE — ED Notes (Signed)
Caregiver did not wait to see MD again and took patient home without V/S being taken for discharge, signing patient out, and left paperwork here

## 2015-05-21 NOTE — Discharge Instructions (Signed)

## 2015-05-21 NOTE — ED Provider Notes (Addendum)
[facility] Emergency Department Provider Note ____________________________________________  Time seen: Approximately 7:17 AM  I have reviewed the triage vital signs and the nursing notes.   HISTORY  Chief Complaint Fall  HPI Hunter Willis is a 75 y.o. male who states that he lost his footing at home and slipped and fell landing on his right hip. On EMS arrival patient stated that his pain was a 6 out of 10. Patient denies any significant head or neck injury. Patient states that he did not have any dizziness or evidence of syncope. Patient has difficulty walking secondary to his Parkinson's. At this time patient states that the pain in his hip is much improved. Patient denies any other recent illnesses.   Past Medical History  Diagnosis Date  . Allergy   . Hypertension   . GERD (gastroesophageal reflux disease)   . Hyperlipidemia   . Tremor   . BPH (benign prostatic hyperplasia)     followed by Dr. Evelene Croon  . Renal stones   . Parkinson's disease (HCC)   . Edema     Patient Active Problem List   Diagnosis Date Noted  . Carotid artery calcification 02/08/2015  . Constipation 02/08/2015  . Lower urinary tract symptoms (LUTS) 12/01/2014  . Pressure ulcer 11/25/2014  . Neck pain 10/14/2014  . Advance care planning 09/05/2013  . Closed hip fracture (HCC) 05/31/2013  . Femoral neck fracture (HCC) 05/30/2013  . Hyperglycemia 08/10/2012  . Hyperlipidemia 05/31/2011  . Medicare annual wellness visit, subsequent 05/31/2011  . Back pain 01/04/2011  . Parkinson's disease (HCC) 05/21/2010  . UNSPECIFIED VITAMIN D DEFICIENCY 08/24/2008  . Essential hypertension 07/23/2006  . ALLERGIC RHINITIS 07/23/2006  . EROSIVE ESOPHAGITIS 07/23/2006  . GERD 07/23/2006  . RENAL CALCULUS, RECURRENT 07/23/2006  . BENIGN PROSTATIC HYPERTROPHY, HX OF 07/23/2006    Past Surgical History  Procedure Laterality Date  . Esophagogastroduodenoscopy  01/1998  . Cystoscopy  tumor / condylomata w/ laser  01/26/2001  . Emg  06/23/2001    NCL LE'S wnl, Dr. Kemper Durie  . Lithotripsy  10/2003    kidney stone, Dr. Artis Flock  . Hip arthroplasty Left 05/31/2013    Procedure: LEFT HIP HEMIARTHROPLASTY;  Surgeon: Shelda Pal, MD;  Location: [facility] ORS;  Service: Orthopedics;  Laterality: Left;  . Transurethral resection of prostate      Current Outpatient Rx  Name  Route  Sig  Dispense  Refill  . atenolol (TENORMIN) 25 MG tablet   Oral   Take 1 tablet (25 mg total) by mouth 2 (two) times daily.   180 tablet   3   . benztropine (COGENTIN) 0.5 MG tablet   Oral   Take 2 tablets (1 mg total) by mouth 2 (two) times daily.         . Carbidopa 25 MG tablet   Oral   Take 25 mg by mouth 3 (three) times daily.         . carbidopa-levodopa (SINEMET IR) 25-100 MG tablet   Oral   Take 1.5 tablets by mouth 3 (three) times daily.         Marland Kitchen docusate sodium (COLACE) 100 MG capsule   Oral   Take 100 mg by mouth daily.         Marland Kitchen donepezil (ARICEPT) 5 MG tablet   Oral   Take 5 mg by mouth at bedtime.         . fentaNYL (DURAGESIC - DOSED MCG/HR) 12 MCG/HR   Transdermal  Place 1 patch (12.5 mcg total) onto the skin every 3 (three) days. Fill on/after 05/13/15   10 patch   0   . finasteride (PROSCAR) 5 MG tablet   Oral   Take 1 tablet (5 mg total) by mouth daily.   90 tablet   3   . furosemide (LASIX) 20 MG tablet   Oral   Take 1 tablet (20 mg total) by mouth daily.   30 tablet   5   . glycopyrrolate (ROBINUL) 1 MG tablet   Oral   Take 1 mg by mouth 2 (two) times daily.         Marland Kitchen ibuprofen (ADVIL,MOTRIN) 200 MG tablet   Oral   Take 400 mg by mouth every 6 (six) hours as needed for headache or mild pain.         Marland Kitchen loperamide (IMODIUM A-D) 2 MG tablet   Oral   Take 2 mg by mouth 4 (four) times daily as needed for diarrhea or loose stools.         . magnesium gluconate (MAGONATE) 500 MG tablet   Oral   Take 500 mg by mouth 2 (two) times daily.          . Melatonin 3 MG TABS   Oral   Take 3 mg by mouth at bedtime.         Marland Kitchen omeprazole (PRILOSEC) 20 MG capsule   Oral   Take 1 capsule (20 mg total) by mouth daily.   30 capsule   5   . OVER THE COUNTER MEDICATION   Oral   Take 2 tablets by mouth at bedtime. Pt takes [employer]yland's--Restful Legs.         . potassium chloride (K-DUR) 10 MEQ tablet   Oral   Take 1 tablet (10 mEq total) by mouth daily.   30 tablet   5     Take with furosemide/lasix for fluid   . pramipexole (MIRAPEX) 0.5 MG tablet   Oral   Take 0.5 mg by mouth at bedtime.         Marland Kitchen QUEtiapine (SEROQUEL) 50 MG tablet   Oral   Take 50 mg by mouth at bedtime.         . Rotigotine (NEUPRO) 8 MG/24HR PT24   Transdermal   Place 1 patch onto the skin daily.          . tamsulosin (FLOMAX) 0.4 MG CAPS capsule   Oral   Take 2 capsules (0.8 mg total) by mouth daily.   60 capsule   3     Allergies Review of patient's allergies indicates no known allergies.  Family History  Problem Relation Age of Onset  . Diabetes Mother   . Diabetes Sister   . Cancer Brother     prostate, terminal  . Prostate cancer Brother   . Hypertension Brother   . Hypertension Brother   . Hypertension Brother   . Depression Sister   . Cancer Sister     throat  . Heart disease Father   . Heart disease Brother   . Colon cancer Neg Hx     Social History Social History  Substance Use Topics  . Smoking status: Never Smoker   . Smokeless tobacco: None  . Alcohol Use: No    Review of Systems Constitutional: No fever/chills Eyes: No visual changes. ENT: No sore throat. Cardiovascular: Denies chest pain. Respiratory: Denies shortness of breath. Gastrointestinal: No abdominal pain.  No nausea, no vomiting.  No diarrhea.  No constipation. Genitourinary: Negative for dysuria. Musculoskeletal: Negative for back pain.Positive for right hip pain. Skin: Negative for rash. Neurological: Negative for headaches, focal  weakness or numbness.  10-point ROS otherwise negative.  ____________________________________________   PHYSICAL EXAM:  VITAL SIGNS: ED Triage Vitals  Enc Vitals Group     BP 05/21/15 0658 156/110 mmHg     Pulse Rate 05/21/15 0658 69     Resp 05/21/15 0658 16     Temp 05/21/15 0658 97.7 F (36.5 C)     Temp Source 05/21/15 0658 Oral     SpO2 05/21/15 0658 96 %     Weight 05/21/15 0658 215 lb 4.8 oz (97.659 kg)     Height 05/21/15 0658 6\' 5"  (1.956 m)     Head Cir --      Peak Flow --      Pain Score 05/21/15 0659 6     Pain Loc --      Pain Edu? --      Excl. in GC? --     Constitutional: Alert and oriented. Well appearing and in no acute distress. Eyes: Conjunctivae are normal. PERRL. EOMI. Head: Atraumatic. Nose: No congestion/rhinnorhea. Mouth/Throat: Mucous membranes are moist.  Oropharynx non-erythematous. Neck: No stridor.   Cardiovascular: Normal rate, regular rhythm. Grossly normal heart sounds.  Good peripheral circulation. Respiratory: Normal respiratory effort.  No retractions. Lungs CTAB. Gastrointestinal: Soft and nontender. No distention. No abdominal bruits. No CVA tenderness. Musculoskeletal: Patient with minimal right posterior hip tenderness; no edema.  No joint effusions. Neurologic:  Normal speech and language. No gross focal neurologic deficits are appreciated. No gait instability. Skin:  Skin is warm, dry and intact. No rash noted. Psychiatric: Mood and affect are normal. Speech and behavior are normal.  ____________________________________________   LABS (all labs ordered are listed, but only abnormal results are displayed)  Labs Reviewed - No data to display ____________________________________________  EKG  ED ECG REPORT I, Leona Carryaylor,  Kahliyah Dick M, the attending physician, personally viewed and interpreted this ECG.   Date: 05/21/2015  EKG Time: 702  Rate: 70  Rhythm: normal sinus rhythm  Axis: Left axis deviation  Intervals:Poor R-wave  progression  ST&T Change: None  ____________________________________________  RADIOLOGY  Dg Hip Unilat  With Pelvis 2-3 Views Right  05/21/2015  CLINICAL DATA:  Right hip pain following a fall this morning. EXAM: DG HIP (WITH OR WITHOUT PELVIS) 2-3V RIGHT COMPARISON:  None. FINDINGS: A left hip bipolar prosthesis is in place. Diffuse osteopenia. No fracture or dislocation seen. IMPRESSION: No fracture or dislocation. Electronically Signed   By: Beckie SaltsSteven  Reid M.D.   On: 05/21/2015 07:33    ____________________________________________   PROCEDURES  Procedure(s) performed: None  Critical Care performed: No  ____________________________________________   INITIAL IMPRESSION / ASSESSMENT AND PLAN / ED COURSE  Pertinent labs & imaging results that were available during my care of the patient were reviewed by me and considered in my medical decision making (see chart for details).  8:31 AM Patient will get x-ray right hip. Patient's pain level is much improved therefore no need for pain medication. 8:31 AM Pt will be discharged home.  Patient's x-ray right hip was negative. Patient has no other complaints. Patient was told to return immediately if condition worsens. ____________________________________________   FINAL CLINICAL IMPRESSION(S) / ED DIAGNOSES  Final diagnoses:  Fall, initial encounter  Contusion of right hip, initial encounter       Leona CarryLinda M Lilton Pare, MD 05/21/15 0831  Bonita QuinLinda  Orie Fisherman, MD 05/21/15 [phone]

## 2015-05-21 NOTE — ED Notes (Signed)
Pt arrived to ED via EMS from home d/t witnessed fall. EMS reports the pt fell early this morning and landed on his RIGHT hip. Pt states he did not have a syncopal episode, denies dizziness or lightheadedness. Pt states he "simply lost my balance". Pt denies hitting his head, reports only pain in his RIGHT hip with 6/10 pain. EMS reports a h/x of hip fracture in the past (unknown which site) and dementia. Pt is at cognitive baseline per family.

## 2015-05-23 ENCOUNTER — Ambulatory Visit: Payer: Medicare Other | Admitting: Family Medicine

## 2015-05-26 ENCOUNTER — Encounter: Payer: Self-pay | Admitting: Family Medicine

## 2015-05-26 ENCOUNTER — Ambulatory Visit (INDEPENDENT_AMBULATORY_CARE_PROVIDER_SITE_OTHER): Payer: Medicare Other | Admitting: Family Medicine

## 2015-05-26 ENCOUNTER — Other Ambulatory Visit: Payer: Self-pay | Admitting: *Deleted

## 2015-05-26 VITALS — BP 132/58 | HR 77 | Temp 98.2°F | Wt 204.8 lb

## 2015-05-26 DIAGNOSIS — Y92009 Unspecified place in unspecified non-institutional (private) residence as the place of occurrence of the external cause: Secondary | ICD-10-CM

## 2015-05-26 DIAGNOSIS — W19XXXD Unspecified fall, subsequent encounter: Secondary | ICD-10-CM

## 2015-05-26 DIAGNOSIS — N400 Enlarged prostate without lower urinary tract symptoms: Secondary | ICD-10-CM

## 2015-05-26 DIAGNOSIS — R300 Dysuria: Secondary | ICD-10-CM | POA: Diagnosis not present

## 2015-05-26 DIAGNOSIS — J309 Allergic rhinitis, unspecified: Secondary | ICD-10-CM

## 2015-05-26 DIAGNOSIS — W19XXXS Unspecified fall, sequela: Secondary | ICD-10-CM | POA: Diagnosis not present

## 2015-05-26 LAB — POC URINALSYSI DIPSTICK (AUTOMATED)
BILIRUBIN UA: NEGATIVE
GLUCOSE UA: NEGATIVE
Ketones, UA: NEGATIVE
Leukocytes, UA: NEGATIVE
Nitrite, UA: NEGATIVE
Protein, UA: NEGATIVE
RBC UA: NEGATIVE
UROBILINOGEN UA: 4
pH, UA: 5.5

## 2015-05-26 MED ORDER — QUETIAPINE FUMARATE 50 MG PO TABS: 50.0000 mg | ORAL_TABLET | Freq: Every day | ORAL | Status: DC

## 2015-05-26 MED ORDER — FLUTICASONE PROPIONATE 50 MCG/ACT NA SUSP: 2.0000 | Freq: Every day | NASAL | Status: DC

## 2015-05-26 MED ORDER — TAMSULOSIN HCL 0.4 MG PO CAPS: 0.8000 mg | ORAL_CAPSULE | Freq: Every day | ORAL | Status: DC

## 2015-05-26 NOTE — Patient Instructions (Addendum)
Hunter Willis will call about your referral Hunter Willis([facility]).  Take the seroquel only at night.  Go to the lab on the way out.  We'll contact you with your lab report. Add on flonase for allergies in the meantime.  If the urine culture doesn't show anything helpful, we may need to get you set up with urology. Take care.  Glad to see you.

## 2015-05-26 NOTE — Progress Notes (Signed)
Pre visit review using our clinic review tool, if applicable. No additional management support is needed unless otherwise documented below in the visit note.  Fall at home recently, had ER eval.  D/w pt about PT at home.  This is reasonable.  Known parkinsons with continued fall risk.  He agrees to PT referral.   Dysuria.  Some pain with urination, at the initiation of urination.  Some days with more urinary frequency than others. Stream is variable, some days faster than others.  He does better with more fluid intake.  No fevers.  Nocturia- occ, not every night but most nights. Can be up to a few times.  Already on flomax and finasteride.  H/o TURP prev.    More rhinorrhea recently noted, asking about possible allergy meds.  D/w pt.  PD noted.   meds clarified and rx sent for seroquel.  He needs to be taking it nightly, not TID.  He has been on qhs dosing but had an old bottle with TID dosing.  Clarified and correct list given to patient and caregiver.    PMH and SH reviewed  ROS: See HPI, otherwise noncontributory.  Meds, vitals, and allergies reviewed.   nad ncat Some rhinorrhea noted, clear Mmm rrr with soft SEM noted.  ctab abd soft, not ttp, normal BS Ext with 1-2+ BLEedema Chronic postural changes noted, esp at the neck.  fentanyl patch on the back of the neck, noted Tremor at baseline.

## 2015-05-27 LAB — URINE CULTURE
COLONY COUNT: NO GROWTH
Organism ID, Bacteria: NO GROWTH

## 2015-05-28 ENCOUNTER — Encounter: Payer: Self-pay | Admitting: Family Medicine

## 2015-05-28 DIAGNOSIS — W19XXXA Unspecified fall, initial encounter: Secondary | ICD-10-CM | POA: Insufficient documentation

## 2015-05-28 NOTE — Assessment & Plan Note (Signed)
With LUTS and neg culture.  I think we should have him see uro, to see what they have to offer.  It doesn't look like he needs to be on abx at this point.  Labs reviewed, ua and ucx unremarkable.

## 2015-05-28 NOTE — Assessment & Plan Note (Signed)
D/w pt.  Would avoid anticholinergenic meds.  Can try flonse.  D/w pt.  He agrees.

## 2015-05-28 NOTE — Assessment & Plan Note (Signed)
[facility]  Okay for home health, given PD.

## 2015-06-01 DIAGNOSIS — Z9181 History of falling: Secondary | ICD-10-CM | POA: Diagnosis not present

## 2015-06-01 DIAGNOSIS — Z96642 Presence of left artificial hip joint: Secondary | ICD-10-CM

## 2015-06-01 DIAGNOSIS — W010XXD Fall on same level from slipping, tripping and stumbling without subsequent striking against object, subsequent encounter: Secondary | ICD-10-CM

## 2015-06-01 DIAGNOSIS — G20 Parkinson's disease: Secondary | ICD-10-CM | POA: Diagnosis not present

## 2015-06-01 DIAGNOSIS — I10 Essential (primary) hypertension: Secondary | ICD-10-CM | POA: Diagnosis not present

## 2015-06-01 DIAGNOSIS — M6281 Muscle weakness (generalized): Secondary | ICD-10-CM | POA: Diagnosis not present

## 2015-06-02 ENCOUNTER — Telehealth: Payer: Self-pay

## 2015-06-02 NOTE — Telephone Encounter (Signed)
[facility]a [facility]berly [facility] requesting verbal order for home health PT 2 x a week for 7 weeks.

## 2015-06-02 NOTE — Telephone Encounter (Signed)
Left message with order.

## 2015-06-02 NOTE — Telephone Encounter (Signed)
Please give the order.  Thanks.   

## 2015-06-12 ENCOUNTER — Other Ambulatory Visit: Payer: Self-pay

## 2015-06-12 MED ORDER — FENTANYL 12 MCG/HR TD PT72: 12.5000 ug | MEDICATED_PATCH | TRANSDERMAL | Status: DC

## 2015-06-12 NOTE — Telephone Encounter (Signed)
Jacki ConesLaurie pts caretaker left v/m(no DPR signed) requesting rx fentanl patch (last printed # 10 on 05/12/15). Pt last f/u appt on 01/19/15.Please advise.

## 2015-06-12 NOTE — Telephone Encounter (Signed)
Printed.  Thanks.  

## 2015-06-13 NOTE — Telephone Encounter (Signed)
Patient advised.  Rx left at front desk for pick up. 

## 2015-06-15 ENCOUNTER — Ambulatory Visit: Payer: Self-pay

## 2015-06-22 DIAGNOSIS — M6281 Muscle weakness (generalized): Secondary | ICD-10-CM | POA: Diagnosis not present

## 2015-06-22 DIAGNOSIS — Z9181 History of falling: Secondary | ICD-10-CM | POA: Diagnosis not present

## 2015-06-22 DIAGNOSIS — I10 Essential (primary) hypertension: Secondary | ICD-10-CM | POA: Diagnosis not present

## 2015-06-22 DIAGNOSIS — Z96642 Presence of left artificial hip joint: Secondary | ICD-10-CM

## 2015-06-22 DIAGNOSIS — W010XXD Fall on same level from slipping, tripping and stumbling without subsequent striking against object, subsequent encounter: Secondary | ICD-10-CM

## 2015-06-22 DIAGNOSIS — G20 Parkinson's disease: Secondary | ICD-10-CM | POA: Diagnosis not present

## 2015-07-06 ENCOUNTER — Encounter: Payer: Self-pay | Admitting: Urology

## 2015-07-06 ENCOUNTER — Ambulatory Visit (INDEPENDENT_AMBULATORY_CARE_PROVIDER_SITE_OTHER): Payer: Medicare Other | Admitting: Urology

## 2015-07-06 VITALS — Ht 77.0 in | Wt 198.5 lb

## 2015-07-06 DIAGNOSIS — N400 Enlarged prostate without lower urinary tract symptoms: Secondary | ICD-10-CM

## 2015-07-06 DIAGNOSIS — R339 Retention of urine, unspecified: Secondary | ICD-10-CM

## 2015-07-06 DIAGNOSIS — N200 Calculus of kidney: Secondary | ICD-10-CM

## 2015-07-06 DIAGNOSIS — G20 Parkinson's disease: Secondary | ICD-10-CM

## 2015-07-06 DIAGNOSIS — R251 Tremor, unspecified: Secondary | ICD-10-CM | POA: Insufficient documentation

## 2015-07-06 DIAGNOSIS — E785 Hyperlipidemia, unspecified: Secondary | ICD-10-CM | POA: Insufficient documentation

## 2015-07-06 NOTE — Progress Notes (Signed)
07/06/2015 10:53 AM   Lyn Records 1940-09-02 [id number]  Referring provider: Joaquim Nam, MD [address] [place], [place] [address]  Chief Complaint  Patient presents with  . New Patient (Initial Visit)    BPH, Rt testicular swelling     HPI: The patient is a 75 year old gentleman with a past medial history for BPH, nephrolithiasis, and Parkinson's disease presents for evaluation of his BPH symptoms. The patient has severe Parkinson's is unable to provide much history at this time. History is obtained from the electronic medical record in his care giver.  1. BPH Patient is currently on Flomax and finasteride. He does have a past history of a TURP performed by Dr. Sheppard Penton in 2000. Per the caregiver, the patient has to urinate sometimes as frequently as 5 minutes. He often stands at that toilet for up to 10 minutes trying to initiate a stream. He does not feel he empties his bladder. He also has an intermittent stream. He has severe nocturia that we are unable to quantify the caregiver is not with him at night. Though, the patient does complain of getting up all night to urinate. He apparently had to be catheterized approximate 6 months ago for retaining urine.  PVR: 382 cc  2. History of nephrolithiasis The patient has had lithotripsy in the past but Dr. Sheppard Penton in 2005. No symptoms of obstructive uropathy at this time.   PMH: Past Medical History  Diagnosis Date  . Allergy   . Hypertension   . GERD (gastroesophageal reflux disease)   . Hyperlipidemia   . Tremor   . BPH (benign prostatic hyperplasia)     prev followed by Dr. Evelene Croon  . Renal stones   . Parkinson's disease (HCC)   . Edema     Surgical History: Past Surgical History  Procedure Laterality Date  . Esophagogastroduodenoscopy  01/1998  . Cystoscopy tumor / condylomata w/ laser  01/26/2001  . Emg  06/23/2001    NCL LE'S wnl, Dr. Kemper Durie  . Lithotripsy  10/2003    kidney stone, Dr. Artis Flock  . Hip  arthroplasty Left 05/31/2013    Procedure: LEFT HIP HEMIARTHROPLASTY;  Surgeon: Shelda Pal, MD;  Location: [facility];  Service: Orthopedics;  Laterality: Left;  . Transurethral resection of prostate    . Prostate surgery  2000    TURP    Home Medications:    Medication List       This list is accurate as of: 07/06/15 10:53 AM.  Always use your most recent med list.               Acetaminophen 500 MG coapsule  Take by mouth.     atenolol 25 MG tablet  Commonly known as:  TENORMIN  Take 1 tablet (25 mg total) by mouth 2 (two) times daily.     benztropine 0.5 MG tablet  Commonly known as:  COGENTIN  Take 2 tablets (1 mg total) by mouth 2 (two) times daily.     Carbidopa 25 MG tablet  Take 25 mg by mouth 3 (three) times daily.     carbidopa-levodopa 25-100 MG tablet  Commonly known as:  SINEMET IR  Take 1.5 tablets by mouth 3 (three) times daily.     docusate sodium 100 MG capsule  Commonly known as:  COLACE  Take 100 mg by mouth daily.     donepezil 5 MG tablet  Commonly known as:  ARICEPT  Take 5 mg by mouth at  bedtime.     fentaNYL 12 MCG/HR  Commonly known as:  DURAGESIC - dosed mcg/hr  Place 1 patch (12.5 mcg total) onto the skin every 3 (three) days. Fill on/after 06/12/15     finasteride 5 MG tablet  Commonly known as:  PROSCAR  Take 1 tablet (5 mg total) by mouth daily.     fluticasone 50 MCG/ACT nasal spray  Commonly known as:  FLONASE  Place 2 sprays into both nostrils daily.     furosemide 20 MG tablet  Commonly known as:  LASIX  Take 1 tablet (20 mg total) by mouth daily.     glycopyrrolate 1 MG tablet  Commonly known as:  ROBINUL  Take 1 mg by mouth 2 (two) times daily.     ibuprofen 200 MG tablet  Commonly known as:  ADVIL,MOTRIN  Take 400 mg by mouth every 6 (six) hours as needed for headache or mild pain.     loperamide 2 MG tablet  Commonly known as:  IMODIUM A-D  Take 2 mg by mouth 4 (four) times daily as needed for diarrhea or loose  stools.     magnesium gluconate 500 MG tablet  Commonly known as:  MAGONATE  Take 500 mg by mouth 2 (two) times daily.     Melatonin 3 MG Tabs  Take 3 mg by mouth at bedtime.     NEUPRO 8 MG/24HR Pt24  Generic drug:  Rotigotine  Place 1 patch onto the skin daily.     omeprazole 20 MG capsule  Commonly known as:  PRILOSEC  Take 1 capsule (20 mg total) by mouth daily.     OVER THE COUNTER MEDICATION  Take 2 tablets by mouth at bedtime. Pt takes Hyland's--Restful Legs.     potassium chloride 10 MEQ tablet  Commonly known as:  K-DUR  Take 1 tablet (10 mEq total) by mouth daily.     pramipexole 0.5 MG tablet  Commonly known as:  MIRAPEX  Take 0.5 mg by mouth at bedtime.     promethazine 25 MG tablet  Commonly known as:  PHENERGAN  Take by mouth.     QUEtiapine 50 MG tablet  Commonly known as:  SEROQUEL  Take 1 tablet (50 mg total) by mouth at bedtime.     tamsulosin 0.4 MG Caps capsule  Commonly known as:  FLOMAX  Take 2 capsules (0.8 mg total) by mouth daily.        Allergies: No Known Allergies  Family History: Family History  Problem Relation Age of Onset  . Diabetes Mother   . Diabetes Sister   . Cancer Brother     prostate, terminal  . Prostate cancer Brother   . Hypertension Brother   . Hypertension Brother   . Hypertension Brother   . Depression Sister   . Cancer Sister     throat  . Heart disease Father   . Heart disease Brother   . Colon cancer Neg Hx     Social History:  reports that he has never smoked. He does not have any smokeless tobacco history on file. He reports that he does not drink alcohol. His drug history is not on file.  ROS: UROLOGY Frequent Urination?: Yes Hard to postpone urination?: Yes Burning/pain with urination?: Yes Get up at night to urinate?: Yes Leakage of urine?: Yes Urine stream starts and stops?: Yes Trouble starting stream?: Yes Do you have to strain to urinate?: No Blood in urine?: No Urinary tract  infection?: No  Sexually transmitted disease?: No Injury to kidneys or bladder?: No Painful intercourse?: No Weak stream?: No Erection problems?: Yes Penile pain?: No  Gastrointestinal Nausea?: Yes Vomiting?: No Indigestion/heartburn?: No Diarrhea?: Yes Constipation?: Yes  Constitutional Fever: No Night sweats?: Yes Weight loss?: No Fatigue?: Yes  Skin Skin rash/lesions?: Yes Itching?: No  Eyes Blurred vision?: No Double vision?: Yes  Ears/Nose/Throat Sore throat?: No Sinus problems?: Yes  Hematologic/Lymphatic Swollen glands?: No Easy bruising?: No  Cardiovascular Leg swelling?: Yes Chest pain?: No  Respiratory Cough?: No Shortness of breath?: No  Endocrine Excessive thirst?: Yes  Musculoskeletal Back pain?: No Joint pain?: Yes  Neurological Headaches?: No Dizziness?: Yes  Psychologic Depression?: No Anxiety?: No  Physical Exam: Ht  (1.956 m)  Wt 198 lb 8 oz (90.039 kg)  BMI 23.53 kg/m2  Constitutional:  Alert and oriented, No acute distress. HEENT: [place] AT, moist mucus membranes.  Trachea midline, no masses. Cardiovascular: No clubbing, cyanosis, or edema. Respiratory: Normal respiratory effort, no increased work of breathing. GI: Abdomen is soft, nontender, nondistended, no abdominal masses GU: No CVA tenderness. Normal phallus. Testicles descended equally bilaterally. Reducible right inguinal hernia noted. DRE: 1+. Benign. Skin: No rashes, bruises or suspicious lesions. Lymph: No cervical or inguinal adenopathy. Neurologic: Grossly intact, no focal deficits, moving all 4 extremities. Psychiatric: Normal mood and affect.  Laboratory Data: Lab Results  Component Value Date   WBC 8.4 01/31/2015   HGB 12.7* 01/31/2015   HCT 37.9* 01/31/2015   MCV 89.8 01/31/2015   PLT 170 01/31/2015    Lab Results  Component Value Date   CREATININE 1.39* 01/31/2015    Lab Results  Component Value Date   PSA 0.67 05/21/2011   PSA 0.98  09/07/2009   PSA 0.62 08/22/2008    No results found for: TESTOSTERONE  No results found for: HGBA1C  Urinalysis    Component Value Date/Time   COLORURINE YELLOW* 11/23/2014 1607   COLORURINE Yellow 12/10/2013 1746   APPEARANCEUR CLEAR* 11/23/2014 1607   APPEARANCEUR Clear 12/10/2013 1746   LABSPEC 1.015 11/23/2014 1607   LABSPEC 1.018 12/10/2013 1746   PHURINE 5.0 11/23/2014 1607   PHURINE 6.0 12/10/2013 1746   GLUCOSEU NEGATIVE 11/23/2014 1607   GLUCOSEU Negative 12/10/2013 1746   HGBUR NEGATIVE 11/23/2014 1607   HGBUR 1+ 12/10/2013 1746   BILIRUBINUR Neg 05/26/2015 1426   BILIRUBINUR NEGATIVE 11/23/2014 1607   BILIRUBINUR Negative 12/10/2013 1746   KETONESUR NEGATIVE 11/23/2014 1607   KETONESUR 1+ 12/10/2013 1746   PROTEINUR Neg 05/26/2015 1426   PROTEINUR NEGATIVE 11/23/2014 1607   PROTEINUR Negative 12/10/2013 1746   UROBILINOGEN 4.0 05/26/2015 1426   NITRITE Neg 05/26/2015 1426   NITRITE NEGATIVE 11/23/2014 1607   NITRITE Negative 12/10/2013 1746   LEUKOCYTESUR Negative 05/26/2015 1426   LEUKOCYTESUR Negative 12/10/2013 1746      Assessment & Plan:    1. BPH 2. Parkinson's disease 3. Urinary retention 4. History of nephrolithiasis I had a long discussion with the patient's caregiver regarding the patient's problems are multifactorial. 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 This is likely the main problem for this patient. He also is in urinary retention at this time. So we placed a Foley catheter to dependent drainage for bladder decompression. We'll have him follow-up in one month for a trial of void. I did discuss the caregiver that he is more than likely to fail this trial of void. If this  were to happen, he would be relegated to long-standing bladder drainage with a Foley catheter.   Return in about 4 weeks (around 08/03/2015)  for trial of void, possible foley exchange.  Hildred Laser, MD  [facility] [address], [address] [place], [place] [address] [phone]

## 2015-07-06 NOTE — Progress Notes (Signed)
Simple Catheter Placement  Due to urinary retention patient is present today for a foley cath placement.  Patient was cleaned and prepped in a sterile fashion with betadine and lidocaine jelly 2% was instilled into the urethra.  A 16 FR coude foley catheter was inserted, urine return was noted  300 ml, urine was dark yellow in color.  The balloon was filled with 10cc of sterile water.  A night  bag was attached for drainage. Patient was also given a night bag to take home and was given instruction on how to change from one bag to another.  Patient/Caregiver was given instruction on proper catheter care.  Patient tolerated well, no complications were noted   Preformed by: K.Adline Kirshenbaum,CMA  Additional notes/ Follow up: 6weeks

## 2015-07-06 NOTE — Addendum Note (Signed)
Addended by: Lonna CobbUSSELL, Lexianna Weinrich L on: 07/06/2015 03:10 PM   Modules accepted: Medications

## 2015-07-10 ENCOUNTER — Other Ambulatory Visit: Payer: Self-pay

## 2015-07-10 MED ORDER — FENTANYL 12 MCG/HR TD PT72: 12.5000 ug | MEDICATED_PATCH | TRANSDERMAL | Status: DC

## 2015-07-10 NOTE — Telephone Encounter (Signed)
V/M left requesting rx fentanyl patch; call when ready for pick up. rx last printed # 10 on 06/12/15.last seen 05/26/15.Please advise.

## 2015-07-10 NOTE — Telephone Encounter (Signed)
Printed.  Thanks.  

## 2015-07-11 NOTE — Telephone Encounter (Signed)
Patient advised.  Rx left at front desk for pick up. 

## 2015-07-13 ENCOUNTER — Other Ambulatory Visit: Payer: Self-pay

## 2015-07-13 NOTE — Telephone Encounter (Signed)
V/M left requesting Dr Para Marchuncan to [facility].Dr Para Marchuncan advised pt to take 2 tabs bid; [facility] received rx from Dr Clelia CroftShaw neurologist to take one tab bid. Pt is going to run out of med and request Dr Para Marchuncan to refill this med taking 2 tabs bid. I do not see on current or hx med list where Dr Para Marchuncan has filled this med.Please advise.

## 2015-07-14 MED ORDER — BENZTROPINE MESYLATE 0.5 MG PO TABS: 1.0000 mg | ORAL_TABLET | Freq: Two times a day (BID) | ORAL | Status: DC

## 2015-07-14 NOTE — Telephone Encounter (Signed)
If he's been on 2 tab BID, then would continue.  Thanks.  Sent.

## 2015-07-19 ENCOUNTER — Telehealth: Payer: Self-pay

## 2015-07-19 NOTE — Telephone Encounter (Signed)
[facility].

## 2015-07-19 NOTE — Telephone Encounter (Signed)
Left message on voice mail  to call back

## 2015-07-19 NOTE — Telephone Encounter (Signed)
Hunter BradfordKimberly with [facility] left v/m requesting verbal orders to extend home health PT 2 x a week for additional 9 weeks for neuromuscular reeducation and muscle strengthening.

## 2015-07-19 NOTE — Telephone Encounter (Signed)
Please give the order.  Thanks.   

## 2015-07-25 ENCOUNTER — Other Ambulatory Visit: Payer: Medicare Other

## 2015-08-07 ENCOUNTER — Ambulatory Visit (INDEPENDENT_AMBULATORY_CARE_PROVIDER_SITE_OTHER): Payer: Medicare Other | Admitting: Urology

## 2015-08-07 VITALS — BP 129/66 | HR 67 | Ht 76.0 in | Wt 197.0 lb

## 2015-08-07 DIAGNOSIS — R339 Retention of urine, unspecified: Secondary | ICD-10-CM

## 2015-08-07 NOTE — Progress Notes (Signed)
08/07/2015 10:30 AM   Lyn RecordsJerry D Yasuda 03/01/1940 [id number]  Referring provider: Joaquim NamGraham S Duncan, MD [address] [place], [place] [address]  Chief Complaint  Patient presents with  . Urinary Retention    57month with voiding trial     HPI: The patient is a 75 year old gentleman with a past medial history for BPH, nephrolithiasis, and Parkinson's disease presents for evaluation of his BPH symptoms. The patient has severe Parkinson's is unable to provide much history at this time. History is obtained from the electronic medical record in his care giver.  1. BPH Patient is currently on Flomax and finasteride. He does have a past history of a TURP performed by Dr. Sheppard PentonWolf in 2000. Per the caregiver, the patient has to urinate sometimes as frequently as 5 minutes. He often stands at that toilet for up to 10 minutes trying to initiate a stream. He does not feel he empties his bladder. He also has an intermittent stream. He has severe nocturia that we are unable to quantify the caregiver is not with him at night. Though, the patient does complain of getting up all night to urinate. He apparently had to be catheterized approximate 6 months ago for retaining urine.  The patient and family returns after Foley was placed 382 cc. The patient, family, caregivers are very happy since the catheters placed. It was recurrent burden on the patient and family as he had to void every 5 minutes. He also struggled to void at those times. He is able to sleep now at night is not able to before. He would like to keep the catheter.   2. History of nephrolithiasis The patient has had lithotripsy in the past but Dr. Sheppard PentonWolf in 2005. No symptoms of obstructive uropathy at this time.    PMH: Past Medical History  Diagnosis Date  . Allergy   . Hypertension   . GERD (gastroesophageal reflux disease)   . Hyperlipidemia   . Tremor   . BPH (benign prostatic hyperplasia)     prev followed by Dr. Evelene CroonWolff  . Renal  stones   . Parkinson's disease (HCC)   . Edema     Surgical History: Past Surgical History  Procedure Laterality Date  . Esophagogastroduodenoscopy  01/1998  . Cystoscopy tumor / condylomata w/ laser  01/26/2001  . Emg  06/23/2001    NCL LE'S wnl, Dr. Kemper Durielarke  . Lithotripsy  10/2003    kidney stone, Dr. Artis FlockWolfe  . Hip arthroplasty Left 05/31/2013    Procedure: LEFT HIP HEMIARTHROPLASTY;  Surgeon: Shelda PalMatthew D Olin, MD;  Location: [facility];  Service: Orthopedics;  Laterality: Left;  . Transurethral resection of prostate    . Prostate surgery  2000    TURP    Home Medications:    Medication List       This list is accurate as of: 08/07/15 10:30 AM.  Always use your most recent med list.               Acetaminophen 500 MG coapsule  Take by mouth.     atenolol 25 MG tablet  Commonly known as:  TENORMIN  Take 1 tablet (25 mg total) by mouth 2 (two) times daily.     benztropine 0.5 MG tablet  Commonly known as:  COGENTIN  Take 2 tablets (1 mg total) by mouth 2 (two) times daily.     Carbidopa 25 MG tablet  Take 25 mg by mouth 3 (three) times daily. Reported on 07/06/2015  carbidopa-levodopa 25-100 MG tablet  Commonly known as:  SINEMET IR  Take 1.5 tablets by mouth 3 (three) times daily.     docusate sodium 100 MG capsule  Commonly known as:  COLACE  Take 100 mg by mouth daily.     donepezil 5 MG tablet  Commonly known as:  ARICEPT  Take 5 mg by mouth at bedtime.     fentaNYL 12 MCG/HR  Commonly known as:  DURAGESIC - dosed mcg/hr  Place 1 patch (12.5 mcg total) onto the skin every 3 (three) days.     finasteride 5 MG tablet  Commonly known as:  PROSCAR  Take 1 tablet (5 mg total) by mouth daily.     fluticasone 50 MCG/ACT nasal spray  Commonly known as:  FLONASE  Place 2 sprays into both nostrils daily.     furosemide 20 MG tablet  Commonly known as:  LASIX  Take 1 tablet (20 mg total) by mouth daily.     glycopyrrolate 1 MG tablet  Commonly known as:   ROBINUL  Take 1 mg by mouth 2 (two) times daily.     ibuprofen 200 MG tablet  Commonly known as:  ADVIL,MOTRIN  Take 400 mg by mouth every 6 (six) hours as needed for headache or mild pain.     loperamide 2 MG tablet  Commonly known as:  IMODIUM A-D  Take 2 mg by mouth 4 (four) times daily as needed for diarrhea or loose stools.     magnesium gluconate 500 MG tablet  Commonly known as:  MAGONATE  Take 500 mg by mouth 2 (two) times daily.     Melatonin 3 MG Tabs  Take 3 mg by mouth at bedtime.     NEUPRO 8 MG/24HR Pt24  Generic drug:  Rotigotine  Place 1 patch onto the skin daily.     omeprazole 20 MG capsule  Commonly known as:  PRILOSEC  Take 1 capsule (20 mg total) by mouth daily.     OVER THE COUNTER MEDICATION  Take 2 tablets by mouth at bedtime. Pt takes Hyland's--Restful Legs.     potassium chloride 10 MEQ tablet  Commonly known as:  K-DUR  Take 1 tablet (10 mEq total) by mouth daily.     pramipexole 0.5 MG tablet  Commonly known as:  MIRAPEX  Take 0.5 mg by mouth at bedtime.     promethazine 25 MG tablet  Commonly known as:  PHENERGAN  Take by mouth.     QUEtiapine 50 MG tablet  Commonly known as:  SEROQUEL  Take 1 tablet (50 mg total) by mouth at bedtime.     tamsulosin 0.4 MG Caps capsule  Commonly known as:  FLOMAX  Take 2 capsules (0.8 mg total) by mouth daily.        Allergies: No Known Allergies  Family History: Family History  Problem Relation Age of Onset  . Diabetes Mother   . Diabetes Sister   . Cancer Brother     prostate, terminal  . Prostate cancer Brother   . Hypertension Brother   . Hypertension Brother   . Hypertension Brother   . Depression Sister   . Cancer Sister     throat  . Heart disease Father   . Heart disease Brother   . Colon cancer Neg Hx     Social History:  reports that he has never smoked. He does not have any smokeless tobacco history on file. He reports that he does not drink  alcohol. His drug history is  not on file.  ROS: UROLOGY Frequent Urination?: Yes Hard to postpone urination?: No Burning/pain with urination?: Yes Get up at night to urinate?: Yes Leakage of urine?: No Urine stream starts and stops?: No Trouble starting stream?: Yes Do you have to strain to urinate?: No Blood in urine?: No Urinary tract infection?: No Sexually transmitted disease?: No Injury to kidneys or bladder?: No Painful intercourse?: No Weak stream?: No Erection problems?: No Penile pain?: No  Gastrointestinal Nausea?: No Vomiting?: No Indigestion/heartburn?: No Diarrhea?: No Constipation?: Yes  Constitutional Fever: No Night sweats?: No Weight loss?: No Fatigue?: No  Skin Skin rash/lesions?: No Itching?: No  Eyes Blurred vision?: No Double vision?: No  Ears/Nose/Throat Sore throat?: No Sinus problems?: No  Hematologic/Lymphatic Swollen glands?: No Easy bruising?: No  Cardiovascular Leg swelling?: No Chest pain?: No  Respiratory Cough?: No Shortness of breath?: No  Endocrine Excessive thirst?: No  Musculoskeletal Back pain?: No Joint pain?: No  Neurological Dizziness?: No  Psychologic Depression?: No Anxiety?: No  Physical Exam: BP 129/66 mmHg  Pulse 67  Ht 6\' 4"  (1.93 m)  Wt 197 lb (89.359 kg)  BMI 23.99 kg/m2  Constitutional:  Alert and oriented, No acute distress. HEENT: [place] AT, moist mucus membranes.  Trachea midline, no masses. Cardiovascular: No clubbing, cyanosis, or edema. Respiratory: Normal respiratory effort, no increased work of breathing. GI: Abdomen is soft, nontender, nondistended, no abdominal masses GU: No CVA tenderness.  Skin: No rashes, bruises or suspicious lesions. Lymph: No cervical or inguinal adenopathy. Neurologic: Grossly intact, no focal deficits, moving all 4 extremities. Psychiatric: Normal mood and affect.  Laboratory Data: Lab Results  Component Value Date   WBC 8.4 01/31/2015   HGB 12.7* 01/31/2015   HCT 37.9*  01/31/2015   MCV 89.8 01/31/2015   PLT 170 01/31/2015    Lab Results  Component Value Date   CREATININE 1.39* 01/31/2015    Lab Results  Component Value Date   PSA 0.67 05/21/2011   PSA 0.98 09/07/2009   PSA 0.62 08/22/2008    No results found for: TESTOSTERONE  No results found for: HGBA1C  Urinalysis    Component Value Date/Time   COLORURINE YELLOW* 11/23/2014 1607   COLORURINE Yellow 12/10/2013 1746   APPEARANCEUR CLEAR* 11/23/2014 1607   APPEARANCEUR Clear 12/10/2013 1746   LABSPEC 1.015 11/23/2014 1607   LABSPEC 1.018 12/10/2013 1746   PHURINE 5.0 11/23/2014 1607   PHURINE 6.0 12/10/2013 1746   GLUCOSEU NEGATIVE 11/23/2014 1607   GLUCOSEU Negative 12/10/2013 1746   HGBUR NEGATIVE 11/23/2014 1607   HGBUR 1+ 12/10/2013 1746   BILIRUBINUR Neg 05/26/2015 1426   BILIRUBINUR NEGATIVE 11/23/2014 1607   BILIRUBINUR Negative 12/10/2013 1746   KETONESUR NEGATIVE 11/23/2014 1607   KETONESUR 1+ 12/10/2013 1746   PROTEINUR Neg 05/26/2015 1426   PROTEINUR NEGATIVE 11/23/2014 1607   PROTEINUR Negative 12/10/2013 1746   UROBILINOGEN 4.0 05/26/2015 1426   NITRITE Neg 05/26/2015 1426   NITRITE NEGATIVE 11/23/2014 1607   NITRITE Negative 12/10/2013 1746   LEUKOCYTESUR Negative 05/26/2015 1426   LEUKOCYTESUR Negative 12/10/2013 1746      Assessment & Plan:    1. BPH 2. Parkinson's disease 3. Urinary retention 4. History of nephrolithiasis The patient, family, and caregivers requesting to keep the Foley catheter at this time as his Goldblatt is dramatically better especially since he is not voiding every 5 minutes. 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 We will  replace his Foley today. He'll follow-up monthly with the nurses for a catheter exchange. He will follow-up for physician appointment in 1 year to assess his progress.  Return in about 4 weeks (around 09/04/2015) for nurse visit for foley exchange, one year MD  appt.  Hildred LaserBrian James Ashton Belote, MD  [facility] [address], [address] [place], [place] [phone]

## 2015-08-07 NOTE — Progress Notes (Signed)
Fill and Pull Catheter Removal  Patient is present today for a catheter removal.  Patient was cleaned and prepped in a sterile fashion 300ml of sterile water/ saline was instilled into the bladder when the patient felt the urge to urinate. 10ml of water was then drained from the balloon.  A 16FR coude foley cath was removed from the bladder no complications were noted .  Patient as then given some time to void on their own.  Patient can void  100ml on their own after some time.  Patient tolerated well.  Preformed by: Eligha BridegroomSarah Warrick Llera, CMA  Per Dr. Sherryl BartersBudzyn patient is to have catheter replaced and have monthly cath changes in the office.   Simple Catheter Placement  Due to urinary retention patient is present today for a foley cath placement.  Patient was cleaned and prepped in a sterile fashion with betadine and lidocaine jelly 2% was instilled into the urethra.  A 16 FR coude foley catheter was inserted, urine return was noted  100ml, urine was clear yellow in color.  The balloon was filled with 10cc of sterile water.  A night bag was attached for drainage. Patient was also given a night bag to take home and was given instruction on how to change from one bag to another.  Patient was given instruction on proper catheter care.  Patient tolerated well, no complications were noted   Preformed by: Eligha BridegroomSarah Callahan Peddie, CMA  Additional notes/ Follow up: 1 month cath change

## 2015-08-09 ENCOUNTER — Other Ambulatory Visit: Payer: Self-pay

## 2015-08-09 MED ORDER — FENTANYL 12 MCG/HR TD PT72: 12.5000 ug | MEDICATED_PATCH | TRANSDERMAL | Status: DC

## 2015-08-09 NOTE — Telephone Encounter (Signed)
Printed.  Thanks.  

## 2015-08-09 NOTE — Telephone Encounter (Signed)
V/M left rquesting rx fentanyl patch; last printed # 10 on 07/10/15. Pt last seen acute visit 05/26/15. Call when ready for pick up.

## 2015-08-10 NOTE — Telephone Encounter (Signed)
Tried to phone patient's home, no answer.  Rx left at front desk for pick up.

## 2015-08-10 NOTE — Telephone Encounter (Signed)
Wife advised. 

## 2015-08-18 ENCOUNTER — Emergency Department: Payer: Medicare Other

## 2015-08-18 ENCOUNTER — Emergency Department
Admission: EM | Admit: 2015-08-18 | Discharge: 2015-08-18 | Disposition: A | Discharge status: Home / Self Care | Payer: Medicare Other | Attending: Emergency Medicine | Admitting: Emergency Medicine

## 2015-08-18 ENCOUNTER — Encounter: Payer: Self-pay | Admitting: Emergency Medicine

## 2015-08-18 DIAGNOSIS — Y999 Unspecified external cause status: Secondary | ICD-10-CM | POA: Diagnosis not present

## 2015-08-18 DIAGNOSIS — Z8719 Personal history of other diseases of the digestive system: Secondary | ICD-10-CM | POA: Insufficient documentation

## 2015-08-18 DIAGNOSIS — I10 Essential (primary) hypertension: Secondary | ICD-10-CM | POA: Insufficient documentation

## 2015-08-18 DIAGNOSIS — G20 Parkinson's disease: Secondary | ICD-10-CM | POA: Insufficient documentation

## 2015-08-18 DIAGNOSIS — N390 Urinary tract infection, site not specified: Secondary | ICD-10-CM | POA: Insufficient documentation

## 2015-08-18 DIAGNOSIS — Z978 Presence of other specified devices: Secondary | ICD-10-CM

## 2015-08-18 DIAGNOSIS — Z79899 Other long term (current) drug therapy: Secondary | ICD-10-CM | POA: Insufficient documentation

## 2015-08-18 DIAGNOSIS — W07XXXA Fall from chair, initial encounter: Secondary | ICD-10-CM | POA: Insufficient documentation

## 2015-08-18 DIAGNOSIS — Y939 Activity, unspecified: Secondary | ICD-10-CM | POA: Insufficient documentation

## 2015-08-18 DIAGNOSIS — S79911A Unspecified injury of right hip, initial encounter: Secondary | ICD-10-CM | POA: Diagnosis present

## 2015-08-18 DIAGNOSIS — Z9181 History of falling: Secondary | ICD-10-CM

## 2015-08-18 DIAGNOSIS — S7001XA Contusion of right hip, initial encounter: Secondary | ICD-10-CM | POA: Diagnosis not present

## 2015-08-18 DIAGNOSIS — R410 Disorientation, unspecified: Secondary | ICD-10-CM | POA: Diagnosis not present

## 2015-08-18 DIAGNOSIS — M542 Cervicalgia: Secondary | ICD-10-CM | POA: Diagnosis not present

## 2015-08-18 DIAGNOSIS — Z7951 Long term (current) use of inhaled steroids: Secondary | ICD-10-CM | POA: Diagnosis not present

## 2015-08-18 DIAGNOSIS — Z960 Presence of urogenital implants: Secondary | ICD-10-CM | POA: Diagnosis not present

## 2015-08-18 DIAGNOSIS — E785 Hyperlipidemia, unspecified: Secondary | ICD-10-CM | POA: Diagnosis not present

## 2015-08-18 DIAGNOSIS — Y92009 Unspecified place in unspecified non-institutional (private) residence as the place of occurrence of the external cause: Secondary | ICD-10-CM | POA: Insufficient documentation

## 2015-08-18 DIAGNOSIS — Z8781 Personal history of (healed) traumatic fracture: Secondary | ICD-10-CM

## 2015-08-18 DIAGNOSIS — W19XXXA Unspecified fall, initial encounter: Secondary | ICD-10-CM

## 2015-08-18 LAB — COMPREHENSIVE METABOLIC PANEL
ANION GAP: 8 (ref 5–15)
AST: 17 U/L (ref 15–41)
Albumin: 4.1 g/dL (ref 3.5–5.0)
Alkaline Phosphatase: 107 U/L (ref 38–126)
BUN: 23 mg/dL — ABNORMAL HIGH (ref 6–20)
CALCIUM: 9.2 mg/dL (ref 8.9–10.3)
CHLORIDE: 99 mmol/L — AB (ref 101–111)
CO2: 26 mmol/L (ref 22–32)
CREATININE: 1 mg/dL (ref 0.61–1.24)
Glucose, Bld: 115 mg/dL — ABNORMAL HIGH (ref 65–99)
Potassium: 4.5 mmol/L (ref 3.5–5.1)
Sodium: 133 mmol/L — ABNORMAL LOW (ref 135–145)
Total Bilirubin: 1.6 mg/dL — ABNORMAL HIGH (ref 0.3–1.2)
Total Protein: 7.8 g/dL (ref 6.5–8.1)

## 2015-08-18 LAB — URINALYSIS COMPLETE WITH MICROSCOPIC (ARMC ONLY)
BILIRUBIN URINE: NEGATIVE
Glucose, UA: NEGATIVE mg/dL
KETONES UR: NEGATIVE mg/dL
NITRITE: POSITIVE — AB
Protein, ur: NEGATIVE mg/dL
Specific Gravity, Urine: 1.009 (ref 1.005–1.030)
Squamous Epithelial / LPF: NONE SEEN
pH: 7 (ref 5.0–8.0)

## 2015-08-18 LAB — CBC WITH DIFFERENTIAL/PLATELET
BASOS PCT: 2 %
Basophils Absolute: 0.3 10*3/uL — ABNORMAL HIGH (ref 0–0.1)
EOS PCT: 1 %
Eosinophils Absolute: 0.1 10*3/uL (ref 0–0.7)
HCT: 41.7 % (ref 40.0–52.0)
Hemoglobin: 13.8 g/dL (ref 13.0–18.0)
LYMPHS ABS: 1 10*3/uL (ref 1.0–3.6)
Lymphocytes Relative: 7 %
MCH: 30.5 pg (ref 26.0–34.0)
MCHC: 33.2 g/dL (ref 32.0–36.0)
MCV: 92.1 fL (ref 80.0–100.0)
MONOS PCT: 7 %
Monocytes Absolute: 1 10*3/uL (ref 0.2–1.0)
NEUTROS ABS: 12.1 10*3/uL — AB (ref 1.4–6.5)
Neutrophils Relative %: 83 %
PLATELETS: UNDETERMINED 10*3/uL (ref 150–440)
RBC: 4.53 MIL/uL (ref 4.40–5.90)
RDW: 14.7 % — ABNORMAL HIGH (ref 11.5–14.5)
WBC: 14.5 10*3/uL — ABNORMAL HIGH (ref 3.8–10.6)

## 2015-08-18 LAB — TROPONIN I: Troponin I: 0.03 ng/mL (ref <0.03)

## 2015-08-18 MED ORDER — DEXTROSE 5 % IV SOLN
2.0000 g | Freq: Once | INTRAVENOUS | Status: AC
  Administered 2015-08-18: 2 g via INTRAVENOUS
  Filled 2015-08-18: qty 2

## 2015-08-18 MED ORDER — DEXTROSE 5 % IV SOLN: 1.0000 g | Freq: Once | INTRAVENOUS | Status: DC

## 2015-08-18 MED ORDER — CIPROFLOXACIN HCL 500 MG PO TABS: 500.0000 mg | ORAL_TABLET | Freq: Two times a day (BID) | ORAL | Status: AC

## 2015-08-18 NOTE — ED Provider Notes (Signed)
[facility] Emergency Department Provider Note        Time seen: ----------------------------------------- 7:35 AM on 08/18/2015 -----------------------------------------  Level. Caveat: Review of systems and history is limited by Parkinson's and dementia  I have reviewed the triage vital signs and the nursing notes.   HISTORY  Chief Complaint Fall    HPI Hunter Willis is a 75 y.o. male who presents to the ER after a fall from home. Patient slid out of his recliner according to EMS this morning. Patient was complaining of right hip pain to them. Patient has severe Parkinson's. Reported foul odor from his Foley catheter. It was changed less than 2 weeks ago. Patient is unable to give any review of systems or report.   Past Medical History  Diagnosis Date  . Allergy   . Hypertension   . GERD (gastroesophageal reflux disease)   . Hyperlipidemia   . Tremor   . BPH (benign prostatic hyperplasia)     prev followed by Dr. Evelene CroonWolff  . Renal stones   . Parkinson's disease (HCC)   . Edema     Patient Active Problem List   Diagnosis Date Noted  . Has a tremor 07/06/2015  . HLD (hyperlipidemia) 07/06/2015  . Fall 05/28/2015  . Carotid artery calcification 02/08/2015  . Constipation 02/08/2015  . Lower urinary tract symptoms (LUTS) 12/01/2014  . Pressure ulcer 11/25/2014  . Neck pain 10/14/2014  . Excessive salivation 10/21/2013  . Advance care planning 09/05/2013  . Closed hip fracture (HCC) 05/31/2013  . Femoral neck fracture (HCC) 05/30/2013  . BP (high blood pressure) 05/03/2013  . Blood glucose elevated 05/03/2013  . Acid reflux 05/03/2013  . Benign essential tremor 05/03/2013  . Hyperglycemia 08/10/2012  . Hyperlipidemia 05/31/2011  . Medicare annual wellness visit, subsequent 05/31/2011  . Back pain 01/04/2011  . Parkinson's disease (HCC) 05/21/2010  . UNSPECIFIED VITAMIN D DEFICIENCY 08/24/2008  . Essential hypertension 07/23/2006  .  Allergic rhinitis 07/23/2006  . EROSIVE ESOPHAGITIS 07/23/2006  . GERD 07/23/2006  . RENAL CALCULUS, RECURRENT 07/23/2006  . BPH (benign prostatic hyperplasia) 07/23/2006    Past Surgical History  Procedure Laterality Date  . Esophagogastroduodenoscopy  01/1998  . Cystoscopy tumor / condylomata w/ laser  01/26/2001  . Emg  06/23/2001    NCL LE'S wnl, Dr. Kemper Durielarke  . Lithotripsy  10/2003    kidney stone, Dr. Artis FlockWolfe  . Hip arthroplasty Left 05/31/2013    Procedure: LEFT HIP HEMIARTHROPLASTY;  Surgeon: Shelda PalMatthew D Olin, MD;  Location: [facility];  Service: Orthopedics;  Laterality: Left;  . Transurethral resection of prostate    . Prostate surgery  2000    TURP    Allergies Review of patient's allergies indicates no known allergies.  Social History Social History  Substance Use Topics  . Smoking status: Never Smoker   . Smokeless tobacco: None  . Alcohol Use: No    Review of Systems Unknown at this time  ____________________________________________   PHYSICAL EXAM:  VITAL SIGNS: ED Triage Vitals  Enc Vitals Group     BP 08/18/15 0720 157/80 mmHg     Pulse Rate 08/18/15 0720 70     Resp 08/18/15 0720 18     Temp 08/18/15 0720 98.8 F (37.1 C)     Temp Source 08/18/15 0720 Oral     SpO2 08/18/15 0720 98 %     Weight 08/18/15 0721 190 lb (86.183 kg)     Height 08/18/15 0720 6\' 1"  (1.854 m)  Head Cir --      Peak Flow --      Pain Score --      Pain Loc --      Pain Edu? --      Excl. in GC? --     Constitutional: Lethargic, disoriented. No acute distress. Eyes: Conjunctivae are normal. PERRL. Normal extraocular movements. ENT   Head: Normocephalic and atraumatic.   Nose: No congestion/rhinnorhea.   Mouth/Throat: Mucous membranes are moist.   Neck: No stridor. Severe kyphosis Cardiovascular: Normal rate, regular rhythm. No murmurs, rubs, or gallops. Respiratory: Normal respiratory effort without tachypnea nor retractions. Breath sounds are clear and  equal bilaterally. No wheezes/rales/rhonchi. Gastrointestinal: Soft and nontender. Normal bowel sounds Musculoskeletal: Limited range of motion of the extremities. No specific focal tenderness Neurologic:  Speech and language appears normal, muscle rigidity is noted Skin:  Skin is warm, dry and intact. No rash noted. ____________________________________________  EKG: Interpreted by me. Normal sinus rhythm with artifact, LVH, normal axis. No evidence of acute infarction.  ____________________________________________  ED COURSE:  Pertinent labs & imaging results that were available during my care of the patient were reviewed by me and considered in my medical decision making (see chart for details). Patient is in no acute distress, we will check basic labs and imaging. ____________________________________________    LABS (pertinent positives/negatives)  Labs Reviewed  CBC WITH DIFFERENTIAL/PLATELET - Abnormal; Notable for the following:    WBC 14.5 (*)    RDW 14.7 (*)    All other components within normal limits  COMPREHENSIVE METABOLIC PANEL - Abnormal; Notable for the following:    Sodium 133 (*)    Chloride 99 (*)    Glucose, Bld 115 (*)    BUN 23 (*)    ALT <5 (*)    Total Bilirubin 1.6 (*)    All other components within normal limits  URINALYSIS COMPLETEWITH MICROSCOPIC ([facility] ONLY) - Abnormal; Notable for the following:    Color, Urine YELLOW (*)    APPearance HAZY (*)    Hgb urine dipstick 2+ (*)    Nitrite POSITIVE (*)    Leukocytes, UA 3+ (*)    Bacteria, UA FEW (*)    All other components within normal limits  TROPONIN I - Abnormal; Notable for the following:    Troponin I 0.03 (*)    All other components within normal limits  URINE CULTURE    RADIOLOGY Images were viewed by me  Chest x-ray, pelvis x-ray IMPRESSION: Atrophy, chronic small vessel disease. No acute intracranial abnormality.  Degenerative disc and facet disease throughout the cervical  spine with chronic reversal of normal cervical lordosis. No acute bony Abnormality. IMPRESSION: Osseous demineralization and LEFT hip prosthesis without definite acute bony abnormalities.  Bowel gas inferior to the inferior RIGHT pubic ramus, question inguinal hernia. IMPRESSION: Enlargement of cardiac silhouette and bronchitic changes.  Aortic atherosclerosis. ____________________________________________  FINAL ASSESSMENT AND PLAN  Fall, cystitis, hip contusion  Plan: Patient with labs and imaging as dictated above. Patient presented to the ER after a relatively benign fall. He did have a UTI here. We changed his Foley catheter, sent a urine culture off of the new urine specimen. He was given IV Rocephin to treat the UTI. He'll be discharged with Cipro and encouraged close follow-up with his doctor.   Emily Filbert, MD   Note: This dictation was prepared with Dragon dictation. Any transcriptional errors that result from this process are unintentional   Emily Filbert, MD 08/18/15  0911 

## 2015-08-18 NOTE — ED Notes (Signed)
Pt in ct/ xray  

## 2015-08-18 NOTE — ED Notes (Signed)
Pt slid out of recliner per EMS. Was c/o right hip pain to them. Pt has parkinson and dementia. Foul odor from catheter per EMS; was changed beginning of this month.

## 2015-08-18 NOTE — Discharge Instructions (Signed)
Urinary Tract Infection Urinary tract infections (UTIs) can develop anywhere along your urinary tract. Your urinary tract is your body's drainage system for removing wastes and extra water. Your urinary tract includes two kidneys, two ureters, a bladder, and a urethra. Your kidneys are a pair of bean-shaped organs. Each kidney is about the size of your fist. They are located below your ribs, one on each side of your spine. CAUSES Infections are caused by microbes, which are microscopic organisms, including fungi, viruses, and bacteria. These organisms are so small that they can only be seen through a microscope. Bacteria are the microbes that most commonly cause UTIs. SYMPTOMS  Symptoms of UTIs may vary by age and gender of the patient and by the location of the infection. Symptoms in young women typically include a frequent and intense urge to urinate and a painful, burning feeling in the bladder or urethra during urination. Older women and men are more likely to be tired, shaky, and weak and have muscle aches and abdominal pain. A fever may mean the infection is in your kidneys. Other symptoms of a kidney infection include pain in your back or sides below the ribs, nausea, and vomiting. DIAGNOSIS To diagnose a UTI, your caregiver will ask you about your symptoms. Your caregiver will also ask you to provide a urine sample. The urine sample will be tested for bacteria and white blood cells. White blood cells are made by your body to help fight infection. TREATMENT  Typically, UTIs can be treated with medication. Because most UTIs are caused by a bacterial infection, they usually can be treated with the use of antibiotics. The choice of antibiotic and length of treatment depend on your symptoms and the type of bacteria causing your infection. HOME CARE INSTRUCTIONS  If you were prescribed antibiotics, take them exactly as your caregiver instructs you. Finish the medication even if you feel better after  you have only taken some of the medication.  Drink enough water and fluids to keep your urine clear or pale yellow.  Avoid caffeine, tea, and carbonated beverages. They tend to irritate your bladder.  Empty your bladder often. Avoid holding urine for long periods of time.  Empty your bladder before and after sexual intercourse.  After a bowel movement, women should cleanse from front to back. Use each tissue only once. SEEK MEDICAL CARE IF:   You have back pain.  You develop a fever.  Your symptoms do not begin to resolve within 3 days. SEEK IMMEDIATE MEDICAL CARE IF:   You have severe back pain or lower abdominal pain.  You develop chills.  You have nausea or vomiting.  You have continued burning or discomfort with urination. MAKE SURE YOU:   Understand these instructions.  Will watch your condition.  Will get help right away if you are not doing well or get worse.   This information is not intended to replace advice given to you by your health care provider. Make sure you discuss any questions you have with your health care provider.   Document Released: 10/31/2004 Document Revised: 10/12/2014 Document Reviewed: 03/01/2011 Elsevier Interactive Patient Education 2016 Elsevier Inc.  Iliac Crest Contusion An iliac crest contusion is a deep bruise of your hip bone (hip pointer). Contusions are the result of an injury that caused bleeding under the skin. The contusion may turn blue, purple, or yellow. Minor injuries will give you a painless contusion, but more severe contusions may stay painful and swollen for a few weeks.  CAUSES  An iliac crest contusion is usually caused by a blow to the top of your hip pointer. The injury most often comes from contact sports.  SYMPTOMS   Swelling and redness of the hip area.  Bruising of the hip area.  Tenderness or soreness of the hip. DIAGNOSIS  The diagnosis can be made by taking your history and doing a physical exam. You may  need an X-ray of your hip pointer to look for a broken bone (fracture). TREATMENT  Often, the best treatment for an iliac crest contusion is resting, icing, elevating, and applying cold compresses to the injured area. Over-the-counter medicines may also be recommended for pain control. Crutches may be used if walking is very painful. Some people need physical therapy to help with range of motion and strengthening.  HOME CARE INSTRUCTIONS   Put ice on the injured area.  Put ice in a plastic bag.  Place a towel between your skin and the bag.  Leave the ice on for 15-20 minutes, 03-04 times a day.  Only take over-the-counter or prescription medicines for pain, discomfort, or fever as directed by your caregiver. Your caregiver may recommend avoiding anti-inflammatory medicines (aspirin, ibuprofen, and naproxen) for 48 hours because these medicines may increase bruising.  Keep your leg straightened (extended) when possible.  Walk or move around as the pain allows, or as directed by your caregiver. Use crutches if your caregiver recommends them.  Apply compression wraps as directed by your caregiver. You may remove the wraps for sleeping, showers, and baths. SEEK IMMEDIATE MEDICAL CARE IF:   You have increased bruising or swelling.  You have pain that is getting worse.  Your swelling or pain is not relieved with medicines.  Your toes get cold. MAKE SURE YOU:   Understand these instructions.  Will watch your condition.  Will get help right away if you are not doing well or get worse.   This information is not intended to replace advice given to you by your health care provider. Make sure you discuss any questions you have with your health care provider.   Document Released: 10/16/2000 Document Revised: 04/15/2011 Document Reviewed: 06/08/2014 Elsevier Interactive Patient Education [employer] Elsevier Inc.

## 2015-08-21 LAB — URINE CULTURE: SPECIAL REQUESTS: NORMAL

## 2015-08-24 ENCOUNTER — Telehealth: Payer: Self-pay

## 2015-08-24 NOTE — Telephone Encounter (Signed)
[name] from [facility] advised.

## 2015-08-24 NOTE — Telephone Encounter (Signed)
Please give the order.  Thanks.   

## 2015-08-24 NOTE — Telephone Encounter (Signed)
Cala BradfordKimberly PT with [facility] left v/m requesting verbal order for home health social worker ASAP.

## 2015-08-25 ENCOUNTER — Telehealth: Payer: Self-pay

## 2015-08-25 DIAGNOSIS — G20 Parkinson's disease: Secondary | ICD-10-CM

## 2015-08-25 DIAGNOSIS — R627 Adult failure to thrive: Secondary | ICD-10-CM

## 2015-08-25 NOTE — Telephone Encounter (Signed)
I'm going to use Parkinsons and adult failure to thrive for the order-please update me if that is not correct  [facility] done [facility]

## 2015-08-25 NOTE — Telephone Encounter (Signed)
[facility] called here to confirm Hospice order was placed. I told Amy to use Dr Para Marchuncan as the Primary Dr as he will return on Monday 08/28/15.

## 2015-08-25 NOTE — Telephone Encounter (Signed)
Maryruth HancockAnn Marie notified as instructed. Maryruth HancockAnn Marie voiced understanding and has [facility]. [facility] said she would contact Amy at hospice for the order.

## 2015-08-25 NOTE — Telephone Encounter (Signed)
Hunter HancockAnn Willis Child psychotherapistsocial worker with [facility] left v/m; pt condition has significantly deteriorated; pt cannot stand or walk; pt is not eating.pt also having hallucinations and seems agitated. Family wants to keep pt at home with hospice referral. Hunter Hancocknn Willis knows that Dr Para Marchuncan is not in office and hopes doctor in office would agree to hospice evaluation. Hunter Hancocknn Willis said she is willing to contact hospice if can get order. Hunter HancockAnn Willis request cb today.

## 2015-08-25 NOTE — Telephone Encounter (Signed)
Yes, thanks to all involved.  Hospice sounds reasonable.   Please list me as the primary MD on the hospice notes.  Thanks.

## 2015-09-04 ENCOUNTER — Ambulatory Visit: Payer: Medicare Other

## 2015-09-08 ENCOUNTER — Other Ambulatory Visit: Payer: Self-pay

## 2015-09-08 MED ORDER — FENTANYL 12 MCG/HR TD PT72: 12.5000 ug | MEDICATED_PATCH | TRANSDERMAL | 0 refills | Status: DC

## 2015-09-08 NOTE — Telephone Encounter (Signed)
Caller advised.  Rx left at front desk for pick up.

## 2015-09-08 NOTE — Telephone Encounter (Signed)
V/M left requesting rx for Fentanyl patch (last printed #10 on 08/09/15.) pt last seen 05/26/15. [facility]. Not sure if pt has been approved for Hospice yet.

## 2015-09-08 NOTE — Telephone Encounter (Signed)
Printed.  Thanks.  

## 2015-09-19 ENCOUNTER — Telehealth: Payer: Self-pay

## 2015-09-19 MED ORDER — OXYCODONE HCL 5 MG PO TABS: 2.5000 mg | ORAL_TABLET | ORAL | 0 refills | Status: DC | PRN

## 2015-09-19 NOTE — Telephone Encounter (Signed)
I would add on oxycodone as needed.  Printed.  If needed consistently, then we may need to inc the fentanyl.   I wouldn't inc fentanyl yet.  Thanks.

## 2015-09-19 NOTE — Telephone Encounter (Signed)
[name] RN with [facility] left v/m; pt is taking ibuprofen and fentanyl patch 12 mcg changing q 3 days. Pt having increased pain in neck area and shoulder. For several days having on and off sharp pain in neck and shoulder. [name] wants to know if can increase fentanyl patch or add on another opiod. [name] request cb.

## 2015-09-19 NOTE — Telephone Encounter (Signed)
Left detailed message on voicemail of Chiquita LothRenee Smith RN with [facility].  Rx faxed to pharmacy.

## 2015-09-22 ENCOUNTER — Other Ambulatory Visit: Payer: Self-pay | Admitting: Family Medicine

## 2015-09-29 ENCOUNTER — Telehealth: Payer: Self-pay | Admitting: Urology

## 2015-09-29 NOTE — Telephone Encounter (Signed)
Spoke with Mindi JunkerMarsha, with hospice, in reference to pt needing cath changed once a month. Mindi JunkerMarsha stated that hospice does not change catheters unless they are obstructed or leaking. Made Marsha aware [facility] protocol is to have cath changed monthly. Loletha GrayerGave Marsha verbal orders for cath to be changed once a month. Mindi JunkerMarsha voiced understanding.

## 2015-09-29 NOTE — Telephone Encounter (Signed)
Patient's daughter called today requesting an order for his foley exchange. He is currently in hospice and they will not change it without an order. Can we write up an order for this to be done every month. And we need to fax it them. I don't have the fax number but the nurse at the hospice house is Luster LandsbergRenee and her # is [phone]. We need to call her to get the fax#.  Thanks, Marcelino DusterMichelle

## 2015-09-29 NOTE — Telephone Encounter (Signed)
Receptionist at Hospice is going to have [name] call back.

## 2015-10-02 ENCOUNTER — Other Ambulatory Visit: Payer: Self-pay | Admitting: Family Medicine

## 2015-10-05 ENCOUNTER — Other Ambulatory Visit: Payer: Self-pay | Admitting: Family Medicine

## 2015-10-05 ENCOUNTER — Telehealth: Payer: Self-pay | Admitting: Family Medicine

## 2015-10-05 MED ORDER — FENTANYL 12 MCG/HR TD PT72: 12.5000 ug | MEDICATED_PATCH | TRANSDERMAL | 0 refills | Status: DC

## 2015-10-05 NOTE — Telephone Encounter (Signed)
I'm still the primary until the patient decides otherwise.   I'll work with hospice and they'll work with me.  rx printed for fentanyl patch.  Please fax in if needed, hospice patient.  Let me know what I can do to be of service.  Thanks.

## 2015-10-05 NOTE — Telephone Encounter (Signed)
[name] advised.  Lawson FiscalLori will pick up the Rx today.  Rx left at front desk for pick up.

## 2015-10-05 NOTE — Telephone Encounter (Signed)
Pt returned your call lori caretake Hospice has come in and took over Hospice told her that pt is their pt now  lori  Cannot get pt here for visits She doesnot want pt to go with out fintenyl patches Please advise what she needs to do  Best number [phone]  Hospice number [phone]

## 2015-10-13 ENCOUNTER — Other Ambulatory Visit: Payer: Self-pay | Admitting: Family Medicine

## 2015-10-13 NOTE — Telephone Encounter (Signed)
Electronic refill request. Last Filled:    120 tablet 2 07/14/2015  Please advise.

## 2015-10-15 NOTE — Telephone Encounter (Signed)
Sent. Thanks.   

## 2015-10-24 ENCOUNTER — Other Ambulatory Visit: Payer: Self-pay | Admitting: Family Medicine

## 2015-11-06 ENCOUNTER — Other Ambulatory Visit: Payer: Self-pay

## 2015-11-06 NOTE — Telephone Encounter (Signed)
Pt requesting refill, I don't see that we filled it before. Last OV 05/26/15 Ok to refill?

## 2015-11-07 NOTE — Telephone Encounter (Signed)
Verify with pharmacy or hospice that he has been on med and if so then send in #30 with 2 rf.  Thanks.

## 2015-11-07 NOTE — Telephone Encounter (Signed)
Pharmacy says it was prescribed by another MD and the patient has refills remaining on that original Rx.  Pharmacist asks that we disregard the request.

## 2015-11-13 ENCOUNTER — Other Ambulatory Visit: Payer: Self-pay

## 2015-11-13 MED ORDER — FENTANYL 12 MCG/HR TD PT72: 12.5000 ug | MEDICATED_PATCH | TRANSDERMAL | 0 refills | Status: DC

## 2015-11-13 NOTE — Telephone Encounter (Signed)
Rx faxed to [facility] 

## 2015-11-13 NOTE — Telephone Encounter (Signed)
V/M left requesting rx fentanyl patch; pt is out of med. Last printed #10 on 10/05/15. Last seen acute visit 05/26/15.

## 2015-11-13 NOTE — Telephone Encounter (Signed)
Hunter FiscalLori, caretaker called to get patch.  Explained has been faxed in/ lt

## 2015-11-13 NOTE — Telephone Encounter (Signed)
Printed, okay to fax in.  Hospice patient.  Thanks.

## 2015-11-14 ENCOUNTER — Telehealth: Payer: Self-pay | Admitting: Family Medicine

## 2015-11-14 MED ORDER — CIPROFLOXACIN HCL 500 MG PO TABS: 500.0000 mg | ORAL_TABLET | Freq: Two times a day (BID) | ORAL | 0 refills | Status: DC

## 2015-11-14 NOTE — Telephone Encounter (Signed)
Left detailed message on voicemail of RN at [phone].  Wife advised of medication sent to pharmacy.

## 2015-11-14 NOTE — Telephone Encounter (Signed)
[facility] called -  Pt had a call 911 disposition, then they told the th nurse that pt was on hospice.  Pt would like antibiotic. Wife requesting call back , thanks

## 2015-11-14 NOTE — Telephone Encounter (Signed)
Patient Name: [name] DOB: 09/17/1940 Initial Comment Caller says, has a catheter, cloudy urine looks like coffee with cream for a week, blood in urine, pain, and hard to walk. Nurse Assessment Nurse: Leveda AnnaHensel, RN, Aeriel Date/Time Hunter Willis(Eastern Time): 11/14/2015 11:50:34 AM Confirm and document reason for call. If symptomatic, describe symptoms. You must click the next button to save text entered. ---Caller says, has a catheter, cloudy urine looks like coffee with cream for a week, blood in urine, pain, and hard to walk. No fever. Has the patient traveled out of the country within the last 30 days? ---Not Applicable Does the patient have any new or worsening symptoms? ---Yes Will a triage be completed? ---Yes Related visit to physician within the last 2 weeks? ---No Does the PT have any chronic conditions? (i.e. diabetes, asthma, etc.) ---Yes List chronic conditions. ---parkinsons Is this a behavioral health or substance abuse call? ---No Guidelines Guideline Title Affirmed Question Affirmed Notes Urine - Blood In Shock suspected (e.g., cold/pale/clammy skin, too weak to stand, low BP, rapid pulse) Final Disposition User Call EMS 911 Now Hensel, RN, Aeriel Comments Caller refused 911 outcome. Caller states, pt is a hospice pt. Nurse informed caller to call hospice nurse. Caller states, she already has contacted the hospice nurse and would like an antibiotic. Nurse informed caller she would make a note in the chart and have someone call back. Nurse contacting the office in relation to the fact that pt refused 911 outcome and requesting to speak with someone instead in regards to the abx.

## 2015-11-14 NOTE — Telephone Encounter (Signed)
Agreed, would start cipro.   rx sent.   Have them change the catheter if possible/tolerable for patient.   App help off all involved.  Please call wife back.

## 2015-11-14 NOTE — Telephone Encounter (Signed)
I spoke with Mrs Hunter Willis and hospice nurse saw pt earlier today and was to contact Dr Armanda Heritageuncans office. Hospice has not called but Mrs Hunter Willis [facility]. Mr Hunter Willis request cb.

## 2015-11-15 ENCOUNTER — Emergency Department
Admission: EM | Admit: 2015-11-15 | Discharge: 2015-11-15 | Disposition: A | Discharge status: Home / Self Care | Attending: Emergency Medicine | Admitting: Emergency Medicine

## 2015-11-15 DIAGNOSIS — T83098A Other mechanical complication of other indwelling urethral catheter, initial encounter: Secondary | ICD-10-CM | POA: Diagnosis not present

## 2015-11-15 DIAGNOSIS — I10 Essential (primary) hypertension: Secondary | ICD-10-CM | POA: Diagnosis not present

## 2015-11-15 DIAGNOSIS — Z79899 Other long term (current) drug therapy: Secondary | ICD-10-CM | POA: Diagnosis not present

## 2015-11-15 DIAGNOSIS — G20 Parkinson's disease: Secondary | ICD-10-CM | POA: Diagnosis not present

## 2015-11-15 DIAGNOSIS — T839XXA Unspecified complication of genitourinary prosthetic device, implant and graft, initial encounter: Secondary | ICD-10-CM

## 2015-11-15 DIAGNOSIS — Y828 Other medical devices associated with adverse incidents: Secondary | ICD-10-CM | POA: Insufficient documentation

## 2015-11-15 NOTE — ED Notes (Signed)
Pt wife called who gave number for caregiver of patient that will be picking patient up. Caregiver called, states she will come pick up patient.

## 2015-11-15 NOTE — ED Triage Notes (Signed)
Pt arrives to ER via [facility] from home after catheter was accidentally pulled out. Pt bleeding at site of penis PTA, ceased at this time. Pt wears urinary catheter long term. Pt alert and oriented X4, active, cooperative, pt in NAD. RR even and unlabored, color WNL.

## 2015-11-15 NOTE — ED Notes (Addendum)
Pt informed to hit call bell if he needs any assistance. Informed that he is waiting on MD assessment. Lights dimmed and TV on per patient preference.  Hospice nurse called to inform this RN that pt was supposed to start Cipro PO BID today for UTI.

## 2015-11-15 NOTE — ED Notes (Signed)
MD at bedside. 

## 2015-11-15 NOTE — Discharge Instructions (Signed)
Please seek medical attention for any high fevers, chest pain, shortness of breath, change in behavior, persistent vomiting, bloody stool or any other new or concerning symptoms.  

## 2015-11-15 NOTE — ED Notes (Addendum)
Pt updated on plan of care and that his caregiver will be picking him up to take him home. Given diet coke, denies wanting a snack.   On phone with wife who states that patient does not use leg bag for catheter output, but rather large bag.

## 2015-11-15 NOTE — ED Provider Notes (Signed)
[facility] Emergency Department Provider Note    ____________________________________________   I have reviewed the triage vital signs and the nursing notes.   HISTORY  Chief Complaint Other (catheter problem)   History limited by: Not Limited   HPI Hunter Willis is a 75 y.o. male who presents to the emergency department today after his Foley catheter was inadvertently pulled out. Patient states that he did have some pain when this happened however that is getting better." He did notice some blood at his meatus. He states that he has had a Foley catheter in for a long time. Per report he apparently is scheduled to start Cipro twice a day today for a urinary tract infection.    Past Medical History:  Diagnosis Date  . Allergy   . BPH (benign prostatic hyperplasia)    prev followed by Dr. Evelene Croon  . Edema   . GERD (gastroesophageal reflux disease)   . Hyperlipidemia   . Hypertension   . Parkinson's disease (HCC)   . Renal stones   . Tremor     Patient Active Problem List   Diagnosis Date Noted  . FTT (failure to thrive) in adult 08/25/2015  . Has a tremor 07/06/2015  . HLD (hyperlipidemia) 07/06/2015  . Fall 05/28/2015  . Carotid artery calcification 02/08/2015  . Constipation 02/08/2015  . Lower urinary tract symptoms (LUTS) 12/01/2014  . Pressure ulcer 11/25/2014  . Neck pain 10/14/2014  . Excessive salivation 10/21/2013  . Advance care planning 09/05/2013  . Closed hip fracture (HCC) 05/31/2013  . Femoral neck fracture (HCC) 05/30/2013  . BP (high blood pressure) 05/03/2013  . Blood glucose elevated 05/03/2013  . Acid reflux 05/03/2013  . Benign essential tremor 05/03/2013  . Hyperglycemia 08/10/2012  . Hyperlipidemia 05/31/2011  . Medicare annual wellness visit, subsequent 05/31/2011  . Back pain 01/04/2011  . Parkinson's disease (HCC) 05/21/2010  . UNSPECIFIED VITAMIN D DEFICIENCY 08/24/2008  . Essential hypertension 07/23/2006   . Allergic rhinitis 07/23/2006  . EROSIVE ESOPHAGITIS 07/23/2006  . GERD 07/23/2006  . RENAL CALCULUS, RECURRENT 07/23/2006  . BPH (benign prostatic hyperplasia) 07/23/2006    Past Surgical History:  Procedure Laterality Date  . CYSTOSCOPY TUMOR / CONDYLOMATA W/ LASER  01/26/2001  . EMG  06/23/2001   NCL LE'S wnl, Dr. Kemper Durie  . ESOPHAGOGASTRODUODENOSCOPY  01/1998  . HIP ARTHROPLASTY Left 05/31/2013   Procedure: LEFT HIP HEMIARTHROPLASTY;  Surgeon: Shelda Pal, MD;  Location: [facility];  Service: Orthopedics;  Laterality: Left;  . LITHOTRIPSY  10/2003   kidney stone, Dr. Artis Flock  . PROSTATE SURGERY  2000   TURP  . TRANSURETHRAL RESECTION OF PROSTATE      Prior to Admission medications   Medication Sig Start Date End Date Taking? Authorizing Provider  Acetaminophen 500 MG coapsule Take by mouth.    Historical Provider, MD  atenolol (TENORMIN) 25 MG tablet TAKE ONE TABLET BY MOUTH TWO TIMES A DAY 10/02/15   Joaquim Nam, MD  benztropine (COGENTIN) 0.5 MG tablet TAKE 2 TABLETS BY MOUTH 2 TIMES DAILY 10/15/15   Joaquim Nam, MD  Carbidopa 25 MG tablet Take 25 mg by mouth 3 (three) times daily. Reported on 07/06/2015    Historical Provider, MD  carbidopa-levodopa (SINEMET IR) 25-100 MG tablet Take 1.5 tablets by mouth 3 (three) times daily. 12/01/14   Joaquim Nam, MD  ciprofloxacin (CIPRO) 500 MG tablet Take 1 tablet (500 mg total) by mouth 2 (two) times daily. 11/14/15   Cheree Ditto  Lianne BushyS Duncan, MD  docusate sodium (COLACE) 100 MG capsule Take 100 mg by mouth daily.    Historical Provider, MD  donepezil (ARICEPT) 5 MG tablet Take 5 mg by mouth at bedtime.    Historical Provider, MD  fentaNYL (DURAGESIC - DOSED MCG/HR) 12 MCG/HR Place 1 patch (12.5 mcg total) onto the skin every 3 (three) days. Hospice patient 11/13/15   Joaquim NamGraham S Duncan, MD  finasteride (PROSCAR) 5 MG tablet Take 1 tablet (5 mg total) by mouth daily. 01/19/15   Joaquim NamGraham S Duncan, MD  fluticasone (FLONASE) 50 MCG/ACT nasal spray  Place 2 sprays into both nostrils daily. 05/26/15   Joaquim NamGraham S Duncan, MD  furosemide (LASIX) 20 MG tablet TAKE 1 TABLET BY MOUTH ONCE A DAY 10/02/15   Joaquim NamGraham S Duncan, MD  glycopyrrolate (ROBINUL) 1 MG tablet Take 1 mg by mouth 2 (two) times daily.    Historical Provider, MD  ibuprofen (ADVIL,MOTRIN) 200 MG tablet Take 400 mg by mouth every 6 (six) hours as needed for headache or mild pain.    Historical Provider, MD  loperamide (IMODIUM A-D) 2 MG tablet Take 2 mg by mouth 4 (four) times daily as needed for diarrhea or loose stools.    Historical Provider, MD  magnesium gluconate (MAGONATE) 500 MG tablet Take 500 mg by mouth 2 (two) times daily.    Historical Provider, MD  Melatonin 3 MG TABS Take 3 mg by mouth at bedtime.    Historical Provider, MD  omeprazole (PRILOSEC) 20 MG capsule TAKE 1 CAPSULE BY MOUTH ONCE DAILY 10/24/15   Joaquim NamGraham S Duncan, MD  OVER THE COUNTER MEDICATION Take 2 tablets by mouth at bedtime. Pt takes [employer]--Restful Legs.    Historical Provider, MD  oxyCODONE (ROXICODONE) 5 MG immediate release tablet Take 0.5-1 tablets (2.5-5 mg total) by mouth every 4 (four) hours as needed for severe pain or breakthrough pain. Hospice patient. 09/19/15   Joaquim NamGraham S Duncan, MD  potassium chloride (K-DUR) 10 MEQ tablet TAKE 1 TABLET BY MOUTH ONCE A DAY. TAKE WITH FUROSEMIDE (LASIX). 10/02/15   Joaquim NamGraham S Duncan, MD  pramipexole (MIRAPEX) 0.5 MG tablet Take 0.5 mg by mouth at bedtime.    Historical Provider, MD  promethazine (PHENERGAN) 25 MG tablet Take by mouth.    Historical Provider, MD  QUEtiapine (SEROQUEL) 50 MG tablet Take 1 tablet (50 mg total) by mouth at bedtime. 05/26/15   Joaquim NamGraham S Duncan, MD  Rotigotine (NEUPRO) 8 MG/24HR PT24 Place 1 patch onto the skin daily.     Historical Provider, MD  tamsulosin (FLOMAX) 0.4 MG CAPS capsule TAKE 2 CAPSULES BY MOUTH DAILY 09/22/15   Joaquim NamGraham S Duncan, MD    Allergies Review of patient's allergies indicates no known allergies.  Family History  Problem  Relation Age of Onset  . Diabetes Mother   . Diabetes Sister   . Cancer Brother     prostate, terminal  . Prostate cancer Brother   . Hypertension Brother   . Hypertension Brother   . Hypertension Brother   . Depression Sister   . Cancer Sister     throat  . Heart disease Father   . Heart disease Brother   . Colon cancer Neg Hx     Social History Social History  Substance Use Topics  . Smoking status: Never Smoker  . Smokeless tobacco: Not on file  . Alcohol use No    Review of Systems  Constitutional: Negative for fever. Cardiovascular: Negative for chest pain. Respiratory: Negative for  shortness of breath. Gastrointestinal: Negative for abdominal pain, vomiting and diarrhea. Genitourinary: Positive for some discomfort and bleeding at the meatus.  Neurological: Negative for headaches, focal weakness or numbness.  10-point ROS otherwise negative.  ____________________________________________   PHYSICAL EXAM:  VITAL SIGNS: ED Triage Vitals [11/15/15 1426]  Enc Vitals Group     BP 119/67     Pulse Rate 71     Resp 18     Temp 97.1 F (36.2 C)     Temp Source Oral     SpO2 95 %     Weight 209 lb 12.8 oz (95.2 kg)   Constitutional: Alert and oriented. Well appearing and in no distress. Eyes: Conjunctivae are normal. Normal extraocular movements. ENT   Head: Normocephalic and atraumatic.   Nose: No congestion/rhinnorhea.   Mouth/Throat: Mucous membranes are moist.   Neck: No stridor. Hematological/Lymphatic/Immunilogical: No cervical lymphadenopathy. Cardiovascular: Normal rate, regular rhythm.  No murmurs, rubs, or gallops. Respiratory: Normal respiratory effort without tachypnea nor retractions. Breath sounds are clear and equal bilaterally. No wheezes/rales/rhonchi. Gastrointestinal: Soft and nontender. No distention.  Genitourinary: Small amount of blood noted on diaper, no active bleeding from meatus.  Musculoskeletal: Normal range of  motion in all extremities. No lower extremity edema. Neurologic:  Normal speech and language. No gross focal neurologic deficits are appreciated.  Skin:  Skin is warm, dry and intact. No rash noted. Psychiatric: Mood and affect are normal. Speech and behavior are normal. Patient exhibits appropriate insight and judgment.  ____________________________________________    LABS (pertinent positives/negatives)  None  ____________________________________________   EKG  None  ____________________________________________    RADIOLOGY  None  ____________________________________________   PROCEDURES  Procedures  ____________________________________________   INITIAL IMPRESSION / ASSESSMENT AND PLAN / ED COURSE  Pertinent labs & imaging results that were available during my care of the patient were reviewed by me and considered in my medical decision making (see chart for details).  Foley catheter was able to be replaced. No active bleeding on my exam. Will have patient follow up with urology. ____________________________________________   FINAL CLINICAL IMPRESSION(S) / ED DIAGNOSES  Final diagnoses:  Problem with Foley catheter, initial encounter [facility])     Note: This dictation was prepared with Dragon dictation. Any transcriptional errors that result from this process are unintentional    Phineas Semen, MD 11/15/15 1645

## 2015-11-15 NOTE — ED Notes (Signed)
Pt sent home with his caregiver Lorrie. Pt assisted into car via 2 person assist and wheelchair.

## 2015-11-17 ENCOUNTER — Telehealth: Payer: Self-pay

## 2015-11-17 NOTE — Telephone Encounter (Signed)
I spoke with pts caretaker and this note was on todays portal from 11/14/15. Pt was seen at ED on 11/15/15 is on an abx and got new cath put in.,

## 2015-11-17 NOTE — Telephone Encounter (Signed)
PLEASE NOTE: All timestamps contained within this report are represented as [place] Standard Time. CONFIDENTIALTY NOTICE: This fax transmission is intended only for the addressee. It contains information that is legally privileged, confidential or otherwise protected from use or disclosure. If you are not the intended recipient, you are strictly prohibited from reviewing, disclosing, copying using or disseminating any of this information or taking any action in reliance on or regarding this information. If you have received this fax in error, please notify us immediately by telephone so that we can arrange for its return to us. Phone: [phone], Toll-Free: [phone], Fax: [phone] Page: 1 of 2 Call Id: [id number] [facility] - Client TELEPHONE ADVICE RECORD [facility] Patient Name: Hunter Willis Gender: Male DOB: 04/13/1940 Age: 89 Y 2 M 11 D Return Phone Number: [phone] (Primary), [phone] (Secondary) Address: City[place]State[place]: [place] [id number] Client Door [facility] - Client Client Site  [facility] - Day Physician Raechel Acheuncan, Shaw - MD Contact Type Call Who Is Calling Patient / Member / Family / Caregiver Call Type Triage / Clinical Caller Name Otho BellowsKay Mckeever Relationship To Patient Spouse Return Phone Number [phone] (Secondary) Chief Complaint Urine - unusual color Reason for Call Symptomatic / Request for Health Information Initial Comment Caller says, has a catheter, cloudy urine looks like coffee with cream for a week, blood in urine, pain, and hard to walk. Appointment Disposition EMR Appointment Not Necessary Info pasted into Epic Yes PreDisposition Call Doctor Translation No Nurse Assessment Nurse: Leveda AnnaHensel, RN, Aeriel Date/Time Lamount Cohen(Eastern Time): 11/14/2015 11:50:34 AM Confirm and document reason for call. If symptomatic, describe symptoms. You must click the next button  to save text entered. ---Caller says, has a catheter, cloudy urine looks like coffee with cream for a week, blood in urine, pain, and hard to walk. No fever. Has the patient traveled out of the country within the last 30 days? ---Not Applicable Does the patient have any new or worsening symptoms? ---Yes Will a triage be completed? ---Yes Related visit to physician within the last 2 weeks? ---No Does the PT have any chronic conditions? (i.e. diabetes, asthma, etc.) ---Yes List chronic conditions. ---parkinsons Is this a behavioral health or substance abuse call? ---No Guidelines Guideline Title Affirmed Question Affirmed Notes Nurse Date/Time (Eastern Time) Urine - Blood In Shock suspected (e.g., cold/pale/clammy skin, too weak to stand, low BP, rapid pulse) Hensel, RN, Aeriel 11/14/2015 11:53:45 AM PLEASE NOTE: All timestamps contained within this report are represented as [place] Standard Time. CONFIDENTIALTY NOTICE: This fax transmission is intended only for the addressee. It contains information that is legally privileged, confidential or otherwise protected from use or disclosure. If you are not the intended recipient, you are strictly prohibited from reviewing, disclosing, copying using or disseminating any of this information or taking any action in reliance on or regarding this information. If you have received this fax in error, please notify us immediately by telephone so that we can arrange for its return to us. Phone: [phone], Toll-Free: [phone], Fax: [phone] Page: 2 of 2 Call Id: [id number] Disp. Time Lamount Cohen(Eastern Time) Disposition Final User 11/14/2015 12:06:10 PM 911 Outcome Documentation Hensel, RN, Aeriel Reason: Caller refused 911 outcome pt is a hospice pt. 11/14/2015 12:05:36 PM Call EMS 911 Now Yes Hensel, RN, Aeriel Caller Understands: Yes Disagree/Comply: Comply Care Advice Given Per Guideline CALL EMS 911 NOW: Immediate medical attention is  needed. You need to hang up and call 911 (or an ambulance). (  Triager Discretion: I'll call you back in a few minutes to be sure you were able to reach them.) FIRST AID: Lie down with the feet elevated (Reason: counteract shock). CARE ADVICE given per Urine, Blood In (Adult) guideline. Comments User: Mahlon Gammon, RN Date/Time Lamount Cohen Time): 11/14/2015 12:05:19 PM Caller refused 911 outcome. Caller states, pt is a hospice pt. Nurse informed caller to call hospice nurse. Caller states, she already has contacted the hospice nurse and would like an antibiotic. Nurse informed caller she would make a note in the chart and have someone call back. Nurse contacting the office in relation to the fact that pt refused 911 outcome and requesting to speak with someone instead in regards to the abx.

## 2015-11-17 NOTE — Telephone Encounter (Signed)
This [facility] note did not come to my desktop; found on [facility] portal. Sending note to Dr Para Marchuncan and Antonieta LovelessJason Murphy RN.

## 2015-11-19 NOTE — Telephone Encounter (Signed)
Please get update on patient Monday.  Thanks. 

## 2015-11-20 NOTE — Telephone Encounter (Signed)
If I need to give a specific order for a specific catheter, then please let me know. Thanks.

## 2015-11-20 NOTE — Telephone Encounter (Signed)
Wife advised. 

## 2015-11-20 NOTE — Telephone Encounter (Signed)
Caregiver says that patient is very weak but the problems with his urine have cleared completely.  Caregiver says that they placed a new catheter in the hospital and his urine cleared up and he has felt some better.  Caregiver says that she questioned it with [facility] and that [facility] said she felt that their catheters were not of the best quality.  Caregiver asked if she could request that brand not be used on the patient again and she stated she would do what she could.  Patient is in bed most of the time or the  wheelchair because he cannot walk well or stand for very long.

## 2015-11-21 ENCOUNTER — Telehealth: Payer: Self-pay | Admitting: Family Medicine

## 2015-11-21 NOTE — Telephone Encounter (Signed)
Noted. Thanks for update, though I hate to hear about his decline.

## 2015-11-21 NOTE — Telephone Encounter (Signed)
Hospice called Pt has had significant decline over last few day, no longer able to get up and feed himself.   No call back needed

## 2015-12-06 ENCOUNTER — Other Ambulatory Visit: Payer: Self-pay | Admitting: Family Medicine

## 2015-12-08 ENCOUNTER — Other Ambulatory Visit: Payer: Self-pay

## 2015-12-08 MED ORDER — OXYCODONE HCL 5 MG PO TABS: 2.5000 mg | ORAL_TABLET | ORAL | 0 refills | Status: DC | PRN

## 2015-12-08 NOTE — Telephone Encounter (Signed)
Spoke to [place] Claire[place]aurie. [facility]

## 2015-12-08 NOTE — Telephone Encounter (Signed)
Jacki ConesLaurie pts caretaker request refill [facility]ycodone [facility]; hosp[facility] pt; pt has 2 pills left.  Last printed # 30 on 09/19/15. Last seen 05/26/15.

## 2015-12-08 NOTE — Telephone Encounter (Signed)
Printed.  Thanks.  

## 2015-12-11 ENCOUNTER — Other Ambulatory Visit: Payer: Self-pay | Admitting: *Deleted

## 2015-12-11 NOTE — Telephone Encounter (Signed)
Faxed refill request. Last Filled:     10 patch 0 11/13/2015  Please advise.

## 2015-12-12 MED ORDER — FENTANYL 12 MCG/HR TD PT72: 12.5000 ug | MEDICATED_PATCH | TRANSDERMAL | 0 refills | Status: DC

## 2015-12-12 NOTE — Telephone Encounter (Signed)
Printed. Thanks.  Can fax in.

## 2015-12-12 NOTE — Telephone Encounter (Signed)
[facility]axed to [facility].  Hospice Pt.

## 2016-01-04 ENCOUNTER — Other Ambulatory Visit: Payer: Self-pay | Admitting: *Deleted

## 2016-01-04 NOTE — Telephone Encounter (Signed)
Faxed refill request. Last Filled:    30 tablet 0 12/08/2015  Please advise.

## 2016-01-05 MED ORDER — OXYCODONE HCL 5 MG PO TABS: 2.5000 mg | ORAL_TABLET | ORAL | 0 refills | Status: DC | PRN

## 2016-01-05 NOTE — Telephone Encounter (Signed)
Okay to fax in.  Hospice patient.  Printed.  Thanks.

## 2016-01-05 NOTE — Telephone Encounter (Signed)
Faxed to [facility] 

## 2016-01-10 ENCOUNTER — Other Ambulatory Visit: Payer: Self-pay | Admitting: *Deleted

## 2016-01-10 NOTE — Telephone Encounter (Signed)
Received faxed refill request Last refill 12/12/15 #10 Last office visit 08/07/15

## 2016-01-11 MED ORDER — FENTANYL 12 MCG/HR TD PT72: 12.5000 ug | MEDICATED_PATCH | TRANSDERMAL | 0 refills | Status: DC

## 2016-01-11 NOTE — Telephone Encounter (Signed)
Faxed to [facility] 

## 2016-01-11 NOTE — Telephone Encounter (Signed)
Printed.  Thanks.  

## 2016-01-13 ENCOUNTER — Other Ambulatory Visit: Payer: Self-pay | Admitting: Family Medicine

## 2016-01-19 ENCOUNTER — Other Ambulatory Visit: Payer: Self-pay | Admitting: Family Medicine

## 2016-01-23 ENCOUNTER — Telehealth: Payer: Self-pay

## 2016-01-23 NOTE — Telephone Encounter (Signed)
Hunter IgoJan Willis, nursing director with [facility] left v/m; pt is presently out of flomax and pt has foley cath that is working well. Pt is currently on flomax and hospice doctors do not usually continue the flomax when has foley. Hunter request cb if Dr Para Marchuncan wants flomax continued. [facility].

## 2016-01-23 NOTE — Telephone Encounter (Signed)
Okay to stop flomax, please update med list.  Thanks.

## 2016-01-23 NOTE — Addendum Note (Signed)
Addended by: Annamarie MajorFUQUAY, Jamicheal Heard S on: 01/23/2016 03:14 PM   Modules accepted: Orders

## 2016-01-23 NOTE — Telephone Encounter (Signed)
Left detailed message on voicemail.  

## 2016-02-13 ENCOUNTER — Other Ambulatory Visit: Payer: Self-pay | Admitting: Family Medicine

## 2016-02-13 DIAGNOSIS — Z515 Encounter for palliative care: Secondary | ICD-10-CM | POA: Insufficient documentation

## 2016-02-13 NOTE — Telephone Encounter (Signed)
Electronic refill request. Last Filled:    10 patch 0 01/11/2016  Please advise.

## 2016-02-13 NOTE — Telephone Encounter (Signed)
Faxed to [facility] 

## 2016-02-13 NOTE — Telephone Encounter (Signed)
Printed.  Thanks.  

## 2016-02-15 ENCOUNTER — Telehealth: Payer: Self-pay | Admitting: *Deleted

## 2016-02-15 NOTE — Telephone Encounter (Signed)
Fax received for PA for Atenolol 25 mg tablets.  Form in Dr. Lianne Bushyuncan's In Box.  Patient is a Hospice patient, ? Need for PA.

## 2016-02-15 NOTE — Telephone Encounter (Signed)
Shouldn't need a PA on hospice patient.  Is this an issue with atenolol availability?  Let me know.  Thanks.

## 2016-02-16 ENCOUNTER — Other Ambulatory Visit: Payer: Self-pay | Admitting: Family Medicine

## 2016-02-16 NOTE — Telephone Encounter (Signed)
Printed.  Thanks.  

## 2016-02-16 NOTE — Telephone Encounter (Signed)
Faxed to [facility] 

## 2016-02-16 NOTE — Telephone Encounter (Signed)
Electronic refill request. Last Filled:     30 tablet 0 01/05/2016  Please advise.

## 2016-02-26 NOTE — Telephone Encounter (Signed)
Left a message for Hospice nurse to call back to talk with her about the Atenolol. [phone]

## 2016-03-04 NOTE — Telephone Encounter (Signed)
[facility] and was advised that Dr. Para Marchuncan is correct that this medication should not need a prior authorization. [name] stated that they still have the medication on patient's medication list and to her knowledge has not had a problem getting the medication. [name] stated that if it becomes a problem she will work on it herself with the insurance company.

## 2016-03-04 NOTE — Telephone Encounter (Signed)
Left another message for hospice nurse Chiquita LothRenee Smith to call the office back.

## 2016-03-11 ENCOUNTER — Other Ambulatory Visit: Payer: Self-pay | Admitting: Family Medicine

## 2016-03-15 ENCOUNTER — Telehealth: Payer: Self-pay

## 2016-03-15 ENCOUNTER — Other Ambulatory Visit: Payer: Self-pay

## 2016-03-15 MED ORDER — FENTANYL 12 MCG/HR TD PT72: MEDICATED_PATCH | TRANSDERMAL | 0 refills | Status: DC

## 2016-03-15 NOTE — Telephone Encounter (Signed)
Marvene StaffLaurie Caretaker requesting complete medical history for VA Benefits. No one can come and sign because pt and pts wife are bedridden. Lyla SonCarrie [facility]. Jacki ConesLaurie voiced understanding and I gave Jacki ConesLaurie the fax # [facility].

## 2016-03-15 NOTE — Telephone Encounter (Signed)
Printed, please fax in.  Hospice patient.

## 2016-03-15 NOTE — Telephone Encounter (Signed)
[name] nurse with [facility] left v/m requesting rx Fentanyl patch sent to [facility]. Pt last seen 05/26/15. Last printed # 10 on 02/13/16.

## 2016-03-15 NOTE — Telephone Encounter (Signed)
RX FAXED TO [facility]

## 2016-03-16 ENCOUNTER — Other Ambulatory Visit: Payer: Self-pay | Admitting: Family Medicine

## 2016-03-20 ENCOUNTER — Other Ambulatory Visit: Payer: Self-pay | Admitting: Family Medicine

## 2016-03-20 NOTE — Telephone Encounter (Signed)
Rx faxed to pharmacy as instructed. 

## 2016-03-20 NOTE — Telephone Encounter (Signed)
Received refill electronically Last refill 02/16/16 #30 Last office visit 05/26/15

## 2016-03-20 NOTE — Telephone Encounter (Signed)
Reviewed [place] controlled substance registry, no suspicious activity. Hospice patient.

## 2016-04-11 ENCOUNTER — Other Ambulatory Visit: Payer: Self-pay | Admitting: Family Medicine

## 2016-04-11 NOTE — Telephone Encounter (Signed)
[facility]

## 2016-04-11 NOTE — Telephone Encounter (Signed)
Last Rx 03/15/2016. Last OV 05/2015

## 2016-04-11 NOTE — Telephone Encounter (Signed)
Printed.  Please fax in.  Hospice patient.  Thanks.

## 2016-04-17 ENCOUNTER — Other Ambulatory Visit: Payer: Self-pay | Admitting: Primary Care

## 2016-04-17 NOTE — Telephone Encounter (Signed)
Ok to refill? Electronically refill request for oxyCODONE (OXY IR/ROXICODONE) 5 MG immediate release tablet. #30 with no refills  Last prescribed by Jae DireKate on 03/20/2016. Hospice patient.

## 2016-04-18 NOTE — Telephone Encounter (Signed)
Printed, please fax in.  Thanks.  

## 2016-04-18 NOTE — Telephone Encounter (Signed)
[facility]

## 2016-04-22 ENCOUNTER — Other Ambulatory Visit: Payer: Self-pay | Admitting: Family Medicine

## 2016-05-14 ENCOUNTER — Other Ambulatory Visit: Payer: Self-pay | Admitting: Family Medicine

## 2016-05-14 NOTE — Telephone Encounter (Signed)
Rx faxed to [facility] 

## 2016-05-14 NOTE — Telephone Encounter (Signed)
Cogentin sent, oxycodone printed.  Thanks.

## 2016-05-15 ENCOUNTER — Other Ambulatory Visit: Payer: Self-pay | Admitting: Family Medicine

## 2016-05-15 NOTE — Telephone Encounter (Signed)
Received refill electronically No record of refills Last office visit 05/26/15

## 2016-05-16 ENCOUNTER — Other Ambulatory Visit: Payer: Self-pay | Admitting: *Deleted

## 2016-05-16 ENCOUNTER — Other Ambulatory Visit: Payer: Self-pay | Admitting: Family Medicine

## 2016-05-16 MED ORDER — CARBIDOPA 25 MG PO TABS: 25.0000 mg | ORAL_TABLET | Freq: Three times a day (TID) | ORAL | 3 refills | Status: DC

## 2016-05-16 NOTE — Telephone Encounter (Signed)
Last Rx 12/01/2014. Last OV 05/2015. Pt is a hospice care pt. pls advise

## 2016-05-16 NOTE — Telephone Encounter (Signed)
Sent. Thanks.   

## 2016-05-16 NOTE — Telephone Encounter (Signed)
Rx last filled 04/11/16--please advise

## 2016-05-16 NOTE — Telephone Encounter (Signed)
Printed.  Please fax in. Thanks.  

## 2016-05-17 NOTE — Telephone Encounter (Signed)
Rx faxed as requested.

## 2016-05-17 NOTE — Telephone Encounter (Signed)
Hospice contacted office regarding Rx. Obtained from [place], CMA and faxed as requested

## 2016-05-24 ENCOUNTER — Other Ambulatory Visit: Payer: Self-pay | Admitting: Family Medicine

## 2016-06-07 ENCOUNTER — Other Ambulatory Visit: Payer: Self-pay | Admitting: Family Medicine

## 2016-06-12 ENCOUNTER — Other Ambulatory Visit: Payer: Self-pay | Admitting: Family Medicine

## 2016-06-12 NOTE — Telephone Encounter (Signed)
Rx faxed to pharmacy as instructed. 

## 2016-06-12 NOTE — Telephone Encounter (Signed)
Received refill electronically Last office visit 05/26/15 Last refills Furosemide 03/11/16 #30/2 Oxycodone  05/14/16 #30

## 2016-06-12 NOTE — Telephone Encounter (Signed)
Lasix prescription sent. Oxycodone printed. Please fax in. Thanks.

## 2016-06-17 ENCOUNTER — Other Ambulatory Visit: Payer: Self-pay | Admitting: Family Medicine

## 2016-06-17 NOTE — Telephone Encounter (Signed)
Last office visit 05/26/2015.  No future appointments scheduled.  Refill?

## 2016-06-18 ENCOUNTER — Other Ambulatory Visit: Payer: Self-pay | Admitting: Family Medicine

## 2016-06-18 NOTE — Telephone Encounter (Signed)
Sent.  Hospice patient.

## 2016-06-18 NOTE — Telephone Encounter (Signed)
Received refill electronically Last refill 05/16/16 #10 Last office visit 05/26/15

## 2016-06-19 NOTE — Telephone Encounter (Signed)
Please fax in.  Thanks.  

## 2016-06-19 NOTE — Telephone Encounter (Signed)
Rx faxed to [facility] at [phone]. 

## 2016-06-28 ENCOUNTER — Telehealth: Payer: Self-pay

## 2016-06-28 NOTE — Telephone Encounter (Signed)
Wendy with [facility]; pt has multiple meds that are not on formulary for hospice and Toniann FailWendy has recommendations from pharmacy provider as substitution meds. Toniann FailWendy is going to fax over the recommendations this afternoon. FYI to Dr Para Marchuncan.

## 2016-06-28 NOTE — Telephone Encounter (Signed)
I'll check the hard copy. Thanks.  

## 2016-07-03 NOTE — Telephone Encounter (Signed)
Toniann FailWendy notified by telephone that Dr. Para Marchuncan is out of the office this week and will check the hard copy when he returns.

## 2016-07-03 NOTE — Telephone Encounter (Signed)
Left message on voicemail for Toniann FailWendy to call back. Need to advise Toniann FailWendy that Dr. Para Marchuncan is out of the office and will work on this when he returns.

## 2016-07-03 NOTE — Telephone Encounter (Signed)
Left message on voicemail for Toniann FailWendy to call back.

## 2016-07-03 NOTE — Telephone Encounter (Signed)
Toniann FailWendy from hospice returned call

## 2016-07-05 ENCOUNTER — Other Ambulatory Visit: Payer: Self-pay | Admitting: Family Medicine

## 2016-07-05 NOTE — Telephone Encounter (Signed)
Last filled 06-12-16 #30 Last OV 05-26-15 Hospice Pt. Can be faxed to pharmacy

## 2016-07-05 NOTE — Telephone Encounter (Signed)
Rx faxed as requested.

## 2016-07-05 NOTE — Telephone Encounter (Signed)
Printed and in CMA box 

## 2016-07-07 NOTE — Telephone Encounter (Signed)
What is status of patient? Still taking any PO meds? If so, then agreed with tapering donepezil down to 2.5mg  a day for 7 days, then stop med.  Okay to change robinul to levsin 0.125mg  PO/SL q 4h prn secretions.  I didn't change his neupro patch or mirapex yet.  Thanks.

## 2016-07-08 ENCOUNTER — Other Ambulatory Visit: Payer: Self-pay | Admitting: Family Medicine

## 2016-07-08 MED ORDER — HYOSCYAMINE SULFATE SL 0.125 MG SL SUBL: 0.1250 mg | SUBLINGUAL_TABLET | SUBLINGUAL | 1 refills | Status: AC | PRN

## 2016-07-08 NOTE — Telephone Encounter (Signed)
Patient's hand and leg movements are worse.  No recognizable speech.  He has short, quick mood swings, Stage II ulcer on Right heel, appetite is fair (about 2 meals per day).  Patient does still take po meds.  Order given to taper Donepezil to 2.5 mg for 7 days then stop med.  Order given to change robinul to Levsin 0.125 mg PO/SL q 4h prn secretions.  Toniann FailWendy, asks if we will phone in Levsin Rx?  Toniann FailWendy states that [employer] Patch is not on their formulary and it will be a big hit financially, ($800 per month) she is not sure about the Mirapex.

## 2016-07-08 NOTE — Telephone Encounter (Signed)
Received refill request electronically Last refill 05/16/16 #30/1 Last office visit 05/25/16

## 2016-07-08 NOTE — Telephone Encounter (Signed)
Sent. Thanks.   

## 2016-07-08 NOTE — Telephone Encounter (Signed)
Left detailed message on voicemail of Hunter Willis. 

## 2016-07-08 NOTE — Telephone Encounter (Signed)
levsin sent.  I would give it one week and then we can consider changing the neupro patch.  Thanks.  Please have them update us next week, sooner if needed.

## 2016-07-10 ENCOUNTER — Other Ambulatory Visit: Payer: Self-pay | Admitting: Family Medicine

## 2016-07-10 NOTE — Telephone Encounter (Signed)
Printed, okay to fax, hospice patient.  Thanks.

## 2016-07-10 NOTE — Telephone Encounter (Signed)
Received refill request electronically Last refill 07/05/16 #30

## 2016-07-10 NOTE — Telephone Encounter (Signed)
Faxed Rx to [facility]

## 2016-07-17 ENCOUNTER — Other Ambulatory Visit: Payer: Self-pay | Admitting: Family Medicine

## 2016-07-17 NOTE — Telephone Encounter (Signed)
Last office visit 05/26/2015.  No future appointments scheduled.  Last refilled 03/18/2016 for #30 with 3 refills.  Ok to refill?

## 2016-07-18 ENCOUNTER — Other Ambulatory Visit: Payer: Self-pay | Admitting: Family Medicine

## 2016-07-18 NOTE — Telephone Encounter (Signed)
Hospice patient- verify that patient is still taking. If so, then okay to refill as is.  Thanks.

## 2016-07-18 NOTE — Telephone Encounter (Signed)
Electronic refill request. Last Filled:    120 tablet 1 05/14/2016  Caregiver says he is out of this medication.  Please advise.

## 2016-07-18 NOTE — Telephone Encounter (Signed)
Caregiver says this medication was picked up yesterday and as far as she knows, he is still taking it.  This is a fill-in caregiver as the previous one quit.

## 2016-07-19 NOTE — Telephone Encounter (Signed)
Sent. Thanks.   

## 2016-07-19 NOTE — Telephone Encounter (Signed)
Thanks

## 2016-07-22 ENCOUNTER — Other Ambulatory Visit: Payer: Self-pay | Admitting: Family Medicine

## 2016-07-22 NOTE — Telephone Encounter (Signed)
Electronic refill request. Fentanyl Patch Last Filled:    10 patch 0 06/19/2016  Please advise.

## 2016-07-23 NOTE — Telephone Encounter (Signed)
Printed, fax in.  Thanks.

## 2016-07-23 NOTE — Telephone Encounter (Signed)
Faxed to pharmacy

## 2016-08-01 ENCOUNTER — Other Ambulatory Visit: Payer: Self-pay | Admitting: Family Medicine

## 2016-08-01 NOTE — Telephone Encounter (Signed)
Electronic refill request. Oxycodone Last Filled:    30 tablet 0 07/10/2016  Please advise.

## 2016-08-01 NOTE — Telephone Encounter (Signed)
Printed.  Thanks.  

## 2016-08-02 NOTE — Telephone Encounter (Signed)
Faxed

## 2016-08-05 ENCOUNTER — Ambulatory Visit: Payer: Medicare Other

## 2016-08-08 ENCOUNTER — Telehealth: Payer: Self-pay

## 2016-08-08 NOTE — Telephone Encounter (Signed)
Please give the order to continue as is with home health. Thanks.

## 2016-08-08 NOTE — Telephone Encounter (Signed)
Hunter Willis with [facility] left v/m that she and Dr Gibson RampFeldman had seen pt earlier today and Dr Gibson RampFeldman is discharging pt from hospice services because pt does not meet criteria for terminally ill 6 mths or less. Pt will be officially discharged on 08/13/16 at 5 pm. [name] request cb about orders for pts equipment and Home health care thru [facility].

## 2016-08-08 NOTE — Telephone Encounter (Signed)
[name] [facility](Hospice) says that Dr. Para Marchuncan needs to write an order for patient to keep his bed and to [facility] because Joyce GrossKay (wife) is getting care from that company and it would be easier for them both to be seen by the same nurse.  Luster LandsbergRenee says she has basically only done monthly catheter changes per Urologist and went to do weekly vital signs.

## 2016-08-08 NOTE — Telephone Encounter (Signed)
Please send an order for him to use his current bed as he requires repositioning not feasible in normal bed, to prevent skin breakdown. He is unable to reposition himself effectively. Please send an order for home health via [facility] for skilled nursing with weekly vital signs and monthly catheter changes. Thanks.

## 2016-08-09 ENCOUNTER — Encounter: Payer: Self-pay | Admitting: *Deleted

## 2016-08-09 NOTE — Telephone Encounter (Signed)
[facility] written and [facility].

## 2016-08-12 NOTE — Telephone Encounter (Signed)
Mrs Gerome ApleyHartley called requesting orders for [facility] and [facility]. Advised on 08/09/16 [facility]ed to [facility]. Mrs Gerome ApleyHartley will ck with [facility] and cb if needed.

## 2016-08-15 DIAGNOSIS — N400 Enlarged prostate without lower urinary tract symptoms: Secondary | ICD-10-CM | POA: Diagnosis not present

## 2016-08-15 DIAGNOSIS — Z87440 Personal history of urinary (tract) infections: Secondary | ICD-10-CM | POA: Diagnosis not present

## 2016-08-15 DIAGNOSIS — Z9181 History of falling: Secondary | ICD-10-CM | POA: Diagnosis not present

## 2016-08-15 DIAGNOSIS — R1310 Dysphagia, unspecified: Secondary | ICD-10-CM | POA: Diagnosis not present

## 2016-08-15 DIAGNOSIS — F0280 Dementia in other diseases classified elsewhere without behavioral disturbance: Secondary | ICD-10-CM | POA: Diagnosis not present

## 2016-08-15 DIAGNOSIS — I2510 Atherosclerotic heart disease of native coronary artery without angina pectoris: Secondary | ICD-10-CM | POA: Diagnosis not present

## 2016-08-15 DIAGNOSIS — Z466 Encounter for fitting and adjustment of urinary device: Secondary | ICD-10-CM | POA: Diagnosis not present

## 2016-08-15 DIAGNOSIS — G20 Parkinson's disease: Secondary | ICD-10-CM | POA: Diagnosis not present

## 2016-08-15 DIAGNOSIS — N183 Chronic kidney disease, stage 3 (moderate): Secondary | ICD-10-CM | POA: Diagnosis not present

## 2016-08-15 DIAGNOSIS — I129 Hypertensive chronic kidney disease with stage 1 through stage 4 chronic kidney disease, or unspecified chronic kidney disease: Secondary | ICD-10-CM | POA: Diagnosis not present

## 2016-08-20 ENCOUNTER — Other Ambulatory Visit: Payer: Self-pay | Admitting: Family Medicine

## 2016-08-20 NOTE — Telephone Encounter (Signed)
V/M left with no name of caller requesting rx for fentanyl patch; last printed # 10 on 07/23/16. Last seen 05/26/15; pt is no longer a hospice pt.Please advise.

## 2016-08-20 NOTE — Telephone Encounter (Signed)
I don't see this medication on the patient's current meds list.  Please advise.

## 2016-08-21 MED ORDER — ROTIGOTINE 6 MG/24HR TD PT24: 1.0000 | MEDICATED_PATCH | Freq: Every day | TRANSDERMAL | 0 refills | Status: DC

## 2016-08-21 MED ORDER — FENTANYL 12 MCG/HR TD PT72: MEDICATED_PATCH | TRANSDERMAL | 0 refills | Status: DC

## 2016-08-21 NOTE — Telephone Encounter (Signed)
Patient's wife notified as instructed by telephone and verbalized understanding. 

## 2016-08-21 NOTE — Telephone Encounter (Signed)
Patient's wife notified as instructed by telephone and verbalized understanding.Mrs. Gerome ApleyHartley stated that their caregiver Cherlynn KaiserLorie Zimmerman will have to pick the script up for them and will bring ID.

## 2016-08-21 NOTE — Telephone Encounter (Signed)
Spoke to patient's wife (on [place]) by telephone. Was advised that patient has not tapered the patch. Patient's wife stated that she talked with the pharmacist at [facility] and was advised that they did not think that would be a good idea. Patient's wife stated that they want to do what the doctor recommends and is willing to taper down the Neupro patch if Dr. Para Marchuncan thinks it is a good idea.

## 2016-08-21 NOTE — Telephone Encounter (Signed)
I would be willing to try a slow taper to see if patient can tolerate the change.   I will lower the dose but if they note any changes then update me and we'll resume the prior dosing.   I think this is reasonable to try.   I didn't put refills on this to prompt reeval of dose in about 1 month, possibly to lower dose.  Thanks.

## 2016-08-21 NOTE — Telephone Encounter (Signed)
Printed.  Thanks.  Since he is no longer on hospice they will have to pick this up. Patient is unable to come in and get the prescription himself.  We need to establish who will be coming to pick up the prescription and they will need to bring identification. He does not need a UDS, given his situation.

## 2016-08-21 NOTE — Telephone Encounter (Signed)
There was a discussion about tapering down the Neupro patch. If he still on it? What strength as he still taking? Are they willing to try to taper down? Please let me know. Thanks.

## 2016-08-22 ENCOUNTER — Encounter: Payer: Self-pay | Admitting: Family Medicine

## 2016-08-22 ENCOUNTER — Telehealth: Payer: Self-pay | Admitting: Family Medicine

## 2016-08-22 NOTE — Telephone Encounter (Signed)
Letter given to [name] to present to family.

## 2016-08-22 NOTE — Telephone Encounter (Signed)
This is completely reasonable.  Letter signed.  Thanks.

## 2016-08-22 NOTE — Telephone Encounter (Signed)
Lawson FiscalLori, caretaker for pt called insisting on an immediate letter stating that wife may speak on pt's behalf due to being bedridden.   [phone]

## 2016-08-26 ENCOUNTER — Other Ambulatory Visit: Payer: Self-pay | Admitting: Family Medicine

## 2016-08-27 ENCOUNTER — Encounter: Payer: Self-pay | Admitting: *Deleted

## 2016-08-29 ENCOUNTER — Telehealth: Payer: Self-pay | Admitting: *Deleted

## 2016-08-29 ENCOUNTER — Telehealth: Payer: Self-pay

## 2016-08-29 NOTE — Telephone Encounter (Signed)
I have sent an appeal letter with the office note which was denied again after the initial PA submission.  I don't know what else to do so I secured this form, hoping that if you can give this information, maybe it will be enough for them to approve the medication.

## 2016-08-29 NOTE — Telephone Encounter (Signed)
Alvino ChapelEllen nurse in medical appeal dept with [facility] left v/m in reference to appeal for fentanyl patch. Case reviewed and denial has been upheld. Will forward for 3rd party external review.Denial upheld because pt does not have outpt part D prescription benefit with [employer]; [employer] only covers medical benefits;could contact [employer] for coverage of fentanyl patch where pharmacy benefits are.

## 2016-08-29 NOTE — Telephone Encounter (Signed)
Resubmitted through [facility] to [employer].

## 2016-08-29 NOTE — Telephone Encounter (Signed)
Please let me know what you hear.  Thanks.  °

## 2016-08-29 NOTE — Telephone Encounter (Signed)
V/M left requesting fentanyl patch refill; [facility] has left previous request; pt is out of patches.

## 2016-09-13 NOTE — Telephone Encounter (Signed)
See other phone note on 08/29/16.

## 2016-09-18 ENCOUNTER — Other Ambulatory Visit: Payer: Self-pay | Admitting: Family Medicine

## 2016-09-18 NOTE — Telephone Encounter (Signed)
Received refill electronically Last refill 06/18/16 #30/2

## 2016-09-19 MED ORDER — ROTIGOTINE 4 MG/24HR TD PT24: 1.0000 | MEDICATED_PATCH | Freq: Every day | TRANSDERMAL | 0 refills | Status: DC

## 2016-09-19 NOTE — Telephone Encounter (Signed)
Sent.  Thanks.  How do they feel about tapering the neuro patch?  Was prev on 8mg , was lowered to 6mg .   Do they think he can tolerate the decreased down to 4 mg? Please let me know.  Thanks.

## 2016-09-19 NOTE — Telephone Encounter (Signed)
I get the point, if any trouble then we can get him back on the 6mg .  I sent the 4mg .  Thanks.

## 2016-09-19 NOTE — Telephone Encounter (Signed)
Wife Joyce Gross(Kay) says we can go ahead and try the 4 mg as long as Dr. Para Marchuncan "can stand by him" if it doesn't work.

## 2016-09-30 ENCOUNTER — Other Ambulatory Visit: Payer: Self-pay | Admitting: Family Medicine

## 2016-09-30 NOTE — Telephone Encounter (Signed)
Printed.  Thanks.  

## 2016-09-30 NOTE — Telephone Encounter (Signed)
Electronic refill request.  Fentanyl Last Filled:    10 patch 0 08/21/2016  Please advise.

## 2016-10-01 ENCOUNTER — Telehealth: Payer: Self-pay | Admitting: *Deleted

## 2016-10-01 MED ORDER — FENTANYL 12 MCG/HR TD PT72: MEDICATED_PATCH | TRANSDERMAL | 0 refills | Status: DC

## 2016-10-01 NOTE — Telephone Encounter (Signed)
Home phone number was busy all afternoon at numerous intervals.  Emergency contact:  Sharen Hint was contacted and advised that Rx was ready for pickup and that we can no longer fax the Rx because patient is not on Hospice.  Kriste Basque says she will pick up the Rx in the next few days.  Rx left at front desk for pick up.

## 2016-10-01 NOTE — Telephone Encounter (Signed)
Noted.  Thanks.  I apologize.   Reprinted.  Patient can't come to pick up rx. I'm okay with family picking up rx.

## 2016-10-01 NOTE — Telephone Encounter (Signed)
Todd with [facility] left a voicemail stating that they received a faxed script for patient's Duragesic patch.  Tawanna Cooler stated that he does not think that patient is still under Hospice care and patient will have to pick the script up and bring it to them. If patient is still under Hospice Care it will need to be put on the script and re-faxed to them.

## 2016-10-01 NOTE — Telephone Encounter (Signed)
Faxed

## 2016-10-03 ENCOUNTER — Other Ambulatory Visit: Payer: Self-pay | Admitting: Family Medicine

## 2016-10-03 NOTE — Telephone Encounter (Signed)
Electronic refill request. Oxycodone Last Filled:    30 tablet 0 08/01/2016  Please advise.

## 2016-10-03 NOTE — Telephone Encounter (Signed)
Contacted Sharen HintBecky Smith for pickup.  Home phone is continually busy for days now.

## 2016-10-03 NOTE — Telephone Encounter (Signed)
Printed.  Thanks.  

## 2016-10-11 ENCOUNTER — Other Ambulatory Visit: Payer: Self-pay | Admitting: Family Medicine

## 2016-10-16 ENCOUNTER — Telehealth: Payer: Self-pay | Admitting: *Deleted

## 2016-10-16 MED ORDER — DOXYCYCLINE HYCLATE 100 MG PO TABS: 100.0000 mg | ORAL_TABLET | Freq: Two times a day (BID) | ORAL | 0 refills | Status: DC

## 2016-10-16 NOTE — Telephone Encounter (Signed)
Spoke to [place] by telephone and was advised that patient does not have a fever and no edema.  Trey PaulaJeff stated that patient did have some bronchi and productive cough which was yellow in color and was coughing a lot. Trey PaulaJeff stated that he will contact Lorrie and have her pick up the prescription and get it started.

## 2016-10-16 NOTE — Telephone Encounter (Signed)
Does have a fever? Does he have increased edema?  Is he wheezing? I would start doxy in the meantime.  rx sent.   Please find out about the above and let me know.   Thanks.

## 2016-10-16 NOTE — Telephone Encounter (Signed)
Trey PaulaJeff ([facility] left a voicemail stating that he was out to change the patient's foley catheter today. Trey PaulaJeff stated that he noticed that patient had crackles and bronchi with diminished base in his lungs. Trey PaulaJeff wanted to know if you wanted to start patient on something for this? Trey PaulaJeff requested that Lorrie his caregiver be notified [phone]). [facility]

## 2016-10-17 NOTE — Telephone Encounter (Signed)
Noted. Thanks.  Please have them update us if not better.  Thanks.

## 2016-10-21 ENCOUNTER — Telehealth: Payer: Self-pay | Admitting: Family Medicine

## 2016-10-21 NOTE — Telephone Encounter (Signed)
AWV not appropriate. Pt is bedridden

## 2016-10-22 NOTE — Telephone Encounter (Signed)
Jeff advised. 

## 2016-10-23 ENCOUNTER — Telehealth: Payer: Self-pay | Admitting: *Deleted

## 2016-10-23 NOTE — Telephone Encounter (Signed)
Received a faxed from from pharmacy requesting a refill on Glycopyrrolate 1 mg, take one tablet by mouth 3 times a day Medication is not on medication list

## 2016-10-24 MED ORDER — GLYCOPYRROLATE 1 MG PO TABS: 1.0000 mg | ORAL_TABLET | Freq: Three times a day (TID) | ORAL | 1 refills | Status: AC

## 2016-10-24 NOTE — Telephone Encounter (Signed)
Sent.  Okay to continue.  Thanks. °

## 2016-10-28 ENCOUNTER — Other Ambulatory Visit: Payer: Self-pay | Admitting: Family Medicine

## 2016-10-28 NOTE — Telephone Encounter (Signed)
Electronic refill request. Neupro Last Filled:    30 patch 0 09/19/2016  Please advise.

## 2016-10-29 MED ORDER — ROTIGOTINE 3 MG/24HR TD PT24: 1.0000 | MEDICATED_PATCH | Freq: Every day | TRANSDERMAL | 0 refills | Status: DC

## 2016-10-29 NOTE — Telephone Encounter (Signed)
Caregiver advised

## 2016-10-29 NOTE — Telephone Encounter (Signed)
We had talked about trying to lower the dose on this prev- see prev phone notes.   I lowered the dose on the patch again.  If they note changes or problems then let me know and we'll change the dose back to .  Thakns.

## 2016-11-04 ENCOUNTER — Other Ambulatory Visit: Payer: Self-pay | Admitting: Family Medicine

## 2016-11-04 DIAGNOSIS — G8929 Other chronic pain: Secondary | ICD-10-CM

## 2016-11-04 MED ORDER — FENTANYL 12 MCG/HR TD PT72: MEDICATED_PATCH | TRANSDERMAL | 0 refills | Status: DC

## 2016-11-04 NOTE — Telephone Encounter (Signed)
Last refill on Oxycodone 10/03/16 #30

## 2016-11-04 NOTE — Telephone Encounter (Signed)
Patient's wife left a voicemail requesting a refill on Fentanyl Patch Last refill 10/01/16 #10 Last office visit 05/26/15

## 2016-11-04 NOTE — Telephone Encounter (Signed)
Printed both.  Thanks.  

## 2016-11-05 NOTE — Telephone Encounter (Signed)
Caregiver advised.  Rx left at front desk for pick up.

## 2016-11-13 ENCOUNTER — Other Ambulatory Visit: Payer: Self-pay | Admitting: Family Medicine

## 2016-11-13 NOTE — Telephone Encounter (Signed)
Sent. Thanks.   

## 2016-11-13 NOTE — Telephone Encounter (Signed)
Received refill request electronically Last refill 07/19/16 #120/3 Last office visit 08/07/15

## 2016-11-28 ENCOUNTER — Telehealth: Payer: Self-pay | Admitting: *Deleted

## 2016-11-28 NOTE — Telephone Encounter (Signed)
Nicholes StairsLacy Smith (brother-in-law) of patient is requesting an FL2 form for Mr. Hunter Willis to try to get him into a facility.  His wife is already in a facility and they would like to try to get him in the same one, possibly the same room.  He is currently having trouble with bed sores because he is bedridden.  The 24/7 care that he is now receiving is becoming unaffordable and Medicaid will likely have to pick up with assistance.  Family is told that an FL2 is the first thing they need to get the ball rolling.

## 2016-11-29 NOTE — Telephone Encounter (Addendum)
FL2 done, please scan.  I had to leave some parts incomplete, ie date of admission, etc.  Please attach a med list.  Thanks.   What is the current status of bedsores?  What is the current intervention?  Thanks.

## 2016-11-29 NOTE — Telephone Encounter (Signed)
Left detailed message on VM of Nicholes StairsLacy Smith ([facility]-in-[facility]  Also left message asking about patient's bed sores and current intervention.

## 2016-11-29 NOTE — Telephone Encounter (Signed)
[facility] stated they would come by to pickup the FL2.

## 2016-12-03 ENCOUNTER — Other Ambulatory Visit: Payer: Self-pay | Admitting: Family Medicine

## 2016-12-03 NOTE — Telephone Encounter (Signed)
Electronic refill request. Oxycodone Last Filled:    30 tablet 0 11/04/2016  Please advise.

## 2016-12-04 NOTE — Telephone Encounter (Signed)
Printed.  I'll sign tomorrow when I get to clinic.  Thanks.  

## 2016-12-05 MED ORDER — OXYCODONE HCL 5 MG PO TABS: ORAL_TABLET | ORAL | 0 refills | Status: DC

## 2016-12-05 NOTE — Telephone Encounter (Signed)
It didn't print.  Printed again.  Thanks.

## 2016-12-05 NOTE — Telephone Encounter (Signed)
Patient advised.  Rx left at front desk for pick up. 

## 2016-12-06 ENCOUNTER — Telehealth: Payer: Self-pay | Admitting: Family Medicine

## 2016-12-06 MED ORDER — FENTANYL 12 MCG/HR TD PT72: MEDICATED_PATCH | TRANSDERMAL | 0 refills | Status: DC

## 2016-12-06 NOTE — Telephone Encounter (Signed)
Hunter Willis caretaker stop by to pick up rx and stating pt needed fentanyl patches  Best number to call [phone]

## 2016-12-06 NOTE — Telephone Encounter (Signed)
Last refill 11/04/16 #10 patch.

## 2016-12-06 NOTE — Telephone Encounter (Signed)
Printed.  Thanks.  

## 2016-12-06 NOTE — Telephone Encounter (Signed)
Rx added to envelope to be picked up by patient's family.

## 2016-12-09 ENCOUNTER — Other Ambulatory Visit: Payer: Self-pay | Admitting: Family Medicine

## 2016-12-17 ENCOUNTER — Other Ambulatory Visit: Payer: Self-pay | Admitting: Family Medicine

## 2016-12-17 NOTE — Telephone Encounter (Signed)
Last refill 09/19/16 #30/2 Last office visit 05/26/15

## 2016-12-18 NOTE — Telephone Encounter (Signed)
Sent. Thanks.   

## 2016-12-25 ENCOUNTER — Other Ambulatory Visit: Payer: Self-pay | Admitting: Family Medicine

## 2016-12-25 NOTE — Telephone Encounter (Signed)
Electronic refill Last refill 05/16/16 #90/3

## 2016-12-25 NOTE — Telephone Encounter (Signed)
Sent. Thanks.   

## 2016-12-30 ENCOUNTER — Other Ambulatory Visit: Payer: Self-pay | Admitting: Family Medicine

## 2016-12-30 NOTE — Telephone Encounter (Signed)
Last refill 12/06/06 #30

## 2016-12-31 NOTE — Telephone Encounter (Signed)
Patient advised.  Rx left at front desk for pick up. 

## 2016-12-31 NOTE — Telephone Encounter (Signed)
Printed.  Thanks.  

## 2017-02-23 ENCOUNTER — Emergency Department: Payer: Medicare Other

## 2017-02-23 ENCOUNTER — Inpatient Hospital Stay
Admission: EM | Admit: 2017-02-23 | Discharge: 2017-02-26 | DRG: 698 | Disposition: A | Discharge status: Skilled Nursing Facility | Payer: Medicare Other | Attending: Internal Medicine | Admitting: Internal Medicine

## 2017-02-23 DIAGNOSIS — L89312 Pressure ulcer of right buttock, stage 2: Secondary | ICD-10-CM | POA: Diagnosis present

## 2017-02-23 DIAGNOSIS — L8990 Pressure ulcer of unspecified site, unspecified stage: Secondary | ICD-10-CM

## 2017-02-23 DIAGNOSIS — G20 Parkinson's disease: Secondary | ICD-10-CM | POA: Diagnosis present

## 2017-02-23 DIAGNOSIS — R7989 Other specified abnormal findings of blood chemistry: Secondary | ICD-10-CM

## 2017-02-23 DIAGNOSIS — N211 Calculus in urethra: Secondary | ICD-10-CM | POA: Diagnosis present

## 2017-02-23 DIAGNOSIS — R739 Hyperglycemia, unspecified: Secondary | ICD-10-CM | POA: Diagnosis present

## 2017-02-23 DIAGNOSIS — D72829 Elevated white blood cell count, unspecified: Secondary | ICD-10-CM

## 2017-02-23 DIAGNOSIS — N202 Calculus of kidney with calculus of ureter: Secondary | ICD-10-CM | POA: Diagnosis present

## 2017-02-23 DIAGNOSIS — E43 Unspecified severe protein-calorie malnutrition: Secondary | ICD-10-CM | POA: Diagnosis present

## 2017-02-23 DIAGNOSIS — K219 Gastro-esophageal reflux disease without esophagitis: Secondary | ICD-10-CM | POA: Diagnosis present

## 2017-02-23 DIAGNOSIS — I959 Hypotension, unspecified: Secondary | ICD-10-CM | POA: Diagnosis not present

## 2017-02-23 DIAGNOSIS — Z6821 Body mass index (BMI) 21.0-21.9, adult: Secondary | ICD-10-CM

## 2017-02-23 DIAGNOSIS — Z66 Do not resuscitate: Secondary | ICD-10-CM | POA: Diagnosis present

## 2017-02-23 DIAGNOSIS — N179 Acute kidney failure, unspecified: Secondary | ICD-10-CM | POA: Diagnosis not present

## 2017-02-23 DIAGNOSIS — Z515 Encounter for palliative care: Secondary | ICD-10-CM | POA: Diagnosis not present

## 2017-02-23 DIAGNOSIS — F0390 Unspecified dementia without behavioral disturbance: Secondary | ICD-10-CM

## 2017-02-23 DIAGNOSIS — D696 Thrombocytopenia, unspecified: Secondary | ICD-10-CM | POA: Diagnosis not present

## 2017-02-23 DIAGNOSIS — Z7401 Bed confinement status: Secondary | ICD-10-CM | POA: Diagnosis not present

## 2017-02-23 DIAGNOSIS — R778 Other specified abnormalities of plasma proteins: Secondary | ICD-10-CM

## 2017-02-23 DIAGNOSIS — Z87442 Personal history of urinary calculi: Secondary | ICD-10-CM

## 2017-02-23 DIAGNOSIS — R748 Abnormal levels of other serum enzymes: Secondary | ICD-10-CM | POA: Diagnosis present

## 2017-02-23 DIAGNOSIS — F0280 Dementia in other diseases classified elsewhere without behavioral disturbance: Secondary | ICD-10-CM | POA: Diagnosis present

## 2017-02-23 DIAGNOSIS — R6521 Severe sepsis with septic shock: Secondary | ICD-10-CM | POA: Diagnosis present

## 2017-02-23 DIAGNOSIS — E785 Hyperlipidemia, unspecified: Secondary | ICD-10-CM | POA: Diagnosis present

## 2017-02-23 DIAGNOSIS — T83511A Infection and inflammatory reaction due to indwelling urethral catheter, initial encounter: Principal | ICD-10-CM | POA: Diagnosis present

## 2017-02-23 DIAGNOSIS — Y846 Urinary catheterization as the cause of abnormal reaction of the patient, or of later complication, without mention of misadventure at the time of the procedure: Secondary | ICD-10-CM | POA: Diagnosis present

## 2017-02-23 DIAGNOSIS — N400 Enlarged prostate without lower urinary tract symptoms: Secondary | ICD-10-CM | POA: Diagnosis present

## 2017-02-23 DIAGNOSIS — E860 Dehydration: Secondary | ICD-10-CM | POA: Diagnosis present

## 2017-02-23 DIAGNOSIS — I10 Essential (primary) hypertension: Secondary | ICD-10-CM | POA: Diagnosis present

## 2017-02-23 DIAGNOSIS — N3090 Cystitis, unspecified without hematuria: Secondary | ICD-10-CM | POA: Diagnosis present

## 2017-02-23 DIAGNOSIS — A419 Sepsis, unspecified organism: Secondary | ICD-10-CM | POA: Diagnosis present

## 2017-02-23 DIAGNOSIS — N170 Acute kidney failure with tubular necrosis: Secondary | ICD-10-CM | POA: Diagnosis present

## 2017-02-23 DIAGNOSIS — Z79899 Other long term (current) drug therapy: Secondary | ICD-10-CM

## 2017-02-23 DIAGNOSIS — L89322 Pressure ulcer of left buttock, stage 2: Secondary | ICD-10-CM | POA: Diagnosis present

## 2017-02-23 DIAGNOSIS — N3000 Acute cystitis without hematuria: Secondary | ICD-10-CM | POA: Diagnosis not present

## 2017-02-23 DIAGNOSIS — G92 Toxic encephalopathy: Secondary | ICD-10-CM | POA: Diagnosis present

## 2017-02-23 DIAGNOSIS — Z7189 Other specified counseling: Secondary | ICD-10-CM | POA: Diagnosis not present

## 2017-02-23 DIAGNOSIS — Z96642 Presence of left artificial hip joint: Secondary | ICD-10-CM | POA: Diagnosis present

## 2017-02-23 DIAGNOSIS — R4182 Altered mental status, unspecified: Secondary | ICD-10-CM | POA: Diagnosis not present

## 2017-02-23 DIAGNOSIS — N320 Bladder-neck obstruction: Secondary | ICD-10-CM | POA: Diagnosis present

## 2017-02-23 DIAGNOSIS — N390 Urinary tract infection, site not specified: Secondary | ICD-10-CM

## 2017-02-23 LAB — CBC WITH DIFFERENTIAL/PLATELET
BASOS ABS: 0.1 10*3/uL (ref 0–0.1)
BASOS PCT: 0 %
EOS ABS: 0 10*3/uL (ref 0–0.7)
EOS PCT: 0 %
HCT: 39.1 % — ABNORMAL LOW (ref 40.0–52.0)
Hemoglobin: 12.8 g/dL — ABNORMAL LOW (ref 13.0–18.0)
LYMPHS PCT: 3 %
Lymphs Abs: 0.7 10*3/uL — ABNORMAL LOW (ref 1.0–3.6)
MCH: 29 pg (ref 26.0–34.0)
MCHC: 32.8 g/dL (ref 32.0–36.0)
MCV: 88.5 fL (ref 80.0–100.0)
MONO ABS: 1.2 10*3/uL — AB (ref 0.2–1.0)
Monocytes Relative: 5 %
Neutro Abs: 21.6 10*3/uL — ABNORMAL HIGH (ref 1.4–6.5)
Neutrophils Relative %: 92 %
PLATELETS: 133 10*3/uL — AB (ref 150–440)
RBC: 4.42 MIL/uL (ref 4.40–5.90)
RDW: 15.4 % — ABNORMAL HIGH (ref 11.5–14.5)
WBC: 23.5 10*3/uL — AB (ref 3.8–10.6)

## 2017-02-23 LAB — COMPREHENSIVE METABOLIC PANEL
ALBUMIN: 2.8 g/dL — AB (ref 3.5–5.0)
ALT: 6 U/L — AB (ref 17–63)
AST: 29 U/L (ref 15–41)
Alkaline Phosphatase: 102 U/L (ref 38–126)
Anion gap: 14 (ref 5–15)
BUN: 60 mg/dL — AB (ref 6–20)
CHLORIDE: 105 mmol/L (ref 101–111)
CO2: 22 mmol/L (ref 22–32)
CREATININE: 5.06 mg/dL — AB (ref 0.61–1.24)
Calcium: 9.1 mg/dL (ref 8.9–10.3)
GFR calc Af Amer: 11 mL/min — ABNORMAL LOW (ref >60)
GFR, EST NON AFRICAN AMERICAN: 10 mL/min — AB (ref >60)
Glucose, Bld: 157 mg/dL — ABNORMAL HIGH (ref 65–99)
Potassium: 3.9 mmol/L (ref 3.5–5.1)
SODIUM: 141 mmol/L (ref 135–145)
Total Bilirubin: 1.4 mg/dL — ABNORMAL HIGH (ref 0.3–1.2)
Total Protein: 6.6 g/dL (ref 6.5–8.1)

## 2017-02-23 LAB — URINALYSIS, COMPLETE (UACMP) WITH MICROSCOPIC
Bilirubin Urine: NEGATIVE
GLUCOSE, UA: NEGATIVE mg/dL
Ketones, ur: NEGATIVE mg/dL
NITRITE: NEGATIVE
Protein, ur: 100 mg/dL — AB
SPECIFIC GRAVITY, URINE: 1.016 (ref 1.005–1.030)
Squamous Epithelial / LPF: NONE SEEN
pH: 7 (ref 5.0–8.0)

## 2017-02-23 LAB — BLOOD CULTURE ID PANEL (REFLEXED)
ACINETOBACTER BAUMANNII: NOT DETECTED
CANDIDA ALBICANS: NOT DETECTED
CANDIDA KRUSEI: NOT DETECTED
CANDIDA PARAPSILOSIS: NOT DETECTED
Candida glabrata: NOT DETECTED
Candida tropicalis: NOT DETECTED
Carbapenem resistance: NOT DETECTED
ENTEROBACTER CLOACAE COMPLEX: NOT DETECTED
Enterobacteriaceae species: DETECTED — AB
Enterococcus species: NOT DETECTED
Escherichia coli: NOT DETECTED
Haemophilus influenzae: NOT DETECTED
KLEBSIELLA OXYTOCA: NOT DETECTED
KLEBSIELLA PNEUMONIAE: NOT DETECTED
LISTERIA MONOCYTOGENES: NOT DETECTED
Neisseria meningitidis: NOT DETECTED
PSEUDOMONAS AERUGINOSA: NOT DETECTED
Proteus species: DETECTED — AB
STREPTOCOCCUS PNEUMONIAE: NOT DETECTED
STREPTOCOCCUS PYOGENES: NOT DETECTED
Serratia marcescens: NOT DETECTED
Staphylococcus aureus (BCID): NOT DETECTED
Staphylococcus species: NOT DETECTED
Streptococcus agalactiae: NOT DETECTED
Streptococcus species: NOT DETECTED

## 2017-02-23 LAB — TROPONIN I: TROPONIN I: 0.03 ng/mL — AB (ref <0.03)

## 2017-02-23 LAB — LACTIC ACID, PLASMA
Lactic Acid, Venous: 3.4 mmol/L (ref 0.5–1.9)
Lactic Acid, Venous: 3.2 mmol/L (ref 0.5–1.9)

## 2017-02-23 LAB — MRSA PCR SCREENING: MRSA by PCR: NEGATIVE

## 2017-02-23 LAB — GLUCOSE, CAPILLARY: Glucose-Capillary: 166 mg/dL — ABNORMAL HIGH (ref 65–99)

## 2017-02-23 MED ORDER — BISACODYL 10 MG RE SUPP: 10.0000 mg | Freq: Every day | RECTAL | Status: DC | PRN

## 2017-02-23 MED ORDER — CHLORHEXIDINE GLUCONATE 0.12 % MT SOLN
15.0000 mL | Freq: Two times a day (BID) | OROMUCOSAL | Status: DC
  Administered 2017-02-23 – 2017-02-25 (×5): 15 mL via OROMUCOSAL
  Filled 2017-02-23 (×4): qty 15

## 2017-02-23 MED ORDER — VANCOMYCIN HCL IN DEXTROSE 1-5 GM/200ML-% IV SOLN
1000.0000 mg | Freq: Once | INTRAVENOUS | Status: AC
  Administered 2017-02-23: 1000 mg via INTRAVENOUS
  Filled 2017-02-23: qty 200

## 2017-02-23 MED ORDER — SODIUM CHLORIDE 0.9 % IV SOLN
500.0000 mg | Freq: Two times a day (BID) | INTRAVENOUS | Status: DC
  Filled 2017-02-23 (×2): qty 0.5

## 2017-02-23 MED ORDER — LACTATED RINGERS IV SOLN
INTRAVENOUS | Status: DC
  Administered 2017-02-23: 12:00:00 via INTRAVENOUS

## 2017-02-23 MED ORDER — SODIUM CHLORIDE 0.9 % IV BOLUS (SEPSIS)
1000.0000 mL | Freq: Once | INTRAVENOUS | Status: AC
  Administered 2017-02-23: 1000 mL via INTRAVENOUS

## 2017-02-23 MED ORDER — PANTOPRAZOLE SODIUM 40 MG IV SOLR
40.0000 mg | INTRAVENOUS | Status: DC
  Administered 2017-02-24 – 2017-02-25 (×3): 40 mg via INTRAVENOUS
  Filled 2017-02-23 (×3): qty 40

## 2017-02-23 MED ORDER — ORAL CARE MOUTH RINSE
15.0000 mL | Freq: Two times a day (BID) | OROMUCOSAL | Status: DC
  Administered 2017-02-25 (×2): 15 mL via OROMUCOSAL

## 2017-02-23 MED ORDER — SODIUM CHLORIDE 0.9% FLUSH
3.0000 mL | Freq: Two times a day (BID) | INTRAVENOUS | Status: DC
  Administered 2017-02-23 – 2017-02-25 (×6): 3 mL via INTRAVENOUS

## 2017-02-23 MED ORDER — HEPARIN SODIUM (PORCINE) 5000 UNIT/ML IJ SOLN
5000.0000 [IU] | Freq: Three times a day (TID) | INTRAMUSCULAR | Status: DC
  Administered 2017-02-23 – 2017-02-25 (×8): 5000 [IU] via SUBCUTANEOUS
  Filled 2017-02-23 (×8): qty 1

## 2017-02-23 MED ORDER — DEXTROSE 5 % IV SOLN
0.0000 ug/min | INTRAVENOUS | Status: DC
  Administered 2017-02-23: 2 ug/min via INTRAVENOUS
  Filled 2017-02-23: qty 4

## 2017-02-23 MED ORDER — ACETAMINOPHEN 325 MG PO TABS: 650.0000 mg | ORAL_TABLET | Freq: Four times a day (QID) | ORAL | Status: DC | PRN

## 2017-02-23 MED ORDER — POLYETHYLENE GLYCOL 3350 17 G PO PACK: 17.0000 g | PACK | Freq: Every day | ORAL | Status: DC | PRN

## 2017-02-23 MED ORDER — PIPERACILLIN-TAZOBACTAM 3.375 G IVPB 30 MIN
3.3750 g | Freq: Once | INTRAVENOUS | Status: AC
  Administered 2017-02-23: 3.375 g via INTRAVENOUS
  Filled 2017-02-23: qty 50

## 2017-02-23 MED ORDER — SODIUM CHLORIDE 0.9 % IV SOLN
500.0000 mg | INTRAVENOUS | Status: DC
  Administered 2017-02-23: 500 mg via INTRAVENOUS
  Filled 2017-02-23 (×2): qty 0.5

## 2017-02-23 MED ORDER — ONDANSETRON HCL 4 MG PO TABS: 4.0000 mg | ORAL_TABLET | Freq: Four times a day (QID) | ORAL | Status: DC | PRN

## 2017-02-23 MED ORDER — ACETAMINOPHEN 650 MG RE SUPP
650.0000 mg | Freq: Four times a day (QID) | RECTAL | Status: DC | PRN
  Administered 2017-02-25 (×3): 650 mg via RECTAL
  Filled 2017-02-23 (×3): qty 1

## 2017-02-23 MED ORDER — ONDANSETRON HCL 4 MG/2ML IJ SOLN
4.0000 mg | Freq: Four times a day (QID) | INTRAMUSCULAR | Status: DC | PRN
  Administered 2017-02-23: 4 mg via INTRAVENOUS
  Filled 2017-02-23: qty 2

## 2017-02-23 MED ORDER — VANCOMYCIN HCL IN DEXTROSE 1-5 GM/200ML-% IV SOLN: 1000.0000 mg | INTRAVENOUS | Status: DC

## 2017-02-23 MED ORDER — ALBUTEROL SULFATE (2.5 MG/3ML) 0.083% IN NEBU: 2.5000 mg | INHALATION_SOLUTION | RESPIRATORY_TRACT | Status: DC | PRN

## 2017-02-23 MED ORDER — FLEET ENEMA 7-19 GM/118ML RE ENEM: 1.0000 | ENEMA | Freq: Every day | RECTAL | Status: DC | PRN

## 2017-02-23 MED ORDER — PIPERACILLIN-TAZOBACTAM 3.375 G IVPB: 3.3750 g | Freq: Two times a day (BID) | INTRAVENOUS | Status: DC

## 2017-02-23 MED ORDER — SODIUM CHLORIDE 0.9 % IV SOLN
INTRAVENOUS | Status: DC
  Administered 2017-02-23: 09:00:00 via INTRAVENOUS

## 2017-02-23 NOTE — ED Notes (Signed)
Admitting MD at bedside.

## 2017-02-23 NOTE — ED Notes (Signed)
Pt returned with RN from ct att

## 2017-02-23 NOTE — ED Notes (Signed)
Called lab to come obtain 2nd set of blood cultures.  Spoke with Dr. Dolores FrameSung regarding difficulty getting 2nd set and starting antibiotics.  Instructed to wait no longer than 20 minutes then start antibiotics.

## 2017-02-23 NOTE — Progress Notes (Signed)
PHARMACY - PHYSICIAN COMMUNICATION CRITICAL VALUE ALERT - BLOOD CULTURE IDENTIFICATION (BCID)  Hunter Willis is an 77 y.o. male who [facility] with a chief complaint of AMS  Assessment:  Meeting 3/4 SIRS criteria for sepsis w/ CT stone study showing foley cath insertion; source likely urinary  Name of physician (or Provider) Contacted: Maude LericheMagdalene Tukov  Current antibiotics: meropenem  Changes to prescribed antibiotics recommended:  Recommendation accepted by provider -- considering patient is still requiring pressors and is tachypneic will continue txt w/ meropenem until patient begins to improve then can de-escalate based on sensitivities  Results for orders placed or performed during the hospital encounter of 02/23/17  Blood Culture ID Panel (Reflexed) (Collected: 02/23/2017  4:49 AM)  Result Value Ref Range   Enterococcus species NOT DETECTED NOT DETECTED   Listeria monocytogenes NOT DETECTED NOT DETECTED   Staphylococcus species NOT DETECTED NOT DETECTED   Staphylococcus aureus NOT DETECTED NOT DETECTED   Streptococcus species NOT DETECTED NOT DETECTED   Streptococcus agalactiae NOT DETECTED NOT DETECTED   Streptococcus pneumoniae NOT DETECTED NOT DETECTED   Streptococcus pyogenes NOT DETECTED NOT DETECTED   Acinetobacter baumannii NOT DETECTED NOT DETECTED   Enterobacteriaceae species DETECTED (A) NOT DETECTED   Enterobacter cloacae complex NOT DETECTED NOT DETECTED   Escherichia coli NOT DETECTED NOT DETECTED   Klebsiella oxytoca NOT DETECTED NOT DETECTED   Klebsiella pneumoniae NOT DETECTED NOT DETECTED   Proteus species DETECTED (A) NOT DETECTED   Serratia marcescens NOT DETECTED NOT DETECTED   Carbapenem resistance NOT DETECTED NOT DETECTED   Haemophilus influenzae NOT DETECTED NOT DETECTED   Neisseria meningitidis NOT DETECTED NOT DETECTED   Pseudomonas aeruginosa NOT DETECTED NOT DETECTED   Candida albicans NOT DETECTED NOT DETECTED   Candida glabrata NOT DETECTED NOT DETECTED   Candida krusei NOT DETECTED NOT DETECTED   Candida parapsilosis NOT DETECTED NOT DETECTED   Candida tropicalis NOT DETECTED NOT DETECTED   Thomasene Rippleavid Kerri-Anne Haeberle, PharmD, BCPS Clinical Pharmacist 02/23/2017

## 2017-02-23 NOTE — Progress Notes (Signed)
Pharmacy Antibiotic Note  Hunter Willis is a 77 y.o. male admitted on 02/23/2017 with sepsis.  Pharmacy has been consulted for vanc/zosyn dosing.  Plan: Patient received vanc 1g and zosyn 3.375g IV x 1 in ED  Will continue w/ vanc 1g q48h to start @ 01/21 @ 0600. Will draw vanc trough 01/27 @ 0500 prior to 4th dose. Will continue zosyn 3.375g IV q12h per CrCl < 20 ml/min  Ke 0.0138 T1/2 50 ~ 48 hrs Goal trough 15 - 20 mcg/mL  Height: 6\' 2"  (188 cm) Weight: 145 lb (65.8 kg) IBW/kg (Calculated) : 82.2  Temp (24hrs), Avg:99.2 F (37.3 C), Min:98.7 F (37.1 C), Max:99.6 F (37.6 C)  Recent Labs  Lab 02/23/17 0449  WBC 23.5*  CREATININE 5.06*  LATICACIDVEN 3.4*    Estimated Creatinine Clearance: 11.2 mL/min (A) (by C-G formula based on SCr of 5.06 mg/dL (H)).    No Known Allergies  Thank you for allowing pharmacy to be a part of this patient's care.  Thomasene Rippleavid Soyla Bainter, PharmD, BCPS Clinical Pharmacist 02/23/2017

## 2017-02-23 NOTE — ED Triage Notes (Signed)
Patient presents to Emergency Department via [facility] with complaints of AMS from [facility].  Per EMS pt has no documentation and no report was given from facility.  Head normally to right, wife was present in room at facility. Hx of P{arkinsons  Fentanyl 3512mcg/hr patch removed from left deltoid, 2mg  NARCAN IV given with minimal response, 500ml NS IV given.

## 2017-02-23 NOTE — H&P (Signed)
[employer] [facility] [place] [facility]   PATIENT NAME: Hunter Willis    MR#:  [id number]  DATE OF BIRTH:  [birth year]  DATE OF ADMISSION:  02/23/2017  PRIMARY CARE PHYSICIAN: Joaquim Nam, MD   REQUESTING/REFERRING PHYSICIAN: Dr. Dolores Frame  CHIEF COMPLAINT:   Chief Complaint  Patient presents with  . Altered Mental Status    HISTORY OF PRESENT ILLNESS:  Hunter Willis  is a 77 y.o. male with a known history of Parkinson's, BPH, mild dementia, chronic Foley catheter presents from [facility] with lethargic. Unfortunately [facility] has a computer system and phone system not working at this time limiting history.  Here in the emergency room patient has been found to have urinary retention with acute kidney injury and UTI.  CT scan of the abdomen showed Foley catheter tip in the urethra with neck of the bladder and ureteral stones.  Foley catheter has been pushed in and draining at this time.  Urology was consulted and suggested outpatient follow-up.  Patient is hypotensive in spite of fluid resuscitation and is being admitted to ICU.  CT scan of the head showed nothing acute.  PAST MEDICAL HISTORY:   Past Medical History:  Diagnosis Date  . Allergy   . BPH (benign prostatic hyperplasia)    prev followed by Dr. Evelene Croon  . Edema   . GERD (gastroesophageal reflux disease)   . Hyperlipidemia   . Hypertension   . Parkinson's disease (HCC)   . Renal stones   . Tremor     PAST SURGICAL HISTORY:   Past Surgical History:  Procedure Laterality Date  . CYSTOSCOPY TUMOR / CONDYLOMATA W/ LASER  01/26/2001  . EMG  06/23/2001   NCL LE'S wnl, Dr. Kemper Durie  . ESOPHAGOGASTRODUODENOSCOPY  01/1998  . HIP ARTHROPLASTY Left 05/31/2013   Procedure: LEFT HIP HEMIARTHROPLASTY;  Surgeon: Shelda Pal, MD;  Location: [facility];  Service: Orthopedics;  Laterality: Left;  . LITHOTRIPSY  10/2003   kidney stone, Dr. Artis Flock  . PROSTATE SURGERY  2000   TURP  . TRANSURETHRAL  RESECTION OF PROSTATE      SOCIAL HISTORY:   Social History   Tobacco Use  . Smoking status: Never Smoker  Substance Use Topics  . Alcohol use: No    FAMILY HISTORY:   Family History  Problem Relation Age of Onset  . Diabetes Mother   . Diabetes Sister   . Cancer Brother        prostate, terminal  . Prostate cancer Brother   . Hypertension Brother   . Hypertension Brother   . Hypertension Brother   . Depression Sister   . Cancer Sister        throat  . Heart disease Father   . Heart disease Brother   . Colon cancer Neg Hx     DRUG ALLERGIES:  No Known Allergies  REVIEW OF SYSTEMS:   Review of Systems  Unable to perform ROS: Mental status change    MEDICATIONS AT HOME:   Prior to Admission medications   Medication Sig Start Date End Date Taking? Authorizing Provider  Acetaminophen 500 MG coapsule Take by mouth.    [provider]  atenolol (TENORMIN) 25 MG tablet TAKE 1 TABLET BY MOUTH TWICE A DAY 10/11/16   Joaquim Nam, MD  benztropine (COGENTIN) 0.5 MG tablet TAKE TWO TABLETS BY MOUTH TWICE A DAY 11/13/16   Joaquim Nam, MD  Carbidopa 25 MG tablet TAKE 1 TABLET BY  MOUTH 3 TIMES DAILY 12/25/16   Joaquim Namuncan, Graham S, MD  carbidopa-levodopa (SINEMET IR) 25-100 MG tablet Take 1.5 tablets by mouth 3 (three) times daily. 12/01/14   Joaquim Namuncan, Graham S, MD  docusate sodium (COLACE) 100 MG capsule Take 100 mg by mouth daily.    [provider]  donepezil (ARICEPT) 5 MG tablet Take 5 mg by mouth at bedtime.    [provider]  doxycycline (VIBRA-TABS) 100 MG tablet Take 1 tablet (100 mg total) by mouth 2 (two) times daily. 10/16/16   Joaquim Namuncan, Graham S, MD  fentaNYL (DURAGESIC - DOSED MCG/HR) 12 MCG/HR PLACE 1 PATCH ONTO THE SKIN EVERY 3 DAYSAS DIRECTED 12/06/16   Joaquim Namuncan, Graham S, MD  finasteride (PROSCAR) 5 MG tablet TAKE 1 TABLET BY MOUTH ONCE DAILY 12/03/16   Joaquim Namuncan, Graham S, MD  fluticasone [facility](FLONASE) 50 MCG/ACT nasal spray Place 2 sprays  into both nostrils daily. 05/26/15   Joaquim Namuncan, Graham S, MD  furosemide (LASIX) 20 MG tablet TAKE 1 TABLET BY MOUTH ONCE DAILY 08/26/16   Joaquim Namuncan, Graham S, MD  glycopyrrolate (ROBINUL) 1 MG tablet Take 1 tablet (1 mg total) by mouth 3 (three) times daily. 10/24/16   Joaquim Namuncan, Graham S, MD  Hyoscyamine Sulfate SL (LEVSIN/SL) 0.125 MG SUBL Place 0.125 mg under the tongue every 4 (four) hours as needed (for secretions). 07/08/16   Joaquim Namuncan, Graham S, MD  ibuprofen (ADVIL,MOTRIN) 200 MG tablet Take 400 mg by mouth every 6 (six) hours as needed for headache or mild pain.    [provider]  loperamide (IMODIUM A-D) 2 MG tablet Take 2 mg by mouth 4 (four) times daily as needed for diarrhea or loose stools.    [provider]  magnesium gluconate (MAGONATE) 500 MG tablet Take 500 mg by mouth 2 (two) times daily.    [provider]  Melatonin 3 MG TABS Take 3 mg by mouth at bedtime.    [provider]  omeprazole (PRILOSEC) 20 MG capsule TAKE 1 CAPSULE BY MOUTH ONCE DAILY 09/18/16   Joaquim Namuncan, Graham S, MD  OVER THE COUNTER MEDICATION Take 2 tablets by mouth at bedtime. Pt takes [employer]--Restful Legs.    [provider]  oxyCODONE (OXY IR/ROXICODONE) 5 MG immediate release tablet TAKE 1/2 TO 1 TABLET BY MOUTH EVERY 4 HOURS AS NEEDED FOR SEVERE BREAKTHROUGH PAIN. 12/31/16   Joaquim Namuncan, Graham S, MD  potassium chloride (K-DUR) 10 MEQ tablet TAKE 1 TABLET BY MOUTH ONCE DAILY WITH FUROSEMIDE (LASIX) 12/09/16   Joaquim Namuncan, Graham S, MD  pramipexole (MIRAPEX) 0.5 MG tablet Take 0.5 mg by mouth at bedtime.    [provider]  promethazine (PHENERGAN) 25 MG tablet Take by mouth.    [provider]  QUEtiapine (SEROQUEL) 50 MG tablet TAKE ONE TABLET BY MOUTH AT BEDTIME 12/18/16   Joaquim Namuncan, Graham S, MD  Rotigotine (NEUPRO) 3 MG/24HR PT24 Place 1 patch onto the skin daily. 10/29/16   Joaquim Namuncan, Graham S, MD  tamsulosin (FLOMAX) 0.4 MG CAPS capsule TAKE 2 CAPSULES BY MOUTH ONCE DAILY  08/20/16   Joaquim Namuncan, Graham S, MD     VITAL SIGNS:  Blood pressure (!) 100/58, pulse 94, temperature 99.6 F (37.6 C), temperature source Rectal, resp. rate 17, height 6\' 2"  (1.88 m), weight 65.8 kg (145 lb), SpO2 100 %.  PHYSICAL EXAMINATION:  Physical Exam  GENERAL:  77 y.o.-year-old patient lying in the bed , response to pain.  Looks critically ill. EYES: Pupils equal, round, reactive to light  HEENT: Head  atraumatic, normocephalic.  NECK:  Supple, no jugular venous distention. LUNGS: Normal breath sounds bilaterally CARDIOVASCULAR: S1, S2 normal. No murmurs, rubs, or gallops.  ABDOMEN: Soft, nontender, nondistended. Bowel sounds present. No organomegaly or mass.  Foley catheter in place EXTREMITIES: No pedal edema, cyanosis, or clubbing.  NEUROLOGIC: Not following commands.  Has myoclonic jerks. PSYCHIATRIC: The patient is lethargic  LABORATORY PANEL:   CBC Recent Labs  Lab 02/23/17 0449  WBC 23.5*  HGB 12.8*  HCT 39.1*  PLT 133*   ------------------------------------------------------------------------------------------------------------------  Chemistries  Recent Labs  Lab 02/23/17 0449  NA 141  K 3.9  CL 105  CO2 22  GLUCOSE 157*  BUN 60*  CREATININE 5.06*  CALCIUM 9.1  AST 29  ALT 6*  ALKPHOS 102  BILITOT 1.4*   ------------------------------------------------------------------------------------------------------------------  Cardiac Enzymes Recent Labs  Lab 02/23/17 0449  TROPONINI 0.03*   ------------------------------------------------------------------------------------------------------------------  RADIOLOGY:  Ct Head Wo Contrast  Result Date: 02/23/2017 CLINICAL DATA:  Altered mental status EXAM: CT HEAD WITHOUT CONTRAST TECHNIQUE: Contiguous axial images were obtained from the base of the skull through the vertex without intravenous contrast. COMPARISON:  Head CT 08/18/2015 FINDINGS: Brain: No mass lesion, intraparenchymal hemorrhage or  extra-axial collection. No evidence of acute cortical infarct. There is periventricular hypoattenuation compatible with chronic microvascular disease. Vascular: No hyperdense vessel or unexpected calcification. Skull: Normal visualized skull base, calvarium and extracranial soft tissues. Sinuses/Orbits: No sinus fluid levels or advanced mucosal thickening. No mastoid effusion. Normal orbits. IMPRESSION: Mild chronic small vessel disease. No acute intracranial abnormality. Electronically Signed   By: Deatra Robinson M.D.   On: 02/23/2017 06:27   Dg Chest Port 1 View  Result Date: 02/23/2017 CLINICAL DATA:  77 year old male with unresponsiveness. EXAM: PORTABLE CHEST 1 VIEW COMPARISON:  Chest radiograph dated 08/18/2015 FINDINGS: The lungs are clear. There is no pleural effusion or pneumothorax. Stable mildly enlarged cardiomediastinal silhouette. No acute osseous pathology. IMPRESSION: 1. No acute cardiopulmonary process. 2. Mildly enlarged cardiomediastinal silhouette, similar to prior radiograph. Electronically Signed   By: Elgie Collard M.D.   On: 02/23/2017 05:31   Ct Renal Stone Study  Result Date: 02/23/2017 CLINICAL DATA:  77 year old male with history of stones and renal failure. EXAM: CT ABDOMEN AND PELVIS WITHOUT CONTRAST TECHNIQUE: Multidetector CT imaging of the abdomen and pelvis was performed following the standard protocol without IV contrast. COMPARISON:  None. FINDINGS: Evaluation of this exam is limited in the absence of intravenous contrast. Evaluation is also limited due to streak artifact caused by patient's left hip arthroplasty, as well as patient's arms and overlying metallic devices. Lower chest: Partially visualized probable small bilateral pleural effusions versus pleural thickening. Bibasilar atelectatic changes. There is mild cardiomegaly. Coronary vascular calcifications noted. There is hypoattenuation of the cardiac blood pool suggestive of a degree of anemia. Clinical  correlation is recommended. No intra-abdominal free air or free fluid. Hepatobiliary: The liver is grossly unremarkable as visualized. No intrahepatic biliary ductal dilatation. The gallbladder is unremarkable. Pancreas: Unremarkable. No pancreatic ductal dilatation or surrounding inflammatory changes. Spleen: Normal in size without focal abnormality. Adrenals/Urinary Tract: The adrenal glands are unremarkable there are multiple nonobstructing right renal calculi measuring up to 11 mm in the upper pole of the right kidney. Multiple punctate nonobstructing left renal stones noted. There is no hydronephrosis on either side. There is diffuse thickened and trabeculated appearance of the bladder wall likely related to chronic bladder outlet obstruction. There is air in the lumen as well as in the wall of the bladder.  Although this may represent trapped air in the bladder diverticula introduced via Foley catheter, emphysematous cystitis is a possibility. Correlation with clinical exam and urinalysis recommended. Layering stones noted along the posterior wall of the urinary bladder measuring approximately 2 cm in length adjacent to the left UVJ. Stomach/Bowel: Large stool ball noted in the rectal vault most consistent with fecal impaction. There is no bowel obstruction or active inflammation. Normal appendix. Vascular/Lymphatic: Advanced aortoiliac atherosclerotic disease. The abdominal aorta and IVC are grossly unremarkable on this noncontrast CT. No portal venous gas. There is no adenopathy. Reproductive: The prostate gland is poorly visualized. A Foley catheter is noted with balloon in the penile urethra and tip in the region of the membranous urethra. Recommend further advancing the catheter into the urinary bladder. There is a 5 mm stone in the penile urethra just distal to the balloon of the Foley catheter. A 5 mm stone is also seen at the level of the membranous urethra. This may cause bladder outlet obstruction.  There is a 6 x 12 mm stone in the pelvis which is poorly evaluated due to streak artifact but is concerning for an impacted stone in the prostatic urethra or at the bladder neck (series 2, image 94 sagittal series 6, image 97 and coronal series 5, image 72). Other: Small fat containing bilateral inguinal hernias. Musculoskeletal: There is a total left hip arthroplasty. There is degenerative changes of the spine. No acute osseous pathology. IMPRESSION: 1. Moderately distended urinary bladder with findings consistent with chronic bladder outlet obstruction. Air within the urinary bladder and bladder wall may have been introduced via Foley catheter. Emphysematous cystitis is not excluded. Correlation with clinical exam and urinalysis recommended. 2. The Foley catheter with balloon in the penile urethra recommend further advancing of the catheter into the urinary bladder. 3. There are two stones in the urethra. A 6 x 12 mm stone in the pelvis likely represent an impacted stone at the bladder neck or in the prostatic urethra. 4. Nonobstructing bilateral renal calculi measure up to 11 mm in the upper pole of the right kidney. No hydronephrosis. 5. Large stool in the rectal vault most consistent with fecal impaction. No bowel obstruction. Normal appendix. 6. Probable partially visualized trace bilateral pleural effusions versus pleural thickening and atelectatic changes. 7. Advanced Aortic Atherosclerosis (ICD10-I70.0). These results were called by telephone at the time of interpretation on 02/23/2017 at 6:38 am to Dr. Chiquita Loth , who verbally acknowledged these results. Electronically Signed   By: Elgie Collard M.D.   On: 02/23/2017 06:47     IMPRESSION AND PLAN:   * Septic shock secondary to chronic Foley catheter related UTI with urinary retention Status post 4 L normal saline.  Start levo fed.  Admit to ICU. Broad-spectrum antibiotics with vancomycin and cefepime started.  Blood and urine culture sent and  pending.  Discussed with Dr. Sung Amabile of ICU.  *Acute kidney injury due to urinary retention and septic shock.  ATN and post renal. Should improve now that we have replaced his Foley catheter.  Monitor input and output.  Consult nephrology if no improvement.  *Neck of bladder and ureteral stones.  Foley catheter in place and draining.  Outpatient cystoscopy per urology after discharge.  *Acute toxic and metabolic encephalopathy with baseline mild dementia.  CT head shows nothing acute. Due to acute kidney injury and sepsis.  Monitor.  *DVT prophylaxis with subcu heparin  All the records are reviewed and case discussed with ED provider. Management plans discussed  with the patient, family and they are in agreement.  CODE STATUS: FULL CODE  TOTAL cc TIME TAKING CARE OF THIS PATIENT: 40 minutes.   Molinda Bailiff Larosa Rhines M.D on 02/23/2017 at 8:29 AM  Between 7am to 6pm - Pager - [phone]  After 6pm go to [email or web address] - password [email or web address] [facility]  [facility] [place] [facility]  Office  [phone]  CC: Primary care physician; Joaquim Nam, MD  Note: This dictation was prepared with Dragon dictation along with smaller phrase technology. Any transcriptional errors that result from this process are unintentional.

## 2017-02-23 NOTE — ED Notes (Signed)
Stat lock add to pt foley, foley advanced into urethra

## 2017-02-23 NOTE — Consult Note (Signed)
I was called by ER MD (Dr Wynelle LinkSun) regarding GU findings on pt being admitted for sepsis of probable GU etiology. His foley was misplaced in his urethra--has since been replaced properly. There are 2 stones within urethra which, if catheter is properly replaced do not currently need mgmt. Stone debris in bladder as well. Bilateral renal stones, R>L. No ureteral stones or hydronephrosis.  At this point, no urgent mgmt of stones needed. I would recommend followup as outpt once infection treated.

## 2017-02-23 NOTE — Clinical Social Work Note (Signed)
CSW received consult that the patient admitted from a SNF. CSW will assess when able and once the patient has arrived at his assigned room on Med/Surg.  Argentina PonderKaren Martha Mainor Hellmann, MSW, Theresia MajorsLCSWA [phone]

## 2017-02-23 NOTE — Progress Notes (Signed)
CODE SEPSIS - PHARMACY COMMUNICATION  **Broad Spectrum Antibiotics should be administered within 1 hour of Sepsis diagnosis**  Time Code Sepsis Called/Page Received: [id number]  Antibiotics Ordered: vanc/zosyn  Time of 1st antibiotic administration: 0533  Additional action taken by pharmacy:   If necessary, Name of Provider/Nurse Contacted:     Thomasene Rippleavid  Sharalee Witman ,PharmD Clinical Pharmacist  02/23/2017  5:40 AM

## 2017-02-23 NOTE — ED Notes (Signed)
This RN transport pt to ct att

## 2017-02-23 NOTE — ED Provider Notes (Signed)
[facility] Emergency Department Provider Note   ____________________________________________   First MD Initiated Contact with Patient 02/23/17 0451     (approximate)  I have reviewed the triage vital signs and the nursing notes.   HISTORY  Chief Complaint Altered Mental Status  Level 5 caveat: History limited by unresponsive state  HPI Hunter Willis is a 77 y.o. male brought to the ED from skilled nursing facility with a chief complaint of unresponsive.  Per EMS, they were not giving a nursing report as the nurses were trying to fix a broken printer and could not pull up patient's chart on the computer as they are having computer issues.  As far as they know, patient is sent for being unresponsive.  EMS found and removed a fentanyl patch.  Administered  2 mg IV Narcan with minimal response.  Reportedly wife shares a room in the nursing facility with the patient.   Past Medical History:  Diagnosis Date  . Allergy   . BPH (benign prostatic hyperplasia)    prev followed by Dr. Evelene Croon  . Edema   . GERD (gastroesophageal reflux disease)   . Hyperlipidemia   . Hypertension   . Parkinson's disease (HCC)   . Renal stones   . Tremor     Patient Active Problem List   Diagnosis Date Noted  . Encounter for chronic pain management 11/04/2016  . Hospice care patient 02/13/2016  . FTT (failure to thrive) in adult 08/25/2015  . Has a tremor 07/06/2015  . HLD (hyperlipidemia) 07/06/2015  . Fall 05/28/2015  . Carotid artery calcification 02/08/2015  . Constipation 02/08/2015  . Lower urinary tract symptoms (LUTS) 12/01/2014  . Pressure ulcer 11/25/2014  . Neck pain 10/14/2014  . Excessive salivation 10/21/2013  . Advance care planning 09/05/2013  . Closed hip fracture (HCC) 05/31/2013  . Femoral neck fracture (HCC) 05/30/2013  . BP (high blood pressure) 05/03/2013  . Blood glucose elevated 05/03/2013  . Acid reflux 05/03/2013  . Benign essential  tremor 05/03/2013  . Hyperglycemia 08/10/2012  . Hyperlipidemia 05/31/2011  . Medicare annual wellness visit, subsequent 05/31/2011  . Back pain 01/04/2011  . Parkinson's disease (HCC) 05/21/2010  . UNSPECIFIED VITAMIN D DEFICIENCY 08/24/2008  . Essential hypertension 07/23/2006  . Allergic rhinitis 07/23/2006  . EROSIVE ESOPHAGITIS 07/23/2006  . GERD 07/23/2006  . RENAL CALCULUS, RECURRENT 07/23/2006  . BPH (benign prostatic hyperplasia) 07/23/2006    Past Surgical History:  Procedure Laterality Date  . CYSTOSCOPY TUMOR / CONDYLOMATA W/ LASER  01/26/2001  . EMG  06/23/2001   NCL LE'S wnl, Dr. Kemper Durie  . ESOPHAGOGASTRODUODENOSCOPY  01/1998  . HIP ARTHROPLASTY Left 05/31/2013   Procedure: LEFT HIP HEMIARTHROPLASTY;  Surgeon: Shelda Pal, MD;  Location: [facility];  Service: Orthopedics;  Laterality: Left;  . LITHOTRIPSY  10/2003   kidney stone, Dr. Artis Flock  . PROSTATE SURGERY  2000   TURP  . TRANSURETHRAL RESECTION OF PROSTATE      Prior to Admission medications   Medication Sig Start Date End Date Taking? Authorizing Provider  Acetaminophen 500 MG coapsule Take by mouth.    [provider]  atenolol (TENORMIN) 25 MG tablet TAKE 1 TABLET BY MOUTH TWICE A DAY 10/11/16   Joaquim Nam, MD  benztropine (COGENTIN) 0.5 MG tablet TAKE TWO TABLETS BY MOUTH TWICE A DAY 11/13/16   Joaquim Nam, MD  Carbidopa 25 MG tablet TAKE 1 TABLET BY MOUTH 3 TIMES DAILY 12/25/16   Joaquim Nam,  MD  carbidopa-levodopa (SINEMET IR) 25-100 MG tablet Take 1.5 tablets by mouth 3 (three) times daily. 12/01/14   Joaquim Namuncan, Graham S, MD  docusate sodium (COLACE) 100 MG capsule Take 100 mg by mouth daily.    [provider]  donepezil (ARICEPT) 5 MG tablet Take 5 mg by mouth at bedtime.    [provider]  doxycycline (VIBRA-TABS) 100 MG tablet Take 1 tablet (100 mg total) by mouth 2 (two) times daily. 10/16/16   Joaquim Namuncan, Graham S, MD  fentaNYL (DURAGESIC - DOSED MCG/HR) 12 MCG/HR  PLACE 1 PATCH ONTO THE SKIN EVERY 3 DAYSAS DIRECTED 12/06/16   Joaquim Namuncan, Graham S, MD  finasteride (PROSCAR) 5 MG tablet TAKE 1 TABLET BY MOUTH ONCE DAILY 12/03/16   Joaquim Namuncan, Graham S, MD  fluticasone [facility](FLONASE) 50 MCG/ACT nasal spray Place 2 sprays into both nostrils daily. 05/26/15   Joaquim Namuncan, Graham S, MD  furosemide (LASIX) 20 MG tablet TAKE 1 TABLET BY MOUTH ONCE DAILY 08/26/16   Joaquim Namuncan, Graham S, MD  glycopyrrolate (ROBINUL) 1 MG tablet Take 1 tablet (1 mg total) by mouth 3 (three) times daily. 10/24/16   Joaquim Namuncan, Graham S, MD  Hyoscyamine Sulfate SL (LEVSIN/SL) 0.125 MG SUBL Place 0.125 mg under the tongue every 4 (four) hours as needed (for secretions). 07/08/16   Joaquim Namuncan, Graham S, MD  ibuprofen (ADVIL,MOTRIN) 200 MG tablet Take 400 mg by mouth every 6 (six) hours as needed for headache or mild pain.    [provider]  loperamide (IMODIUM A-D) 2 MG tablet Take 2 mg by mouth 4 (four) times daily as needed for diarrhea or loose stools.    [provider]  magnesium gluconate (MAGONATE) 500 MG tablet Take 500 mg by mouth 2 (two) times daily.    [provider]  Melatonin 3 MG TABS Take 3 mg by mouth at bedtime.    [provider]  omeprazole (PRILOSEC) 20 MG capsule TAKE 1 CAPSULE BY MOUTH ONCE DAILY 09/18/16   Joaquim Namuncan, Graham S, MD  OVER THE COUNTER MEDICATION Take 2 tablets by mouth at bedtime. Pt takes [employer]--Restful Legs.    [provider]  oxyCODONE (OXY IR/ROXICODONE) 5 MG immediate release tablet TAKE 1/2 TO 1 TABLET BY MOUTH EVERY 4 HOURS AS NEEDED FOR SEVERE BREAKTHROUGH PAIN. 12/31/16   Joaquim Namuncan, Graham S, MD  potassium chloride (K-DUR) 10 MEQ tablet TAKE 1 TABLET BY MOUTH ONCE DAILY WITH FUROSEMIDE (LASIX) 12/09/16   Joaquim Namuncan, Graham S, MD  pramipexole (MIRAPEX) 0.5 MG tablet Take 0.5 mg by mouth at bedtime.    [provider]  promethazine (PHENERGAN) 25 MG tablet Take by mouth.    [provider]  QUEtiapine (SEROQUEL) 50 MG tablet  TAKE ONE TABLET BY MOUTH AT BEDTIME 12/18/16   Joaquim Namuncan, Graham S, MD  Rotigotine (NEUPRO) 3 MG/24HR PT24 Place 1 patch onto the skin daily. 10/29/16   Joaquim Namuncan, Graham S, MD  tamsulosin (FLOMAX) 0.4 MG CAPS capsule TAKE 2 CAPSULES BY MOUTH ONCE DAILY 08/20/16   Joaquim Namuncan, Graham S, MD    Allergies Patient has no known allergies.  Family History  Problem Relation Age of Onset  . Diabetes Mother   . Diabetes Sister   . Cancer Brother        prostate, terminal  . Prostate cancer Brother   . Hypertension Brother   . Hypertension Brother   . Hypertension Brother   . Depression Sister   . Cancer Sister        throat  .  Heart disease Father   . Heart disease Brother   . Colon cancer Neg Hx     Social History Social History   Tobacco Use  . Smoking status: Never Smoker  Substance Use Topics  . Alcohol use: No  . Drug use: Not on file    Review of Systems  Constitutional: No fever/chills. Eyes: No visual changes. ENT: No sore throat. Cardiovascular: Denies chest pain. Respiratory: Denies shortness of breath. Gastrointestinal: No abdominal pain.  No nausea, no vomiting.  No diarrhea.  No constipation. Genitourinary: Negative for dysuria. Musculoskeletal: Negative for back pain. Skin: Negative for rash. Neurological: Negative for headaches, focal weakness or numbness.  Unable to obtain ROS as there was no report from nursing facility and patient is unresponsive.  No family at bedside. ____________________________________________   PHYSICAL EXAM:  VITAL SIGNS: ED Triage Vitals [02/23/17 0449]  Enc Vitals Group     BP      Pulse      Resp      Temp      Temp src      SpO2 93 %     Weight      Height      Head Circumference      Peak Flow      Pain Score      Pain Loc      Pain Edu?      Excl. in GC?     Constitutional: Unresponsive.   Eyes: Conjunctivae are normal. PERRL. EOMI. Head: Atraumatic. Nose: No congestion/rhinnorhea. Mouth/Throat: Mucous membranes  are dry.   Neck: No stridor.   Cardiovascular: Normal rate, regular rhythm. Grossly normal heart sounds.  Good peripheral circulation. Respiratory: Normal respiratory effort.  No retractions. Lungs diminished bibasilarly. Gastrointestinal: Soft and nontender. No distention. No abdominal bruits. No CVA tenderness. Genitourinary: Indwelling Foley noted. Musculoskeletal: No lower extremity tenderness nor edema.  No joint effusions. Neurologic: Unresponsive.  Responsive to painful stimuli.  Fine tremors noted to bilateral hands.  BLE movements to painful stimuli.  Bilateral heel boots. Skin:  Skin is hot, dry and intact. No rash noted. Psychiatric: Unable to assess. ____________________________________________   LABS (all labs ordered are listed, but only abnormal results are displayed)  Labs Reviewed  CBC WITH DIFFERENTIAL/PLATELET - Abnormal; Notable for the following components:      Result Value   WBC 23.5 (*)    Hemoglobin 12.8 (*)    HCT 39.1 (*)    RDW 15.4 (*)    Platelets 133 (*)    Neutro Abs 21.6 (*)    Lymphs Abs 0.7 (*)    Monocytes Absolute 1.2 (*)    All other components within normal limits  COMPREHENSIVE METABOLIC PANEL - Abnormal; Notable for the following components:   Glucose, Bld 157 (*)    BUN 60 (*)    Creatinine, Ser 5.06 (*)    Albumin 2.8 (*)    ALT 6 (*)    Total Bilirubin 1.4 (*)    GFR calc non Af Amer 10 (*)    GFR calc Af Amer 11 (*)    All other components within normal limits  TROPONIN I - Abnormal; Notable for the following components:   Troponin I 0.03 (*)    All other components within normal limits  LACTIC ACID, PLASMA - Abnormal; Notable for the following components:   Lactic Acid, Venous 3.4 (*)    All other components within normal limits  URINALYSIS, COMPLETE (UACMP) WITH MICROSCOPIC - Abnormal; Notable for the  following components:   Color, Urine YELLOW (*)    APPearance TURBID (*)    Hgb urine dipstick SMALL (*)    Protein, ur 100  (*)    Leukocytes, UA SMALL (*)    Bacteria, UA MANY (*)    All other components within normal limits  CULTURE, BLOOD (ROUTINE X 2)  CULTURE, BLOOD (ROUTINE X 2)  URINE CULTURE  LACTIC ACID, PLASMA   ____________________________________________  EKG  ED ECG REPORT I, Aylla Huffine J, the attending physician, personally viewed and interpreted this ECG.   Date: 02/23/2017  EKG Time: 0451  Rate: 93  Rhythm: normal EKG, normal sinus rhythm  Axis: LAD  Intervals:none  ST&T Change: Nonspecific  ____________________________________________  RADIOLOGY  Ct Head Wo Contrast  Result Date: 02/23/2017 CLINICAL DATA:  Altered mental status EXAM: CT HEAD WITHOUT CONTRAST TECHNIQUE: Contiguous axial images were obtained from the base of the skull through the vertex without intravenous contrast. COMPARISON:  Head CT 08/18/2015 FINDINGS: Brain: No mass lesion, intraparenchymal hemorrhage or extra-axial collection. No evidence of acute cortical infarct. There is periventricular hypoattenuation compatible with chronic microvascular disease. Vascular: No hyperdense vessel or unexpected calcification. Skull: Normal visualized skull base, calvarium and extracranial soft tissues. Sinuses/Orbits: No sinus fluid levels or advanced mucosal thickening. No mastoid effusion. Normal orbits. IMPRESSION: Mild chronic small vessel disease. No acute intracranial abnormality. Electronically Signed   By: Deatra Robinson M.D.   On: 02/23/2017 06:27   Dg Chest Port 1 View  Result Date: 02/23/2017 CLINICAL DATA:  77 year old male with unresponsiveness. EXAM: PORTABLE CHEST 1 VIEW COMPARISON:  Chest radiograph dated 08/18/2015 FINDINGS: The lungs are clear. There is no pleural effusion or pneumothorax. Stable mildly enlarged cardiomediastinal silhouette. No acute osseous pathology. IMPRESSION: 1. No acute cardiopulmonary process. 2. Mildly enlarged cardiomediastinal silhouette, similar to prior radiograph. Electronically Signed    By: Elgie Collard M.D.   On: 02/23/2017 05:31   Ct Renal Stone Study  Result Date: 02/23/2017 CLINICAL DATA:  77 year old male with history of stones and renal failure. EXAM: CT ABDOMEN AND PELVIS WITHOUT CONTRAST TECHNIQUE: Multidetector CT imaging of the abdomen and pelvis was performed following the standard protocol without IV contrast. COMPARISON:  None. FINDINGS: Evaluation of this exam is limited in the absence of intravenous contrast. Evaluation is also limited due to streak artifact caused by patient's left hip arthroplasty, as well as patient's arms and overlying metallic devices. Lower chest: Partially visualized probable small bilateral pleural effusions versus pleural thickening. Bibasilar atelectatic changes. There is mild cardiomegaly. Coronary vascular calcifications noted. There is hypoattenuation of the cardiac blood pool suggestive of a degree of anemia. Clinical correlation is recommended. No intra-abdominal free air or free fluid. Hepatobiliary: The liver is grossly unremarkable as visualized. No intrahepatic biliary ductal dilatation. The gallbladder is unremarkable. Pancreas: Unremarkable. No pancreatic ductal dilatation or surrounding inflammatory changes. Spleen: Normal in size without focal abnormality. Adrenals/Urinary Tract: The adrenal glands are unremarkable there are multiple nonobstructing right renal calculi measuring up to 11 mm in the upper pole of the right kidney. Multiple punctate nonobstructing left renal stones noted. There is no hydronephrosis on either side. There is diffuse thickened and trabeculated appearance of the bladder wall likely related to chronic bladder outlet obstruction. There is air in the lumen as well as in the wall of the bladder. Although this may represent trapped air in the bladder diverticula introduced via Foley catheter, emphysematous cystitis is a possibility. Correlation with clinical exam and urinalysis recommended. Layering stones noted  along  the posterior wall of the urinary bladder measuring approximately 2 cm in length adjacent to the left UVJ. Stomach/Bowel: Large stool ball noted in the rectal vault most consistent with fecal impaction. There is no bowel obstruction or active inflammation. Normal appendix. Vascular/Lymphatic: Advanced aortoiliac atherosclerotic disease. The abdominal aorta and IVC are grossly unremarkable on this noncontrast CT. No portal venous gas. There is no adenopathy. Reproductive: The prostate gland is poorly visualized. A Foley catheter is noted with balloon in the penile urethra and tip in the region of the membranous urethra. Recommend further advancing the catheter into the urinary bladder. There is a 5 mm stone in the penile urethra just distal to the balloon of the Foley catheter. A 5 mm stone is also seen at the level of the membranous urethra. This may cause bladder outlet obstruction. There is a 6 x 12 mm stone in the pelvis which is poorly evaluated due to streak artifact but is concerning for an impacted stone in the prostatic urethra or at the bladder neck (series 2, image 94 sagittal series 6, image 97 and coronal series 5, image 72). Other: Small fat containing bilateral inguinal hernias. Musculoskeletal: There is a total left hip arthroplasty. There is degenerative changes of the spine. No acute osseous pathology. IMPRESSION: 1. Moderately distended urinary bladder with findings consistent with chronic bladder outlet obstruction. Air within the urinary bladder and bladder wall may have been introduced via Foley catheter. Emphysematous cystitis is not excluded. Correlation with clinical exam and urinalysis recommended. 2. The Foley catheter with balloon in the penile urethra recommend further advancing of the catheter into the urinary bladder. 3. There are two stones in the urethra. A 6 x 12 mm stone in the pelvis likely represent an impacted stone at the bladder neck or in the prostatic urethra. 4.  Nonobstructing bilateral renal calculi measure up to 11 mm in the upper pole of the right kidney. No hydronephrosis. 5. Large stool in the rectal vault most consistent with fecal impaction. No bowel obstruction. Normal appendix. 6. Probable partially visualized trace bilateral pleural effusions versus pleural thickening and atelectatic changes. 7. Advanced Aortic Atherosclerosis (ICD10-I70.0). These results were called by telephone at the time of interpretation on 02/23/2017 at 6:38 am to Dr. Chiquita Loth , who verbally acknowledged these results. Electronically Signed   By: Elgie Collard M.D.   On: 02/23/2017 06:47    ____________________________________________   PROCEDURES  Procedure(s) performed: None  Procedures  Critical Care performed: Yes, see critical care note(s)   CRITICAL CARE Performed by: Irean Hong   Total critical care time: 45 minutes  Critical care time was exclusive of separately billable procedures and treating other patients.  Critical care was necessary to treat or prevent imminent or life-threatening deterioration.  Critical care was time spent personally by me on the following activities: development of treatment plan with patient and/or surrogate as well as nursing, discussions with consultants, evaluation of patient's response to treatment, examination of patient, obtaining history from patient or surrogate, ordering and performing treatments and interventions, ordering and review of laboratory studies, ordering and review of radiographic studies, pulse oximetry and re-evaluation of patient's condition.  ____________________________________________   INITIAL IMPRESSION / ASSESSMENT AND PLAN / ED COURSE  As part of my medical decision making, I reviewed the following data within the electronic MEDICAL RECORD NUMBER Nursing notes reviewed and incorporated, Labs reviewed, EKG interpreted, Old chart reviewed, Radiograph reviewed, Discussed with admitting physician (Dr.  Tobi Bastos) and Notes from prior ED visits.  77 year old male sent from nursing facility with a chief complaint of unresponsiveness.  History of Parkinson's, BPH, hypertension, hyperlipidemia.  Differential diagnosis includes but is not limited to encephalopathy, CVA, ICH, metabolic, infectious etiologies.  Room air sats 93%.  Patient has indwelling Foley catheter.  Bilateral heel booties for presumed decubiti.  Patient certainly has nidus for infection from multiple sources.  Feels warm to the touch, will obtain rectal temperature.  Hypotensive.  Will activate ED code sepsis, initiate IV fluid resuscitation and broad-spectrum antibiotics.  Clinical Course as of Feb 24 732  Sun Feb 23, 2017  1610 Blood pressure 81/45 with fluids.  Laboratory urinalysis notable for leukocytosis, new renal failure, elevated troponin, elevated lactate and UTI.  Review of chart notable for history of renal stones.  Will obtain renal colic CT to evaluate for obstructive uropathy.  [JS]  9604 Discussed CT with radiologist; Foley catheter tip in the penile urethra.  Nursing readjusted Foley catheter with now good flow of urine.  Will discuss with hospitalist to evaluate patient in the emergency.  No family at bedside.  Called again to [facility] who is still unable to access patient's computerized chart or print patient's paperwork including MAR to fax it to [place].  I have no phone number to contact next of kin or healthcare power of attorney.  Facility did state that patient is full code.  He remains hypotensive despite  3 L normal saline.  Will start vasopressors.  [JS]  0652 BP 93/77.  Will hold Levophed for now.  [JS]  [id number] Consulted urology via telephone regarding patient's CT scan and whether there was need for urgent intervention.  Dr. Retta Diones to review scan and call me back.  [JS]  [id number] Spoke with Dr. Juliene Pina from hospitalist services who will evaluate patient in the emergency department for admission.  [JS]    [id number] Spoke with Dr. Retta Diones who reviewed patient's CT scan.  Since Foley catheter has been placed in the proper position, no urgent intervention required at this time.  Patient may be seen in the office at a later date for probable cystoscopy.  See consult note in patient's chart.  [JS]    Clinical Course User Index [JS] Irean Hong, MD     ____________________________________________   FINAL CLINICAL IMPRESSION(S) / ED DIAGNOSES  Final diagnoses:  Altered mental status, unspecified altered mental status type  Hypotension, unspecified hypotension type  Urinary tract infection associated with indwelling urethral catheter, initial encounter (HCC)  Leukocytosis, unspecified type  Acute renal failure, unspecified acute renal failure type (HCC)  Elevated troponin     ED Discharge Orders    None       Note:  This document was prepared using Dragon voice recognition software and may include unintentional dictation errors.    Irean Hong, MD 02/23/17 [phone]

## 2017-02-23 NOTE — ED Notes (Signed)
Per [employer]omatic [employer] operational officersecretary [facility]

## 2017-02-23 NOTE — Consult Note (Signed)
PULMONARY / CRITICAL CARE MEDICINE   Name: Hunter Willis MRN: [id number] DOB: 09/03/1940    ADMISSION DATE:  02/23/2017  PT PROFILE:   [age] M SNF resident with severe chronic debilitation due to advanced Parkinson's and dementia.  At his baseline, he is bed bound, has chronic Foley catheter, requires assistance with feeding and is very poorly oriented, not recognizing family members consistently.  He was admitted via ED with severe sepsis/septic shock due to urinary tract infection caused by malpositioned Foley catheter.  MAJOR EVENTS/TEST RESULTS: 01/20 admitted from SNF via ED with severe sepsis/septic shock.  Received 4 L NS administered in ED. Low dose NE initiated. Received Vanc and Zosyn 01/20 CT head: Mild chronic small vessel disease. No acute intracranial abnormality 01/20 CT renal stone study: Moderately distended urinary bladder. Air within the urinary bladder and bladder wall may have been introduced via Foley catheter. Emphysematous cystitis is not excluded. The Foley catheter with balloon in the penile urethra recommend further advancing of the catheter into the urinary bladder. There are two stones in the urethra. A 6 x 12 mm stone in the pelvis likely represent an impacted stone at the bladder neck or in the prostatic urethra. Nonobstructing bilateral renal calculi measure up to 11 mm in the upper pole of the right kidney. No hydronephrosis 01/20 Urology consultation: Foley re-positioned  INDWELLING DEVICES:   MICRO DATA: MRSA PCR 01/20 >> NEG Urine 01/20 >>   Blood 01/20 >>   ANTIMICROBIALS:  Vanc/Zosyn X 1 01/20 Meropenem 01/20 >>     HISTORY OF PRESENT ILLNESS:   As above.  He is unable to provide any further history.  PAST MEDICAL HISTORY :  He  has a past medical history of Allergy, BPH (benign prostatic hyperplasia), Edema, GERD (gastroesophageal reflux disease), Hyperlipidemia, Hypertension, Parkinson's disease (HCC), Renal stones, and Tremor.  PAST SURGICAL  HISTORY: He  has a past surgical history that includes Esophagogastroduodenoscopy (01/1998); Cystoscopy tumor / condylomata w/ laser (01/26/2001); EMG (06/23/2001); Lithotripsy (10/2003); Hip Arthroplasty (Left, 05/31/2013); Transurethral resection of prostate; and Prostate surgery (2000).  No Known Allergies  No current facility-administered medications on file prior to encounter.    Current Outpatient Medications on File Prior to Encounter  Medication Sig  . atenolol (TENORMIN) 25 MG tablet TAKE 1 TABLET BY MOUTH TWICE A DAY  . benztropine (COGENTIN) 0.5 MG tablet TAKE TWO TABLETS BY MOUTH TWICE A DAY  . Carbidopa 25 MG tablet TAKE 1 TABLET BY MOUTH 3 TIMES DAILY  . carbidopa-levodopa (SINEMET IR) 25-100 MG tablet Take 1.5 tablets by mouth 3 (three) times daily.  Marland Kitchen. docusate sodium (COLACE) 100 MG capsule Take 100 mg by mouth daily.  Marland Kitchen. donepezil (ARICEPT) 5 MG tablet Take 5 mg by mouth at bedtime.  . fentaNYL (DURAGESIC - DOSED MCG/HR) 12 MCG/HR PLACE 1 PATCH ONTO THE SKIN EVERY 3 DAYSAS DIRECTED  . finasteride (PROSCAR) 5 MG tablet TAKE 1 TABLET BY MOUTH ONCE DAILY  . fluticasone (FLONASE) 50 MCG/ACT nasal spray Place 2 sprays into both nostrils daily.  . furosemide (LASIX) 20 MG tablet TAKE 1 TABLET BY MOUTH ONCE DAILY  . glycopyrrolate (ROBINUL) 1 MG tablet Take 1 tablet (1 mg total) by mouth 3 (three) times daily.  Marland Kitchen. loperamide (IMODIUM A-D) 2 MG tablet Take 2 mg by mouth every 6 (six) hours as needed for diarrhea or loose stools.   . magnesium gluconate (MAGONATE) 500 MG tablet Take 500 mg by mouth 2 (two) times daily.  . Melatonin 3 MG  TABS Take 3 mg by mouth at bedtime.  Marland Kitchen omeprazole (PRILOSEC) 20 MG capsule TAKE 1 CAPSULE BY MOUTH ONCE DAILY  . oxyCODONE (OXY IR/ROXICODONE) 5 MG immediate release tablet TAKE 1/2 TO 1 TABLET BY MOUTH EVERY 4 HOURS AS NEEDED FOR SEVERE BREAKTHROUGH PAIN. (Patient taking differently: TAKE 1/2 TABLET BY MOUTH EVERY 4 HOURS AS NEEDED FOR SEVERE  BREAKTHROUGH PAIN.)  . potassium chloride (K-DUR) 10 MEQ tablet TAKE 1 TABLET BY MOUTH ONCE DAILY WITH FUROSEMIDE (LASIX)  . promethazine (PHENERGAN) 25 MG tablet Take 25 mg by mouth every 6 (six) hours as needed.   Marland Kitchen QUEtiapine (SEROQUEL) 50 MG tablet TAKE ONE TABLET BY MOUTH AT BEDTIME  . Rotigotine (NEUPRO) 3 MG/24HR PT24 Place 1 patch onto the skin daily.  . tamsulosin (FLOMAX) 0.4 MG CAPS capsule TAKE 2 CAPSULES BY MOUTH ONCE DAILY  . Hyoscyamine Sulfate SL (LEVSIN/SL) 0.125 MG SUBL Place 0.125 mg under the tongue every 4 (four) hours as needed (for secretions). (Patient not taking: Reported on 02/23/2017)  . pramipexole (MIRAPEX) 0.5 MG tablet Take 0.5 mg by mouth at bedtime.    FAMILY HISTORY:  His indicated that his mother is deceased. He indicated that his father is deceased. He indicated that two of his four sisters are alive. He indicated that three of his five brothers are alive. He indicated that the status of his neg hx is unknown.   SOCIAL HISTORY: Records indicate that he has never smoked and does not drink alcohol. He does not have any smokeless tobacco history on file.  REVIEW OF SYSTEMS:   Level 5 caveat  SUBJECTIVE:    VITAL SIGNS: BP 104/63   Pulse 96   Temp 98.6 F (37 C) (Axillary)   Resp 18   Ht 6' (1.829 m)   Wt 73.3 kg (161 lb 9.6 oz)   SpO2 100%   BMI 21.92 kg/m   HEMODYNAMICS:    VENTILATOR SETTINGS:    INTAKE / OUTPUT: I/O last 3 completed shifts: In: 27 [IV Piggyback:50] Out: -   PHYSICAL EXAMINATION: General: Frail, somnolent, no overt distress Neuro: Pupils react, withdraws from pain, minimal spontaneous movement, nonverbal HEENT: NCAT, sclerae white Cardiovascular: Regular, no M Lungs: Clear anteriorly Abdomen: Soft, NT, + BS Genitourinary: Thick cloudy urine in Foley bag Ext: Warm, no edema Skin: No lesions noted  LABS:  BMET Recent Labs  Lab 02/23/17 0449  NA 141  K 3.9  CL 105  CO2 22  BUN 60*  CREATININE 5.06*   GLUCOSE 157*    Electrolytes Recent Labs  Lab 02/23/17 0449  CALCIUM 9.1    CBC Recent Labs  Lab 02/23/17 0449  WBC 23.5*  HGB 12.8*  HCT 39.1*  PLT 133*    Coag's No results for input(s): APTT, INR in the last 168 hours.  Sepsis Markers Recent Labs  Lab 02/23/17 0449 02/23/17 0647  LATICACIDVEN 3.4* 3.2*    ABG No results for input(s): PHART, PCO2ART, PO2ART in the last 168 hours.  Liver Enzymes Recent Labs  Lab 02/23/17 0449  AST 29  ALT 6*  ALKPHOS 102  BILITOT 1.4*  ALBUMIN 2.8*    Cardiac Enzymes Recent Labs  Lab 02/23/17 0449  TROPONINI 0.03*    Glucose Recent Labs  Lab 02/23/17 0907  GLUCAP 166*    CXR: NAD   DISCUSSION: Severe sepsis/septic shock due to UTI/malpositioned Foley catheter Chronicaly and severely debilitated @ baseline No previously designated HCPOA. Wife in SNF resident with dementia Closest family members are  3 brothers in law  I spoke in detail with his brothers in law who have been close to him and visit him regularly in the SNF. They inform me that the patient has frequently expressed a desire to die. We discussed goals of care and they are all in agreement that he would not wish to undergo ACLS, intubation/mechanical ventilation or dialysis. We agreed to continue with antibiotics and low dose vasopressors as currently. We will not place CVL, provide any form of ventilatory support or undertake ACLS or hemodialysis. Our principle goal is to maintain his comfort and dignity  ASSESSMENT / PLAN:  PULMONARY A: No issues P:   Supplemental O2 as needed DNI - see   CARDIOVASCULAR A:  Septic shock P:  MAP goal Norepi (not to exceed 10 mcg/min) as needed to achieve MAP goal No other vasopressors will be initiated  RENAL A:   AKI Bladder outlet obstruction due to Foley malposition Not a candidate for HD - see above P:   Monitor BMET intermittently Monitor I/Os Correct electrolytes as indicated   Maintenance IVFs ordered  GASTROINTESTINAL A:   No issues P:   SUP: N/I Consider nutrition when able Will NOT place any form of feeding tube  HEMATOLOGIC A:   Mild thrombocytopenia P:  DVT px: SQ heparin Monitor CBC intermittently Transfuse per usual guidelines   INFECTIOUS A:   Severe sepsis UTI, severe cystitis P:   Monitor temp, WBC count Micro and abx as above   ENDOCRINE A:   Mild stress induced hyperglycemia without hx of DM P:   Monitor glu on chem panels Consider SSI for glu > 180  NEUROLOGIC A:   Parkinson's with advanced dementia Severe debilitation @ baseline P:   RASS goal: 0 Will provide analgesia as needed   Billy Fischer, MD PCCM service Mobile [phone] Pager [phone] 02/23/2017 1:08 PM

## 2017-02-23 NOTE — Progress Notes (Signed)
ANTIBIOTIC CONSULT NOTE - INITIAL  Pharmacy Consult for merrem  Indication: UTI  No Known Allergies  Patient Measurements: Height: 6' (182.9 cm) Weight: 161 lb 9.6 oz (73.3 kg) IBW/kg (Calculated) : 77.6 Adjusted Body Weight:   Vital Signs: Temp: 98.6 F (37 C) (01/20 0904) Temp Source: Axillary (01/20 0904) BP: 104/63 (01/20 0904) Pulse Rate: 96 (01/20 0904) Intake/Output from previous day: 01/19 0701 - 01/20 0700 In: 50 [IV Piggyback:50] Out: -  Intake/Output from this shift: No intake/output data recorded.  Labs: Recent Labs    02/23/17 0449  WBC 23.5*  HGB 12.8*  PLT 133*  CREATININE 5.06*   Estimated Creatinine Clearance: 12.5 mL/min (A) (by C-G formula based on SCr of 5.06 mg/dL (H)). No results for input(s): VANCOTROUGH, VANCOPEAK, VANCORANDOM, GENTTROUGH, GENTPEAK, GENTRANDOM, TOBRATROUGH, TOBRAPEAK, TOBRARND, AMIKACINPEAK, AMIKACINTROU, AMIKACIN in the last 72 hours.   Microbiology: Recent Results (from the past 720 hour(s))  MRSA PCR Screening     Status: None   Collection Time: 02/23/17  9:01 AM  Result Value Ref Range Status   MRSA by PCR NEGATIVE NEGATIVE Final    Comment:        The GeneXpert MRSA Assay (FDA approved for NASAL specimens only), is one component of a comprehensive MRSA colonization surveillance program. It is not intended to diagnose MRSA infection nor to guide or monitor treatment for MRSA infections. Performed at [facility], [address] [place], [place] [id number]     Medical History: Past Medical History:  Diagnosis Date  . Allergy   . BPH (benign prostatic hyperplasia)    prev followed by Dr. Evelene CroonWolff  . Edema   . GERD (gastroesophageal reflux disease)   . Hyperlipidemia   . Hypertension   . Parkinson's disease (HCC)   . Renal stones   . Tremor     Medications:  Medications Prior to Admission  Medication Sig Dispense Refill Last Dose  . atenolol (TENORMIN) 25 MG tablet TAKE 1 TABLET BY MOUTH TWICE  A DAY 180 tablet 3 N/A at N/A  . benztropine (COGENTIN) 0.5 MG tablet TAKE TWO TABLETS BY MOUTH TWICE A DAY 120 tablet 5 N/A at N/A  . Carbidopa 25 MG tablet TAKE 1 TABLET BY MOUTH 3 TIMES DAILY 90 tablet 3 N/A at N/A  . carbidopa-levodopa (SINEMET IR) 25-100 MG tablet Take 1.5 tablets by mouth 3 (three) times daily.   N/A at N/A  . docusate sodium (COLACE) 100 MG capsule Take 100 mg by mouth daily.   N/A at N/A  . donepezil (ARICEPT) 5 MG tablet Take 5 mg by mouth at bedtime.   N/A at N/A  . fentaNYL (DURAGESIC - DOSED MCG/HR) 12 MCG/HR PLACE 1 PATCH ONTO THE SKIN EVERY 3 DAYSAS DIRECTED 10 patch 0 N/A at N/A  . finasteride (PROSCAR) 5 MG tablet TAKE 1 TABLET BY MOUTH ONCE DAILY 90 tablet 3 N/A at N/A  . fluticasone (FLONASE) 50 MCG/ACT nasal spray Place 2 sprays into both nostrils daily.   N/A at N/A  . furosemide (LASIX) 20 MG tablet TAKE 1 TABLET BY MOUTH ONCE DAILY 30 tablet 5 N/A at N/A  . glycopyrrolate (ROBINUL) 1 MG tablet Take 1 tablet (1 mg total) by mouth 3 (three) times daily. 90 tablet 1 N/A at N/A  . loperamide (IMODIUM A-D) 2 MG tablet Take 2 mg by mouth every 6 (six) hours as needed for diarrhea or loose stools.    PRN at PRN  . magnesium gluconate (MAGONATE) 500 MG tablet  Take 500 mg by mouth 2 (two) times daily.   N/A at N/A  . Melatonin 3 MG TABS Take 3 mg by mouth at bedtime.   N/A at N/A  . omeprazole (PRILOSEC) 20 MG capsule TAKE 1 CAPSULE BY MOUTH ONCE DAILY 30 capsule 5 N/A at N/A  . oxyCODONE (OXY IR/ROXICODONE) 5 MG immediate release tablet TAKE 1/2 TO 1 TABLET BY MOUTH EVERY 4 HOURS AS NEEDED FOR SEVERE BREAKTHROUGH PAIN. (Patient taking differently: TAKE 1/2 TABLET BY MOUTH EVERY 4 HOURS AS NEEDED FOR SEVERE BREAKTHROUGH PAIN.) 30 tablet 0 PRN at PRN  . potassium chloride (K-DUR) 10 MEQ tablet TAKE 1 TABLET BY MOUTH ONCE DAILY WITH FUROSEMIDE (LASIX) 30 tablet 3 N/A at N/A  . promethazine (PHENERGAN) 25 MG tablet Take 25 mg by mouth every 6 (six) hours as needed.    PRN  at PRN  . QUEtiapine (SEROQUEL) 50 MG tablet TAKE ONE TABLET BY MOUTH AT BEDTIME 30 tablet 2 N/A at N/A  . Rotigotine (NEUPRO) 3 MG/24HR PT24 Place 1 patch onto the skin daily. 30 patch 0 N/A at N/A  . tamsulosin (FLOMAX) 0.4 MG CAPS capsule TAKE 2 CAPSULES BY MOUTH ONCE DAILY 60 capsule 5 N/A at N/A  . Hyoscyamine Sulfate SL (LEVSIN/SL) 0.125 MG SUBL Place 0.125 mg under the tongue every 4 (four) hours as needed (for secretions). (Patient not taking: Reported on 02/23/2017) 120 each 1 -- at --  . pramipexole (MIRAPEX) 0.5 MG tablet Take 0.5 mg by mouth at bedtime.   N/A at N/A   Scheduled:  . heparin  5,000 Units Subcutaneous Q8H  . sodium chloride flush  3 mL Intravenous Q12H   Assessment: Pharmacy consulted  To dose and monitor merrem in this 77 year old male being treated for possible urinary tract infection  Goal of Therapy:    Plan:  Will start merrem 500 mg IV q24 hours.   Gill Delrossi D 02/23/2017,10:38 AM

## 2017-02-24 DIAGNOSIS — Z7189 Other specified counseling: Secondary | ICD-10-CM

## 2017-02-24 DIAGNOSIS — Z515 Encounter for palliative care: Secondary | ICD-10-CM

## 2017-02-24 DIAGNOSIS — I959 Hypotension, unspecified: Secondary | ICD-10-CM

## 2017-02-24 DIAGNOSIS — D72829 Elevated white blood cell count, unspecified: Secondary | ICD-10-CM

## 2017-02-24 DIAGNOSIS — N179 Acute kidney failure, unspecified: Secondary | ICD-10-CM

## 2017-02-24 DIAGNOSIS — L8990 Pressure ulcer of unspecified site, unspecified stage: Secondary | ICD-10-CM

## 2017-02-24 DIAGNOSIS — R4182 Altered mental status, unspecified: Secondary | ICD-10-CM

## 2017-02-24 LAB — COMPREHENSIVE METABOLIC PANEL
ALBUMIN: 2.3 g/dL — AB (ref 3.5–5.0)
ALK PHOS: 85 U/L (ref 38–126)
ALT: 6 U/L — AB (ref 17–63)
AST: 18 U/L (ref 15–41)
Anion gap: 11 (ref 5–15)
BILIRUBIN TOTAL: 1.1 mg/dL (ref 0.3–1.2)
BUN: 53 mg/dL — AB (ref 6–20)
CALCIUM: 8.1 mg/dL — AB (ref 8.9–10.3)
CO2: 20 mmol/L — ABNORMAL LOW (ref 22–32)
Chloride: 114 mmol/L — ABNORMAL HIGH (ref 101–111)
Creatinine, Ser: 2.14 mg/dL — ABNORMAL HIGH (ref 0.61–1.24)
GFR calc Af Amer: 32 mL/min — ABNORMAL LOW (ref >60)
GFR calc non Af Amer: 28 mL/min — ABNORMAL LOW (ref >60)
GLUCOSE: 107 mg/dL — AB (ref 65–99)
Potassium: 3.4 mmol/L — ABNORMAL LOW (ref 3.5–5.1)
Sodium: 145 mmol/L (ref 135–145)
TOTAL PROTEIN: 5.7 g/dL — AB (ref 6.5–8.1)

## 2017-02-24 LAB — CBC
HCT: 31.5 % — ABNORMAL LOW (ref 40.0–52.0)
Hemoglobin: 10.4 g/dL — ABNORMAL LOW (ref 13.0–18.0)
MCH: 29.1 pg (ref 26.0–34.0)
MCHC: 33 g/dL (ref 32.0–36.0)
MCV: 88.2 fL (ref 80.0–100.0)
Platelets: 166 10*3/uL (ref 150–440)
RBC: 3.57 MIL/uL — ABNORMAL LOW (ref 4.40–5.90)
RDW: 15.5 % — ABNORMAL HIGH (ref 11.5–14.5)
WBC: 14.1 10*3/uL — ABNORMAL HIGH (ref 3.8–10.6)

## 2017-02-24 LAB — MAGNESIUM: MAGNESIUM: 1.7 mg/dL (ref 1.7–2.4)

## 2017-02-24 LAB — PHOSPHORUS: PHOSPHORUS: 2.9 mg/dL (ref 2.5–4.6)

## 2017-02-24 MED ORDER — SODIUM CHLORIDE 0.9 % IV SOLN
1.0000 g | Freq: Two times a day (BID) | INTRAVENOUS | Status: DC
  Administered 2017-02-24: 1 g via INTRAVENOUS
  Filled 2017-02-24 (×2): qty 1

## 2017-02-24 MED ORDER — ADULT MULTIVITAMIN W/MINERALS CH: 1.0000 | ORAL_TABLET | Freq: Every day | ORAL | Status: DC

## 2017-02-24 MED ORDER — PNEUMOCOCCAL VAC POLYVALENT 25 MCG/0.5ML IJ INJ: 0.5000 mL | INJECTION | INTRAMUSCULAR | Status: DC

## 2017-02-24 MED ORDER — DEXTROSE 5 % IV SOLN
2.0000 g | INTRAVENOUS | Status: DC
  Administered 2017-02-24 – 2017-02-25 (×2): 2 g via INTRAVENOUS
  Filled 2017-02-24 (×3): qty 2

## 2017-02-24 NOTE — Progress Notes (Addendum)
ANTIBIOTIC CONSULT NOTE - INITIAL  Pharmacy Consult for ceftriaxone Indication: UTI and bacteremia   No Known Allergies  Patient Measurements: Height: 6' (182.9 cm) Weight: 161 lb 9.6 oz (73.3 kg) IBW/kg (Calculated) : 77.6 Adjusted Body Weight:   Vital Signs: Temp: 100.3 F (37.9 C) (01/21 0400) Temp Source: Axillary (01/21 0400) BP: 104/55 (01/21 0700) Pulse Rate: 83 (01/21 0700) Intake/Output from previous day: 01/20 0701 - 01/21 0700 In: 1116.6 [I.V.:1066.6; IV Piggyback:50] Out: 1950 [Urine:1950] Intake/Output from this shift: No intake/output data recorded.  Labs: Recent Labs    02/23/17 0449 02/24/17 0503  WBC 23.5* 14.1*  HGB 12.8* 10.4*  PLT 133* 166  CREATININE 5.06* 2.14*   Estimated Creatinine Clearance: 29.5 mL/min (A) (by C-G formula based on SCr of 2.14 mg/dL (H)). No results for input(s): VANCOTROUGH, VANCOPEAK, VANCORANDOM, GENTTROUGH, GENTPEAK, GENTRANDOM, TOBRATROUGH, TOBRAPEAK, TOBRARND, AMIKACINPEAK, AMIKACINTROU, AMIKACIN in the last 72 hours.   Microbiology: Recent Results (from the past 720 hour(s))  Culture, blood (routine x 2)     Status: None (Preliminary result)   Collection Time: 02/23/17  4:49 AM  Result Value Ref Range Status   Specimen Description   Final    BLOOD LEFT FOREARM Performed at [facility], [address], [place], [place] [address]    Special Requests   Final    BOTTLES DRAWN AEROBIC AND ANAEROBIC Blood Culture adequate volume Performed at [facility], [address], [place], [place] [address]    Culture  Setup Time   Final    GRAM NEGATIVE RODS IN BOTH AEROBIC AND ANAEROBIC BOTTLES CRITICAL RESULT CALLED TO, READ BACK BY AND VERIFIED WITH: DAVID BESANTI AT 2250 02/23/17.PMH CRITICAL VALUE NOTED.  VALUE IS CONSISTENT WITH PREVIOUSLY REPORTED AND CALLED VALUE. Performed at [facility], [address], [place], [place] [address]    Culture GRAM NEGATIVE RODS  Final   Report Status  PENDING  Incomplete  Culture, blood (routine x 2)     Status: None (Preliminary result)   Collection Time: 02/23/17  4:49 AM  Result Value Ref Range Status   Specimen Description   Final    BLOOD LEFT HAND Performed at [facility], [address] [place], [place] [address]    Special Requests   Final    BOTTLES DRAWN AEROBIC AND ANAEROBIC Blood Culture adequate volume Performed at [facility], [address], [place], [place] [address]    Culture  Setup Time   Final    GRAM NEGATIVE RODS IN BOTH AEROBIC AND ANAEROBIC BOTTLES CRITICAL RESULT CALLED TO, READ BACK BY AND VERIFIED WITH: DAVID BESANTI AT 2250 02/23/17.PMH    Culture GRAM NEGATIVE RODS  Final   Report Status PENDING  Incomplete  Blood Culture ID Panel (Reflexed)     Status: Abnormal   Collection Time: 02/23/17  4:49 AM  Result Value Ref Range Status   Enterococcus species NOT DETECTED NOT DETECTED Final   Listeria monocytogenes NOT DETECTED NOT DETECTED Final   Staphylococcus species NOT DETECTED NOT DETECTED Final   Staphylococcus aureus NOT DETECTED NOT DETECTED Final   Streptococcus species NOT DETECTED NOT DETECTED Final   Streptococcus agalactiae NOT DETECTED NOT DETECTED Final   Streptococcus pneumoniae NOT DETECTED NOT DETECTED Final   Streptococcus pyogenes NOT DETECTED NOT DETECTED Final   Acinetobacter baumannii NOT DETECTED NOT DETECTED Final   Enterobacteriaceae species DETECTED (A) NOT DETECTED Final    Comment: Enterobacteriaceae represent a large family of gram-negative bacteria, not a single  organism. CRITICAL RESULT CALLED TO, READ BACK BY AND VERIFIED WITH: DAVID BESANTI AT 2250 02/23/17.PMH    Enterobacter cloacae complex NOT DETECTED NOT DETECTED Final   Escherichia coli NOT DETECTED NOT DETECTED Final   Klebsiella oxytoca NOT DETECTED NOT DETECTED Final   Klebsiella pneumoniae NOT DETECTED NOT DETECTED Final   Proteus species DETECTED (A) NOT DETECTED Final    Comment:  CRITICAL RESULT CALLED TO, READ BACK BY AND VERIFIED WITH: DAVID BESANTI AT 2250 02/23/17.PMH    Serratia marcescens NOT DETECTED NOT DETECTED Final   Carbapenem resistance NOT DETECTED NOT DETECTED Final   Haemophilus influenzae NOT DETECTED NOT DETECTED Final   Neisseria meningitidis NOT DETECTED NOT DETECTED Final   Pseudomonas aeruginosa NOT DETECTED NOT DETECTED Final   Candida albicans NOT DETECTED NOT DETECTED Final   Candida glabrata NOT DETECTED NOT DETECTED Final   Candida krusei NOT DETECTED NOT DETECTED Final   Candida parapsilosis NOT DETECTED NOT DETECTED Final   Candida tropicalis NOT DETECTED NOT DETECTED Final    Comment: Performed at [facility], [address] [place], [place] [address]  MRSA PCR Screening     Status: None   Collection Time: 02/23/17  9:01 AM  Result Value Ref Range Status   MRSA by PCR NEGATIVE NEGATIVE Final    Comment:        The GeneXpert MRSA Assay (FDA approved for NASAL specimens only), is one component of a comprehensive MRSA colonization surveillance program. It is not intended to diagnose MRSA infection nor to guide or monitor treatment for MRSA infections. Performed at [facility], [address] [place], [place] [address]     Medical History: Past Medical History:  Diagnosis Date  . Allergy   . BPH (benign prostatic hyperplasia)    prev followed by Dr. Evelene Croon  . Edema   . GERD (gastroesophageal reflux disease)   . Hyperlipidemia   . Hypertension   . Parkinson's disease (HCC)   . Renal stones   . Tremor     Medications:  Medications Prior to Admission  Medication Sig Dispense Refill Last Dose  . atenolol (TENORMIN) 25 MG tablet TAKE 1 TABLET BY MOUTH TWICE A DAY 180 tablet 3 N/A at N/A  . benztropine (COGENTIN) 0.5 MG tablet TAKE TWO TABLETS BY MOUTH TWICE A DAY 120 tablet 5 N/A at N/A  . Carbidopa 25 MG tablet TAKE 1 TABLET BY MOUTH 3 TIMES DAILY 90 tablet 3 N/A at N/A  . carbidopa-levodopa  (SINEMET IR) 25-100 MG tablet Take 1.5 tablets by mouth 3 (three) times daily.   N/A at N/A  . docusate sodium (COLACE) 100 MG capsule Take 100 mg by mouth daily.   N/A at N/A  . donepezil (ARICEPT) 5 MG tablet Take 5 mg by mouth at bedtime.   N/A at N/A  . fentaNYL (DURAGESIC - DOSED MCG/HR) 12 MCG/HR PLACE 1 PATCH ONTO THE SKIN EVERY 3 DAYSAS DIRECTED 10 patch 0 N/A at N/A  . finasteride (PROSCAR) 5 MG tablet TAKE 1 TABLET BY MOUTH ONCE DAILY 90 tablet 3 N/A at N/A  . fluticasone (FLONASE) 50 MCG/ACT nasal spray Place 2 sprays into both nostrils daily.   N/A at N/A  . furosemide (LASIX) 20 MG tablet TAKE 1 TABLET BY MOUTH ONCE DAILY 30 tablet 5 N/A at N/A  . glycopyrrolate (ROBINUL) 1 MG tablet Take 1 tablet (1 mg total) by mouth 3 (three) times daily. 90 tablet 1 N/A at N/A  . loperamide (IMODIUM A-D)  2 MG tablet Take 2 mg by mouth every 6 (six) hours as needed for diarrhea or loose stools.    PRN at PRN  . magnesium gluconate (MAGONATE) 500 MG tablet Take 500 mg by mouth 2 (two) times daily.   N/A at N/A  . Melatonin 3 MG TABS Take 3 mg by mouth at bedtime.   N/A at N/A  . omeprazole (PRILOSEC) 20 MG capsule TAKE 1 CAPSULE BY MOUTH ONCE DAILY 30 capsule 5 N/A at N/A  . oxyCODONE (OXY IR/ROXICODONE) 5 MG immediate release tablet TAKE 1/2 TO 1 TABLET BY MOUTH EVERY 4 HOURS AS NEEDED FOR SEVERE BREAKTHROUGH PAIN. (Patient taking differently: TAKE 1/2 TABLET BY MOUTH EVERY 4 HOURS AS NEEDED FOR SEVERE BREAKTHROUGH PAIN.) 30 tablet 0 PRN at PRN  . potassium chloride (K-DUR) 10 MEQ tablet TAKE 1 TABLET BY MOUTH ONCE DAILY WITH FUROSEMIDE (LASIX) 30 tablet 3 N/A at N/A  . promethazine (PHENERGAN) 25 MG tablet Take 25 mg by mouth every 6 (six) hours as needed.    PRN at PRN  . QUEtiapine (SEROQUEL) 50 MG tablet TAKE ONE TABLET BY MOUTH AT BEDTIME 30 tablet 2 N/A at N/A  . Rotigotine (NEUPRO) 3 MG/24HR PT24 Place 1 patch onto the skin daily. 30 patch 0 N/A at N/A  . tamsulosin (FLOMAX) 0.4 MG CAPS  capsule TAKE 2 CAPSULES BY MOUTH ONCE DAILY 60 capsule 5 N/A at N/A  . Hyoscyamine Sulfate SL (LEVSIN/SL) 0.125 MG SUBL Place 0.125 mg under the tongue every 4 (four) hours as needed (for secretions). (Patient not taking: Reported on 02/23/2017) 120 each 1 -- at --  . pramipexole (MIRAPEX) 0.5 MG tablet Take 0.5 mg by mouth at bedtime.   N/A at N/A   Scheduled:  . chlorhexidine  15 mL Mouth Rinse BID  . heparin  5,000 Units Subcutaneous Q8H  . mouth rinse  15 mL Mouth Rinse q12n4p  . pantoprazole (PROTONIX) IV  40 mg Intravenous Q24H  . sodium chloride flush  3 mL Intravenous Q12H   Assessment: Pharmacy consulted  To dose and monitor merrem in this 77 year old male being treated for possible urinary tract infection.   1/21 BCID now showing Proteus    Plan:  1/21 Meropenem changed to Ceftriaxone 2g IV every 24 hours.   Gardner CandleSheema M Masako Overall, PharmD, BCPS Clinical Pharmacist 02/24/2017 8:05 AM

## 2017-02-24 NOTE — Progress Notes (Signed)
   02/24/17 1045  Clinical Encounter Type  Visited With Patient;Health care provider;Other (Comment) (Patient unable to engage so chaplain spoke to patient nurse)  Visit Type Initial;Other (Comment) (doctor requested advanced directive consult)  Referral From Physician   Chaplain responded to order about advanced directive; due to diagnosis, patient unable to engage with chaplain or complete directive; conversation with staff regarding parameters of advanced directives and hierarchy of decision makers when there are no advanced directives; chaplain to try to connect with family members to discuss as well.

## 2017-02-24 NOTE — Clinical Social Work Note (Signed)
Clinical Social Work Assessment  Patient Details  Name: Hunter Willis MRN: [id number] Date of Birth: 08/08/1940  Date of referral:  02/24/17               Reason for consult:  Other (Comment Required)(From [facility] )                Permission sought to share information with:  Oceanographeracility Contact Representative Permission granted to share information::  Yes, Verbal Permission Granted  [facility]  Agency::   Skilled Nursing Facility   Relationship::     Contact Information:     Housing/Transportation Living arrangements for the past 2 months:  Skilled Building surveyorursing Facility Source of Information:  Other (Comment Required), Siblings(Sister in Mudloggerlaw Janice. ) Patient Interpreter Needed:  None Criminal Activity/Legal Involvement Pertinent to Current Situation/Hospitalization:  No - Comment as needed Significant Relationships:  Spouse, Other Family Members, Siblings Lives with:  Facility Resident Do you feel safe going back to the place where you live?  Yes Need for family participation in patient care:  Yes (Comment)  Care giving concerns:  Patient is a long term care SNF resident at [facility].    Social Worker assessment / plan:  Visual merchandiserClinical Social Worker (CSW) received verbal consult from MD that patient will D[facility] back [facility]. [facility] patient is a long term care resident and can return with hospice care. Per chart patient is not alert and oriented so CSW contacted patient's sister Hunter Willis and spoke with patient's sister in law [place]. Per Liborio NixonJanice patient's wife Hunter Willis is also a long term care resident at [facility] and they share a room. Per Liborio NixonJanice she is agreeable for patient to return to [facility] and [facility]. Per Liborio NixonJanice she will contact the rest of patient's family and make them aware of above. CS[facility] aware of  referral. CSW will continue to follow and assist as needed.   Employment status:  Retired Database administratornsurance information:  Managed Medicare PT Recommendations:  Not assessed at this time Information / Referral to community resources:  Skilled Nursing Facility  Patient/Family's Response to care:  Patient's sister in Social workerlaw is agreeable for patient to return to [facility] with hospice.   Patient/Family's Understanding of and Emotional Response to Diagnosis, Current Treatment, and Prognosis:  Patient's sister in law was very pleasant and thanked CSW for assistance.   Emotional Assessment Appearance:  Appears stated age Attitude/Demeanor/Rapport:  Unable to Assess Affect (typically observed):  Unable to Assess Orientation:  Oriented to Self, Fluctuating Orientation (Suspected and/or reported Sundowners) Alcohol / Substance use:  Not Applicable Psych involvement (Current and /or in the community):  No (Comment)  Discharge Needs  Concerns to be addressed:  Discharge Planning Concerns Readmission within the last 30 days:  No Current discharge risk:  Dependent with Mobility, Chronically ill Barriers to Discharge:  Continued Medical Work up   [employer], Darleen CrockerBailey M, LCSW 02/24/2017, 5:25 PM

## 2017-02-24 NOTE — Progress Notes (Signed)
Tried calling family to ask about Parkinsons meds no answer and mailbox full,

## 2017-02-24 NOTE — Progress Notes (Signed)
1        [facility] [place] at [facility]   PATIENT NAME: Hunter PicketJerry Scrogham    MR#:  [id number]  DATE OF BIRTH:  11/18/1940  SUBJECTIVE:  CHIEF COMPLAINT:   Chief Complaint  Patient presents with  . Altered Mental Status  Confused, at baseline REVIEW OF SYSTEMS:  Review of Systems  Unable to perform ROS: Critical illness   DRUG ALLERGIES:  No Known Allergies VITALS:  Blood pressure (!) 115/56, pulse 74, temperature 98.9 F (37.2 C), temperature source Axillary, resp. rate (!) 21, height 6' (1.829 m), weight 73.3 kg (161 lb 9.6 oz), SpO2 98 %. PHYSICAL EXAMINATION:  Physical Exam  Constitutional: He appears malnourished and dehydrated. He appears unhealthy. He appears cachectic. He appears toxic. He has a sickly appearance.  HENT:  Head: Normocephalic and atraumatic.  Eyes: Conjunctivae and EOM are normal. Pupils are equal, round, and reactive to light.  Neck: Normal range of motion. Neck supple. No tracheal deviation present. No thyromegaly present.  Cardiovascular: Normal rate, regular rhythm and normal heart sounds.  Pulmonary/Chest: Effort normal and breath sounds normal. No respiratory distress. He has no wheezes. He exhibits no tenderness.  Abdominal: Soft. Bowel sounds are normal. He exhibits no distension. There is no tenderness.  Genitourinary:  Genitourinary Comments: Foley catheter in place  Musculoskeletal: Normal range of motion.  Neurological: He is alert. He is disoriented. No cranial nerve deficit.  Skin: Skin is warm and dry. No rash noted.  Psychiatric: His affect is blunt.   LABORATORY PANEL:  Male CBC Recent Labs  Lab 02/24/17 0503  WBC 14.1*  HGB 10.4*  HCT 31.5*  PLT 166   ------------------------------------------------------------------------------------------------------------------ Chemistries  Recent Labs  Lab 02/24/17 0503  NA 145  K 3.4*  CL 114*  CO2 20*  GLUCOSE 107*  BUN 53*  CREATININE 2.14*  CALCIUM 8.1*    MG 1.7  AST 18  ALT 6*  ALKPHOS 85  BILITOT 1.1   RADIOLOGY:  No results found. ASSESSMENT AND PLAN:  [age] M SNF resident with severe chronic debilitation due to advanced Parkinson's and dementia.  At his baseline, he is bed bound, has chronic Foley catheter, requires assistance with feeding and is very poorly oriented, not recognizing family members consistently.  He was admitted for severe sepsis/septic shock due to urinary tract infection likely caused by malpositioned Foley catheter.  * Septic shock secondary to chronic Foley catheter related UTI with urinary retention -This was bladder outlet obstruction due to Foley malposition -Foley replaced -Continue IV Rocephin -Urine and blood cultures pending  *Acute kidney injury due to urinary retention and septic shock.  ATN and post obstructive -Creatinine improving status post change in Foley from 5.06->2.14  *Urethral stones - There are 2 stones within urethra which, if catheter is properly replaced do not currently need mgmt for urology. - - Stone debris in bladder as well. Bilateral renal stones, R>L. No ureteral stones or hydronephrosis. -Foley catheter changed while in ICU today   *Acute toxic and metabolic encephalopathy with baseline dementia.  CT head shows nothing acute. Due to acute kidney injury and sepsis.  Monitor.    He is DNR.  Await palliative care consultation.  He was under hospice care about a year ago.  He does look appropriate for hospice to me.   All the records are reviewed and case discussed with Care Management/Social Worker. Management plans discussed with the patient, nursing, PCCM and they are in agreement.  CODE STATUS: DNR  TOTAL TIME TAKING CARE OF THIS PATIENT: 35 minutes.   More than 50% of the time was spent in counseling/coordination of care: YES  POSSIBLE D/C IN 1-2 DAYS, DEPENDING ON CLINICAL CONDITION.   Delfino Lovett M.D on 02/24/2017 at 2:46 PM  Between 7am to 6pm - Pager -  [phone]  After 6pm go to [email or web address] - password [email or web address] [facility]  [facility] [place] [facility]  Office  [phone]  CC: Primary care physician; Joaquim Nam, MD  Note: This dictation was prepared with Dragon dictation along with smaller phrase technology. Any transcriptional errors that result from this process are unintentional.

## 2017-02-24 NOTE — Progress Notes (Signed)
Initial Nutrition Assessment  DOCUMENTATION CODES:   Not applicable  INTERVENTION:  Provide Magic cup TID with meals, each supplement provides 290 kcal and 9 grams of protein.  Provide Hormel Shake BID with lunch and dinner, each supplement provides 520 kcal, 22 grams of protein.  Provide daily MVI.  Continue 1:1 assistance at meals.  Will monitor outcome of discussions regarding goals of care.  NUTRITION DIAGNOSIS:   Increased nutrient needs related to wound healing as evidenced by estimated needs.  GOAL:   Patient will meet greater than or equal to 90% of their needs  MONITOR:   PO intake, Supplement acceptance, Labs, Weight trends, Skin, I & O's  REASON FOR ASSESSMENT:   Low Braden    ASSESSMENT:   77 year old male with PMHx of advanced dementia, Parkinson's disease, HTN, GERD, HLD, BPH now admitted [facility] with severe sepsis/septic shock due to malpositioned Foley catheter and UTI.   -Pending PMT consult.  Patient unable to provide history. No family members at bedside. Discussed over the phone with [facility] who cares for patient regularly. She reports patient is typically on a dysphagia 1 diet with thin liquids. He is bed-bound and has a chronic Foley catheter. He requires full assistance with meals. RD also noted patient was leaned over very far to the right in the bed. RN from [facility] reports that is his baseline.  Most recent weight in chart is 209.8 lbs on 11/15/2015. Patient has lost 48.2 lbs (23% body weight) over 1 year and 3 months.  Medications reviewed and include: pantoprazole, ceftriaxone, LR @ 50 mL/hr, norepinephrine gtt now off.  Labs reviewed: CBG 166, Potassium 3.4, Chloride 114, CO2 20, BUN 53, Creatinine 2.14.  Patient is at risk for malnutrition.  Discussed with RN and on rounds.  NUTRITION - FOCUSED PHYSICAL EXAM:    Most Recent Value  Orbital Region  No depletion  Upper Arm Region  Mild  depletion  Thoracic and Lumbar Region  No depletion  Buccal Region  No depletion  Temple Region  No depletion  Clavicle Bone Region  Mild depletion  Clavicle and Acromion Bone Region  Mild depletion  Scapular Bone Region  Mild depletion  Dorsal Hand  No depletion  Patellar Region  Moderate depletion  Anterior Thigh Region  Moderate depletion  Posterior Calf Region  Moderate depletion  Edema (RD Assessment)  -- [non-pitting to bilateral lower extremities]  Hair  Reviewed  Eyes  Unable to assess  Mouth  Unable to assess  Skin  Reviewed  Nails  Reviewed     Diet Order:  DIET - DYS 1 Room service appropriate? Yes; Fluid consistency: Thin  EDUCATION NEEDS:   Not appropriate for education at this time  Skin:  Skin Assessment: Skin Integrity Issues: Skin Integrity Issues:: Stage I, Stage II Stage I: b/l buttocks Stage II: b/l buttocks  Last BM:  02/23/2017 - medium type 6  Height:   Ht Readings from Last 1 Encounters:  02/23/17 6' (1.829 m)    Weight:   Wt Readings from Last 1 Encounters:  02/23/17 161 lb 9.6 oz (73.3 kg)    Ideal Body Weight:  80.9 kg  BMI:  Body mass index is 21.92 kg/m.  Estimated Nutritional Needs:   Kcal:  1800-2100 (MSJ x 1.2-1.4)  Protein:  90-100 grams (1.2-1.4 grams/kg)  Fluid:  1.8 L/day (25 mL/kg)  Helane RimaLeanne Bernadetta Roell, MS, RD, LDN Office: [phone] Pager: [phone] After Hours/Weekend Pager: [phone]

## 2017-02-24 NOTE — [place] FL2 (Signed)
[place] MEDICAID FL2 LEVEL OF CARE SCREENING TOOL     IDENTIFICATION  Patient Name: Hunter Willis Birthdate: 04/23/1940 Sex: male Admission Date (Current Location): 02/23/2017  [place]   Facility and Address:  [facility], [address], [place], [place] [address]      Provider Number: [id number]  Attending Physician Name and Address:  Delfino LovettShah, Vipul, MD  Relative Name and Phone Number:       Current Level of Care: Hospital Recommended Level of Care: Skilled Nursing Facility Prior Approval Number:    Date Approved/Denied:   PASRR Number: ([id number] )  Discharge Plan: SNF    Current Diagnoses: Patient Active Problem List   Diagnosis Date Noted  . Pressure injury of skin 02/24/2017  . Septic shock (HCC) 02/23/2017  . Encounter for chronic pain management 11/04/2016  . Hospice care patient 02/13/2016  . FTT (failure to thrive) in adult 08/25/2015  . Has a tremor 07/06/2015  . HLD (hyperlipidemia) 07/06/2015  . Fall 05/28/2015  . Carotid artery calcification 02/08/2015  . Constipation 02/08/2015  . Lower urinary tract symptoms (LUTS) 12/01/2014  . Pressure ulcer 11/25/2014  . Neck pain 10/14/2014  . Excessive salivation 10/21/2013  . Advance care planning 09/05/2013  . Closed hip fracture (HCC) 05/31/2013  . Femoral neck fracture (HCC) 05/30/2013  . BP (high blood pressure) 05/03/2013  . Blood glucose elevated 05/03/2013  . Acid reflux 05/03/2013  . Benign essential tremor 05/03/2013  . Hyperglycemia 08/10/2012  . Hyperlipidemia 05/31/2011  . Medicare annual wellness visit, subsequent 05/31/2011  . Back pain 01/04/2011  . Parkinson's disease (HCC) 05/21/2010  . UNSPECIFIED VITAMIN D DEFICIENCY 08/24/2008  . Essential hypertension 07/23/2006  . Allergic rhinitis 07/23/2006  . EROSIVE ESOPHAGITIS 07/23/2006  . GERD 07/23/2006  . RENAL CALCULUS, RECURRENT 07/23/2006  . BPH (benign prostatic  hyperplasia) 07/23/2006    Orientation RESPIRATION BLADDER Height & Weight     Self  Normal Continent Weight: 161 lb 9.6 oz (73.3 kg) Height:  6' (182.9 cm)  BEHAVIORAL SYMPTOMS/MOOD NEUROLOGICAL BOWEL NUTRITION STATUS      Incontinent Diet(Diet: DYS 1 )  AMBULATORY STATUS COMMUNICATION OF NEEDS Skin   Total Care Verbally PU Stage and Appropriate Care(pressure ulcer stage 2 on buttocks.  )                       Personal Care Assistance Level of Assistance  Bathing, Feeding, Dressing Bathing Assistance: Maximum assistance Feeding assistance: Maximum assistance Dressing Assistance: Maximum assistance     Functional Limitations Info  Sight, Hearing, Speech Sight Info: Adequate Hearing Info: Adequate Speech Info: Impaired    SPECIAL CARE FACTORS FREQUENCY  (Hospice Care. )                    Contractures      Additional Factors Info  Code Status, Allergies Code Status Info: (DNR ) Allergies Info: (No Known Allergies. )           Current Medications (02/24/2017):  This is the current hospital active medication list Current Facility-Administered Medications  Medication Dose Route Frequency Provider Last Rate Last Dose  . acetaminophen (TYLENOL) tablet 650 mg  650 mg Oral Q6H PRN Milagros LollSudini, Srikar, MD       Or  . acetaminophen (TYLENOL) suppository 650 mg  650 mg Rectal Q6H PRN Sudini, Srikar, MD      . albuterol (PROVENTIL) (2.5 MG/3ML) 0.083% nebulizer solution 2.5 mg  2.5 mg Nebulization Q2H PRN Milagros Loll, MD      . bisacodyl (DULCOLAX) suppository 10 mg  10 mg Rectal Daily PRN Sudini, Wardell Heath, MD      . cefTRIAXone (ROCEPHIN) 2 g in dextrose 5 % 50 mL IVPB  2 g Intravenous Q24H Hallaji, Sheema M, RPH      . chlorhexidine (PERIDEX) 0.12 % solution 15 mL  15 mL Mouth Rinse BID Merwyn Katos, MD   15 mL at 02/24/17 1018  . heparin injection 5,000 Units  5,000 Units Subcutaneous Q8H Milagros Loll, MD   5,000 Units at 02/24/17 1505  . MEDLINE mouth rinse   15 mL Mouth Rinse q12n4p Merwyn Katos, MD      . Melene Muller ON 02/25/2017] multivitamin with minerals tablet 1 tablet  1 tablet Oral Daily Erin Fulling, MD      . ondansetron (ZOFRAN) injection 4 mg  4 mg Intravenous Q6H PRN Milagros Loll, MD   4 mg at 02/23/17 2245  . pantoprazole (PROTONIX) injection 40 mg  40 mg Intravenous Q24H Tukov, Magadalene S, NP   40 mg at 02/24/17 0000  . [START ON 02/25/2017] pneumococcal 23 valent vaccine (PNU-IMMUNE) injection 0.5 mL  0.5 mL Intramuscular Tomorrow-1000 Kasa, Kurian, MD      . polyethylene glycol (MIRALAX / GLYCOLAX) packet 17 g  17 g Oral Daily PRN Sudini, Srikar, MD      . sodium chloride flush (NS) 0.9 % injection 3 mL  3 mL Intravenous Q12H Sudini, Srikar, MD   3 mL at 02/24/17 1019     Discharge Medications: Please see discharge summary for a list of discharge medications.  Relevant Imaging Results:  Relevant Lab Results:   Additional Information (SSN: [id number])  Hunter Willis, Darleen Crocker, LCSW

## 2017-02-24 NOTE — Progress Notes (Signed)
Tried Scientist, water qualitycalling [facility] at [phone] to confirm Parkinsons meds that patient is currently taking got voice mail only

## 2017-02-24 NOTE — Progress Notes (Signed)
Pt being transferred to room 117. Report called to Thayer Ohmhris, Charity fundraiserN. Pt and belongings transferred to room 117 without incident.

## 2017-02-24 NOTE — Consult Note (Signed)
Consultation Note Date: 02/24/2017   Patient Name: Hunter Willis  DOB: [birth year]  MRN: [id number]  Age / Sex: 77 y.o., male  PCP: Hunter Nam, MD Referring Physician: Delfino Lovett, MD  Reason for Consultation: Establishing goals of care  HPI/Patient Profile: Admitted for  urinary retention with acute kidney injury and UTI.     Clinical Assessment and Goals of Care: Mr. Hunter Willis is resting in bed sleeping. He does not arouse to voice or touch. Spoke with sister in law Hunter Willis. She states he and his wife both live in the facility, and stay in the same room. She states he has been residing there for the past 2 months, and his wife was moved in approx 3 months prior to him for physical ailments. The family was paying 24 hour around the clock caregivers to stay with him, but could no longer afford this.   Hunter Willis states he has had Parkinsons for years that has gotten worse with age. He has had dementia for approx 3 years, and has gotten much worse over the past year. She states he is usually not alert and sleeps a lot. She states he has become more aggressive and combative, and what he says no longer makes sense. She states he wears diapers and has bedsores. Hunter Willis tells me the bedsores were another deciding factor besides finances that determined their plan for admission.   She states he had hospice in place about a year ago because they thought he was going to die, but was signed off because he was "doing too good."  Per Hunter Willis, his wife is cognizant, and his Management consultant. She states the wife and family have an attorney who is working on alternate [employer] to list one of the family members as POA for both of them should one become neccessary.     Per his wife Hunter Willis, he has stated he wishes the [facility] would come and take him home. She states he is ready to die. She tells me he has been a  Hospice patient, but was  released as he was doing too well.     She requests that he be tucked in at facility at not be brought back to the hospital, and would like to stop the aggressive care, and to keep him comfortable for the time he has left here on earth.   Unable to complete MOST form as family GOC was via phone.   SUMMARY OF RECOMMENDATIONS   Spoke with Hunter Willis and Hunter Willis via phone. Hunter Willis is okay with resuming hospice level care an tucking him in at the facility with her. Husband and wife share a room at the facility. Hunter Willis states Hunter Willis can make decisions on Hunter Willis healthcare.     Code Status/Advance Care Planning:  DNR   Palliative Prophylaxis:   Oral Care  Prognosis:   < 6 months Skin ulcers,  Albumin 2.3, increased amount of sleeping, would not want feeding tube.   Discharge Planning: To Be Determined  Primary Diagnoses: Present on Admission: . Septic shock (HCC)   I have reviewed the medical record, interviewed the patient and family, and examined the patient. The following aspects are pertinent.  Past Medical History:  Diagnosis Date  . Allergy   . BPH (benign prostatic hyperplasia)    prev followed by Dr. Evelene Croon  . Edema   . GERD (gastroesophageal reflux disease)   . Hyperlipidemia   . Hypertension   . Parkinson's disease (HCC)   . Renal stones   . Tremor    Social History   Socioeconomic History  . Marital status: Married    Spouse name: Not on file  . Number of children: 0  . Years of education: Not on file  . Highest education level: Not on file  Social Needs  . Financial resource strain: Not on file  . Food insecurity - worry: Not on file  . Food insecurity - inability: Not on file  . Transportation needs - medical: Not on file  . Transportation needs - non-medical: Not on file  Occupational History  . Occupation: Customer service manager: retired  Tobacco Use  . Smoking status: Never Smoker  Substance and Sexual  Activity  . Alcohol use: No  . Drug use: Not on file  . Sexual activity: Not on file  Other Topics Concern  . Not on file  Social History Narrative   Marital Status: Married 1966   Children: none   Occupation: prev Electrical engineer part-time   Family History  Problem Relation Age of Onset  . Diabetes Mother   . Diabetes Sister   . Cancer Brother        prostate, terminal  . Prostate cancer Brother   . Hypertension Brother   . Hypertension Brother   . Hypertension Brother   . Depression Sister   . Cancer Sister        throat  . Heart disease Father   . Heart disease Brother   . Colon cancer Neg Hx    Scheduled Meds: . chlorhexidine  15 mL Mouth Rinse BID  . heparin  5,000 Units Subcutaneous Q8H  . mouth rinse  15 mL Mouth Rinse q12n4p  . pantoprazole (PROTONIX) IV  40 mg Intravenous Q24H  . [START ON 02/25/2017] pneumococcal 23 valent vaccine  0.5 mL Intramuscular Tomorrow-1000  . sodium chloride flush  3 mL Intravenous Q12H   Continuous Infusions: . lactated ringers 50 mL/hr at 02/24/17 0800  . meropenem (MERREM) IV    . norepinephrine (LEVOPHED) Adult infusion Stopped (02/24/17 0520)   PRN Meds:.acetaminophen **OR** acetaminophen, albuterol, bisacodyl, [DISCONTINUED] ondansetron **OR** ondansetron (ZOFRAN) IV, polyethylene glycol Medications Prior to Admission:  Prior to Admission medications   Medication Sig Start Date End Date Taking? Authorizing Provider  atenolol (TENORMIN) 25 MG tablet TAKE 1 TABLET BY MOUTH TWICE A DAY 10/11/16  Yes Hunter Nam, MD  benztropine (COGENTIN) 0.5 MG tablet TAKE TWO TABLETS BY MOUTH TWICE A DAY 11/13/16  Yes Hunter Nam, MD  Carbidopa 25 MG tablet TAKE 1 TABLET BY MOUTH 3 TIMES DAILY 12/25/16  Yes Hunter Nam, MD  carbidopa-levodopa (SINEMET IR) 25-100 MG tablet Take 1.5 tablets by mouth 3 (three) times daily. 12/01/14  Yes Hunter Nam, MD  docusate sodium (COLACE) 100 MG capsule Take 100 mg by mouth daily.   Yes  [provider]  donepezil (ARICEPT) 5 MG tablet Take 5 mg by mouth at bedtime.   Yes [provider]  fentaNYL (DURAGESIC - DOSED MCG/HR) 12 MCG/HR PLACE 1 PATCH ONTO THE SKIN EVERY 3 DAYSAS DIRECTED 12/06/16  Yes Hunter Nam, MD  finasteride (PROSCAR) 5 MG tablet TAKE 1 TABLET BY MOUTH ONCE DAILY 12/03/16  Yes Hunter Nam, MD  fluticasone [facility]) 50 MCG/ACT nasal spray Place 2 sprays into both nostrils daily. 05/26/15  Yes Hunter Nam, MD  furosemide (LASIX) 20 MG tablet TAKE 1 TABLET BY MOUTH ONCE DAILY 08/26/16  Yes Hunter Nam, MD  glycopyrrolate (ROBINUL) 1 MG tablet Take 1 tablet (1 mg total) by mouth 3 (three) times daily. 10/24/16  Yes Hunter Nam, MD  loperamide (IMODIUM A-D) 2 MG tablet Take 2 mg by mouth every 6 (six) hours as needed for diarrhea or loose stools.    Yes [provider]  magnesium gluconate (MAGONATE) 500 MG tablet Take 500 mg by mouth 2 (two) times daily.   Yes [provider]  Melatonin 3 MG TABS Take 3 mg by mouth at bedtime.   Yes [provider]  omeprazole (PRILOSEC) 20 MG capsule TAKE 1 CAPSULE BY MOUTH ONCE DAILY 09/18/16  Yes Hunter Nam, MD  oxyCODONE (OXY IR/ROXICODONE) 5 MG immediate release tablet TAKE 1/2 TO 1 TABLET BY MOUTH EVERY 4 HOURS AS NEEDED FOR SEVERE BREAKTHROUGH PAIN. Patient taking differently: TAKE 1/2 TABLET BY MOUTH EVERY 4 HOURS AS NEEDED FOR SEVERE BREAKTHROUGH PAIN. 12/31/16  Yes Hunter Nam, MD  potassium chloride (K-DUR) 10 MEQ tablet TAKE 1 TABLET BY MOUTH ONCE DAILY WITH FUROSEMIDE (LASIX) 12/09/16  Yes Hunter Nam, MD  promethazine (PHENERGAN) 25 MG tablet Take 25 mg by mouth every 6 (six) hours as needed.    Yes [provider]  QUEtiapine (SEROQUEL) 50 MG tablet TAKE ONE TABLET BY MOUTH AT BEDTIME 12/18/16  Yes Hunter Nam, MD  Rotigotine (NEUPRO) 3 MG/24HR PT24 Place 1 patch onto the skin daily. 10/29/16  Yes Hunter Nam, MD    tamsulosin (FLOMAX) 0.4 MG CAPS capsule TAKE 2 CAPSULES BY MOUTH ONCE DAILY 08/20/16  Yes Hunter Nam, MD  Hyoscyamine Sulfate SL (LEVSIN/SL) 0.125 MG SUBL Place 0.125 mg under the tongue every 4 (four) hours as needed (for secretions). Patient not taking: Reported on 02/23/2017 07/08/16   Hunter Nam, MD  pramipexole (MIRAPEX) 0.5 MG tablet Take 0.5 mg by mouth at bedtime.    [provider]   No Known Allergies Review of Systems  Unable to perform ROS Eyes: Positive for pain.    Physical Exam  Constitutional: No distress.  Pulmonary/Chest: Effort normal.  Neurological:  Sleeping  Skin:  Boots in place.    Vital Signs: BP (!) 95/48 (BP Location: Left Arm)   Pulse 76   Temp 98.6 F (37 C) (Axillary)   Resp (!) 23   Ht 6' (1.829 m)   Wt 73.3 kg (161 lb 9.6 oz)   SpO2 98%   BMI 21.92 kg/m  Pain Assessment: PAINAD       SpO2: SpO2: 98 % O2 Device:SpO2: 98 % O2 Flow Rate: .O2 Flow Rate (L/min): 2 L/min  IO: Intake/output summary:   Intake/Output Summary (Last 24 hours) at 02/24/2017 1014 Last data filed at 02/24/2017 0800 Willis per 24 hour  Intake 1249.88 ml  Output 1950 ml  Net -700.12 ml    LBM: Last BM Date: 02/23/17 Baseline Weight: Weight: 65.8 kg (145 lb) Most recent weight: Weight: 73.3 kg (161 lb 9.6 oz)  Palliative Assessment/Data: 30%     Time In: 9:00 Time Out: 10:10 Time Total: 70 min Greater than 50%  of this time was spent counseling and coordinating care related to the above assessment and plan.  Signed by: Morton Stallrystal Shanay Woolman, NP   Please contact Palliative Medicine Team phone at [phone] for questions and concerns.  For individual provider: See Loretha StaplerAmion

## 2017-02-24 NOTE — Consult Note (Signed)
PULMONARY / CRITICAL CARE MEDICINE   Name: Hunter Willis MRN: [id number] DOB: 1941/01/02    ADMISSION DATE:  02/23/2017  PT PROFILE:   62 M SNF resident with severe chronic debilitation due to advanced Parkinson's and dementia.  At his baseline, he is bed bound, has chronic Foley catheter, requires assistance with feeding and is very poorly oriented, not recognizing family members consistently.  He was admitted via ED with severe sepsis/septic shock due to urinary tract infection caused by malpositioned Foley catheter.  MAJOR EVENTS/TEST RESULTS: 01/20 admitted from SNF via ED with severe sepsis/septic shock.  Received 4 L NS administered in ED. Low dose NE initiated. Received Vanc and Zosyn 01/20 CT head: Mild chronic small vessel disease. No acute intracranial abnormality 01/20 CT renal stone study: Moderately distended urinary bladder. Air within the urinary bladder and bladder wall may have been introduced via Foley catheter. Emphysematous cystitis is not excluded. The Foley catheter with balloon in the penile urethra recommend further advancing of the catheter into the urinary bladder. There are two stones in the urethra. A 6 x 12 mm stone in the pelvis likely represent an impacted stone at the bladder neck or in the prostatic urethra. Nonobstructing bilateral renal calculi measure up to 11 mm in the upper pole of the right kidney. No hydronephrosis 01/20 Urology consultation: Foley re-positioned  INDWELLING DEVICES:   MICRO DATA: MRSA PCR 01/20 >> NEG Urine 01/20 >>   Blood 01/20 >>   ANTIMICROBIALS:  Vanc/Zosyn X 1 01/20 Meropenem 01/20 >>     HISTORY OF PRESENT ILLNESS:   Patient looks comfortable Off of vasopressors +proteus species in blood cultures  REVIEW OF SYSTEMS:   Unable to obtain    VITAL SIGNS: BP (!) 121/59   Pulse 87   Temp 98.6 F (37 C) (Axillary)   Resp (!) 25   Ht 6' (1.829 m)   Wt 161 lb 9.6 oz (73.3 kg)   SpO2 97%   BMI 21.92 kg/m      INTAKE / OUTPUT: I/O last 3 completed shifts: In: 1166.6 [I.V.:1066.6; IV Piggyback:100] Out: 1950 [Urine:1950]  PHYSICAL EXAMINATION: General: Frail, somnolent, no overt distress Neuro: Pupils react, withdraws from pain, minimal spontaneous movement, nonverbal HEENT: NCAT, sclerae white Cardiovascular: Regular, no M Lungs: Clear anteriorly Abdomen: Soft, NT, + BS Genitourinary: Thick cloudy urine in Foley bag Ext: Warm, no edema Skin: No lesions noted  LABS:  BMET Recent Labs  Lab 02/23/17 0449 02/24/17 0503  NA 141 145  K 3.9 3.4*  CL 105 114*  CO2 22 20*  BUN 60* 53*  CREATININE 5.06* 2.14*  GLUCOSE 157* 107*    Electrolytes Recent Labs  Lab 02/23/17 0449 02/24/17 0503  CALCIUM 9.1 8.1*  MG  --  1.7  PHOS  --  2.9    CBC Recent Labs  Lab 02/23/17 0449 02/24/17 0503  WBC 23.5* 14.1*  HGB 12.8* 10.4*  HCT 39.1* 31.5*  PLT 133* 166    Coag's No results for input(s): APTT, INR in the last 168 hours.  Sepsis Markers Recent Labs  Lab 02/23/17 0449 02/23/17 0647  LATICACIDVEN 3.4* 3.2*    ABG No results for input(s): PHART, PCO2ART, PO2ART in the last 168 hours.   CXR: NAD  PREVIOUS DISCUSSION WITH FAMILY I spoke in detail with his brothers in law who have been close to him and visit him regularly in the SNF. They inform me that the patient has frequently expressed a desire to die. We discussed  goals of care and they are all in agreement that he would not wish to undergo ACLS, intubation/mechanical ventilation or dialysis. We agreed to continue with antibiotics and low dose vasopressors as currently. We will not place CVL, provide any form of ventilatory support or undertake ACLS or hemodialysis. Our principle goal is to maintain his comfort and dignity  ASSESSMENT / PLAN:   DISCUSSION: 77 yo white male with Severe sepsis/septic shock due to UTI/malpositioned Foley catheter Chronicaly and severely debilitated @  baseline   PULMONARY A: No issues P:   Supplemental O2 as needed DNI - see   CARDIOVASCULAR A:  Septic shock P:  MAP goal Norepi (not to exceed 10 mcg/min) as needed to achieve MAP goal No other vasopressors will be initiated Wean off as tolerated  RENAL A:   AKI Bladder outlet obstruction due to Foley malposition Not a candidate for HD - see above P:   Monitor BMET intermittently Monitor I/Os Correct electrolytes as indicated  Maintenance IVFs ordered  GASTROINTESTINAL A:   No issues P:   SUP: N/I Consider nutrition when able Will NOT place any form of feeding tube  HEMATOLOGIC A:   Mild thrombocytopenia P:  DVT px: SQ heparin Monitor CBC intermittently Transfuse per usual guidelines   INFECTIOUS A:   Severe sepsis UTI, severe cystitis P:   +proteus species Change abx to rocpehin  ENDOCRINE A:   Mild stress induced hyperglycemia without hx of DM P:   Monitor glu on chem panels Consider SSI for glu > 180  NEUROLOGIC A:   Parkinson's with advanced dementia Severe debilitation @ baseline  OK to transfer to gen med floor   Keenon Leitzel Santiago Gladavid Saabir Blyth, M.D.  [facility]  Medical Director [facility] Medical Director [facility] Cardio-P[facility] Department

## 2017-02-25 DIAGNOSIS — R748 Abnormal levels of other serum enzymes: Secondary | ICD-10-CM

## 2017-02-25 LAB — CBC
HCT: 31 % — ABNORMAL LOW (ref 40.0–52.0)
HEMOGLOBIN: 10.1 g/dL — AB (ref 13.0–18.0)
MCH: 28.8 pg (ref 26.0–34.0)
MCHC: 32.5 g/dL (ref 32.0–36.0)
MCV: 88.7 fL (ref 80.0–100.0)
Platelets: 129 10*3/uL — ABNORMAL LOW (ref 150–440)
RBC: 3.49 MIL/uL — AB (ref 4.40–5.90)
RDW: 15.5 % — ABNORMAL HIGH (ref 11.5–14.5)
WBC: 12.5 10*3/uL — ABNORMAL HIGH (ref 3.8–10.6)

## 2017-02-25 LAB — URINE CULTURE

## 2017-02-25 LAB — BASIC METABOLIC PANEL
Anion gap: 11 (ref 5–15)
BUN: 46 mg/dL — ABNORMAL HIGH (ref 6–20)
CHLORIDE: 116 mmol/L — AB (ref 101–111)
CO2: 22 mmol/L (ref 22–32)
Calcium: 8.7 mg/dL — ABNORMAL LOW (ref 8.9–10.3)
Creatinine, Ser: 1.55 mg/dL — ABNORMAL HIGH (ref 0.61–1.24)
GFR calc non Af Amer: 41 mL/min — ABNORMAL LOW (ref >60)
GFR, EST AFRICAN AMERICAN: 48 mL/min — AB (ref >60)
Glucose, Bld: 102 mg/dL — ABNORMAL HIGH (ref 65–99)
POTASSIUM: 3.2 mmol/L — AB (ref 3.5–5.1)
SODIUM: 149 mmol/L — AB (ref 135–145)

## 2017-02-25 LAB — MAGNESIUM: Magnesium: 2 mg/dL (ref 1.7–2.4)

## 2017-02-25 MED ORDER — LORAZEPAM 1 MG PO TABS: 1.0000 mg | ORAL_TABLET | ORAL | 0 refills | Status: DC | PRN

## 2017-02-25 MED ORDER — CARBIDOPA-LEVODOPA 25-100 MG PO TABS
1.5000 | ORAL_TABLET | Freq: Three times a day (TID) | ORAL | Status: DC
  Filled 2017-02-25 (×5): qty 1.5

## 2017-02-25 MED ORDER — QUETIAPINE FUMARATE 25 MG PO TABS: 50.0000 mg | ORAL_TABLET | Freq: Every day | ORAL | Status: DC

## 2017-02-25 MED ORDER — DONEPEZIL HCL 5 MG PO TABS
5.0000 mg | ORAL_TABLET | Freq: Every day | ORAL | Status: DC
  Filled 2017-02-25: qty 1

## 2017-02-25 MED ORDER — CEPHALEXIN 250 MG PO CAPS: 250.0000 mg | ORAL_CAPSULE | Freq: Two times a day (BID) | ORAL | 0 refills | Status: DC

## 2017-02-25 MED ORDER — MORPHINE SULFATE 20 MG/5ML PO SOLN: 2.5000 mg | ORAL | 0 refills | Status: DC | PRN

## 2017-02-25 NOTE — Progress Notes (Signed)
Plan is for patient to D/C back to [facility] under long term care with [facility]. Per Tresa EndoKelly admissions coordinator at [facility] patient is medicaid pending and considered under private pay even though the family has not paid money. Per Tresa EndoKelly the family will not have to pay money if patient comes back with hospice because medicaid will retroact and pay since the date the application was filled.  [name] patient's sister in law is aware of above and in agreement with the plan. [facility] liaison Clydie BraunKaren is aware of above.   Baker Hughes IncorporatedBailey Arlyne Brandes, LCSW [phone]

## 2017-02-25 NOTE — Progress Notes (Signed)
Patient refuses to open mouth for mouth care or to eat.  Does not respond to verbal communication. Did state "fuck" when turning and inserting tylenol suppository for fever. Refuses liquid.

## 2017-02-25 NOTE — Discharge Summary (Signed)
[employer] - [place] at [facility]   PATIENT NAME: Hunter Willis    MR#:  [id number]  DATE OF BIRTH:  [birth year]  DATE OF ADMISSION:  02/23/2017   ADMITTING PHYSICIAN: Milagros Loll, MD  DATE OF DISCHARGE: 02/26/2017  PRIMARY CARE PHYSICIAN: Joaquim Nam, MD   ADMISSION DIAGNOSIS:  Elevated troponin [R74.8] Hypotension, unspecified hypotension type [I95.9] Acute renal failure, unspecified acute renal failure type (HCC) [N17.9] Altered mental status, unspecified altered mental status type [R41.82] Leukocytosis, unspecified type [D72.829] Urinary tract infection associated with indwelling urethral catheter, initial encounter (HCC) [T83.511A, N39.0] DISCHARGE DIAGNOSIS:  Active Problems:   Septic shock (HCC)   Pressure injury of skin  SECONDARY DIAGNOSIS:   Past Medical History:  Diagnosis Date  . Allergy   . BPH (benign prostatic hyperplasia)    prev followed by Dr. Evelene Croon  . Edema   . GERD (gastroesophageal reflux disease)   . Hyperlipidemia   . Hypertension   . Parkinson's disease (HCC)   . Renal stones   . Tremor    HOSPITAL COURSE:  77 y.o. male with a known history of Parkinson's, BPH, mild dementia, chronic Foley catheter presents from [place] [facility] with lethargic.  * Septic shock secondary to chronic Foley catheter related UTI with urinary retention  *Acute kidney injury due to urinary retention and septic shock.  ATN and post renal.  *Neck of bladder and ureteral stones.  Foley catheter in place and draining.   *Acute toxic and metabolic encephalopathy with baseline mild dementia.  CT head shows nothing acute.  DISCHARGE CONDITIONS:  critical CONSULTS OBTAINED:   DRUG ALLERGIES:  No Known Allergies DISCHARGE MEDICATIONS:   Allergies as of 02/26/2017   No Known Allergies     Medication List    STOP taking these medications   atenolol 25 MG tablet Commonly known as:  TENORMIN   benztropine 0.5 MG tablet Commonly  known as:  COGENTIN   Carbidopa 25 MG tablet   carbidopa-levodopa 25-100 MG tablet Commonly known as:  SINEMET IR   docusate sodium 100 MG capsule Commonly known as:  COLACE   donepezil 5 MG tablet Commonly known as:  ARICEPT   fentaNYL 12 MCG/HR Commonly known as:  DURAGESIC - dosed mcg/hr   finasteride 5 MG tablet Commonly known as:  PROSCAR   fluticasone 50 MCG/ACT nasal spray Commonly known as:  FLONASE   furosemide 20 MG tablet Commonly known as:  LASIX   loperamide 2 MG tablet Commonly known as:  IMODIUM A-D   magnesium gluconate 500 MG tablet Commonly known as:  MAGONATE   Melatonin 3 MG Tabs   omeprazole 20 MG capsule Commonly known as:  PRILOSEC   oxyCODONE 5 MG immediate release tablet Commonly known as:  Oxy IR/ROXICODONE   potassium chloride 10 MEQ tablet Commonly known as:  K-DUR   pramipexole 0.5 MG tablet Commonly known as:  MIRAPEX   promethazine 25 MG tablet Commonly known as:  PHENERGAN   QUEtiapine 50 MG tablet Commonly known as:  SEROQUEL   Rotigotine 3 MG/24HR Pt24 Commonly known as:  NEUPRO   tamsulosin 0.4 MG Caps capsule Commonly known as:  FLOMAX     TAKE these medications   glycopyrrolate 1 MG tablet Commonly known as:  ROBINUL Take 1 tablet (1 mg total) by mouth 3 (three) times daily.   Hyoscyamine Sulfate SL 0.125 MG Subl Commonly known as:  LEVSIN/SL Place 0.125 mg under the tongue every 4 (four) hours as needed (for  secretions).   LORazepam 1 MG tablet Commonly known as:  ATIVAN Take 1 tablet (1 mg total) by mouth every 2 (two) hours as needed for anxiety or sleep.   morphine 20 MG/5ML solution Take 1.3 mLs (5.2 mg total) by mouth every 2 (two) hours as needed for pain.        DISCHARGE INSTRUCTIONS:   DIET:  Encourage fluids DISCHARGE CONDITION:  Serious ACTIVITY:  Activity as tolerated OXYGEN:  Home Oxygen: Yes.    Oxygen Delivery: 2 liters/min via Patient connected to nasal cannula  oxygen DISCHARGE LOCATION:  [facility] to follow   If you experience worsening of your admission symptoms, develop shortness of breath, life threatening emergency, suicidal or homicidal thoughts you must seek medical attention immediately by calling 911 or calling your MD immediately  if symptoms less severe.  You Must read complete instructions/literature along with all the possible adverse reactions/side effects for all the Medicines you take and that have been prescribed to you. Take any new Medicines after you have completely understood and accpet all the possible adverse reactions/side effects.   Please note  You were cared for by a hospitalist during your hospital stay. If you have any questions about your discharge medications or the care you received while you were in the hospital after you are discharged, you can call the unit and asked to speak with the hospitalist on call if the hospitalist that took care of you is not available. Once you are discharged, your primary care physician will handle any further medical issues. Please note that NO REFILLS for any discharge medications will be authorized once you are discharged, as it is imperative that you return to your primary care physician (or establish a relationship with a primary care physician if you do not have one) for your aftercare needs so that they can reassess your need for medications and monitor your lab values.    On the day of Discharge:  VITAL SIGNS:  Blood pressure (!) 175/84, pulse 88, temperature 99 F (37.2 C), temperature source Axillary, resp. rate 17, height 6' (1.829 m), weight 72.3 kg (159 lb 6.4 oz), SpO2 98 %. PHYSICAL EXAMINATION:  GENERAL:  77 y.o.-year-old patient lying in the bed with no acute distress.  EYES: Pupils equal, round, reactive to light and accommodation. No scleral icterus. Extraocular muscles intact.  HEENT: Head atraumatic, normocephalic. Oropharynx and nasopharynx clear.   NECK:  Supple, no jugular venous distention. No thyroid enlargement, no tenderness.  LUNGS: Normal breath sounds bilaterally, no wheezing, rales,rhonchi or crepitation. No use of accessory muscles of respiration.  CARDIOVASCULAR: S1, S2 normal. No murmurs, rubs, or gallops.  ABDOMEN: Soft, non-tender, non-distended. Bowel sounds present. No organomegaly or mass.  EXTREMITIES: No pedal edema, cyanosis, or clubbing.  NEUROLOGIC: Cranial nerves II through XII are intact. Muscle strength 5/5 in all extremities. Sensation intact. Gait not checked.  PSYCHIATRIC: The patient is comatose SKIN: No obvious rash, lesion, or ulcer.  DATA REVIEW:   CBC Recent Labs  Lab 02/25/17 0435  WBC 12.5*  HGB 10.1*  HCT 31.0*  PLT 129*    Chemistries  Recent Labs  Lab 02/24/17 0503 02/25/17 0435  NA 145 149*  K 3.4* 3.2*  CL 114* 116*  CO2 20* 22  GLUCOSE 107* 102*  BUN 53* 46*  CREATININE 2.14* 1.55*  CALCIUM 8.1* 8.7*  MG 1.7 2.0  AST 18  --   ALT 6*  --   ALKPHOS 85  --  BILITOT 1.1  --      Microbiology Results  Results for orders placed or performed during the hospital encounter of 02/23/17  Culture, blood (routine x 2)     Status: Abnormal (Preliminary result)   Collection Time: 02/23/17  4:49 AM  Result Value Ref Range Status   Specimen Description   Final    BLOOD LEFT FOREARM Performed at [facility], [address] [place], [place] [address]    Special Requests   Final    BOTTLES DRAWN AEROBIC AND ANAEROBIC Blood Culture adequate volume Performed at [facility], [address] [place], [place] [address]    Culture  Setup Time   Final    GRAM NEGATIVE RODS IN BOTH AEROBIC AND ANAEROBIC BOTTLES CRITICAL RESULT CALLED TO, READ BACK BY AND VERIFIED WITH: DAVID BESANTI AT 2250 02/23/17.PMH CRITICAL VALUE NOTED.  VALUE IS CONSISTENT WITH PREVIOUSLY REPORTED AND CALLED VALUE. Performed at [facility], [address] [place], [place] [address]     Culture PROTEUS MIRABILIS (A)  Final   Report Status PENDING  Incomplete  Culture, blood (routine x 2)     Status: Abnormal (Preliminary result)   Collection Time: 02/23/17  4:49 AM  Result Value Ref Range Status   Specimen Description   Final    BLOOD LEFT HAND Performed at [facility], [address] [place], [place] [address]    Special Requests   Final    BOTTLES DRAWN AEROBIC AND ANAEROBIC Blood Culture adequate volume Performed at [facility], [address] [place], [place] [address]    Culture  Setup Time   Final    GRAM NEGATIVE RODS IN BOTH AEROBIC AND ANAEROBIC BOTTLES CRITICAL RESULT CALLED TO, READ BACK BY AND VERIFIED WITH: DAVID BESANTI AT 2250 02/23/17.PMH    Culture (A)  Final    PROTEUS MIRABILIS SUSCEPTIBILITIES TO FOLLOW Performed at [facility], [address] [place], [place] [address]    Report Status PENDING  Incomplete  Urine culture     Status: Abnormal   Collection Time: 02/23/17  4:49 AM  Result Value Ref Range Status   Specimen Description   Final    URINE, RANDOM Performed at [facility], [address] [place], [place] [address]    Special Requests   Final    NONE Performed at [facility], [address] [place], [place] [address]    Culture >=100,000 COLONIES/mL PROTEUS MIRABILIS (A)  Final   Report Status 02/25/2017 FINAL  Final   Organism ID, Bacteria PROTEUS MIRABILIS (A)  Final      Susceptibility   Proteus mirabilis - MIC*    AMPICILLIN <=2 SENSITIVE Sensitive     CEFAZOLIN 8 SENSITIVE Sensitive     CEFTRIAXONE <=1 SENSITIVE Sensitive     CIPROFLOXACIN <=0.25 SENSITIVE Sensitive     GENTAMICIN <=1 SENSITIVE Sensitive     IMIPENEM 4 SENSITIVE Sensitive     NITROFURANTOIN 128 RESISTANT Resistant     TRIMETH/SULFA <=20 SENSITIVE Sensitive     AMPICILLIN/SULBACTAM <=2 SENSITIVE Sensitive     PIP/TAZO <=4 SENSITIVE Sensitive     * >=100,000 COLONIES/mL PROTEUS MIRABILIS  Blood  Culture ID Panel (Reflexed)     Status: Abnormal   Collection Time: 02/23/17  4:49 AM  Result Value Ref Range Status   Enterococcus species NOT DETECTED NOT DETECTED Final   Listeria monocytogenes NOT DETECTED NOT DETECTED Final   Staphylococcus species NOT DETECTED NOT DETECTED  Final   Staphylococcus aureus NOT DETECTED NOT DETECTED Final   Streptococcus species NOT DETECTED NOT DETECTED Final   Streptococcus agalactiae NOT DETECTED NOT DETECTED Final   Streptococcus pneumoniae NOT DETECTED NOT DETECTED Final   Streptococcus pyogenes NOT DETECTED NOT DETECTED Final   Acinetobacter baumannii NOT DETECTED NOT DETECTED Final   Enterobacteriaceae species DETECTED (A) NOT DETECTED Final    Comment: Enterobacteriaceae represent a large family of gram-negative bacteria, not a single organism. CRITICAL RESULT CALLED TO, READ BACK BY AND VERIFIED WITH: DAVID BESANTI AT 2250 02/23/17.PMH    Enterobacter cloacae complex NOT DETECTED NOT DETECTED Final   Escherichia coli NOT DETECTED NOT DETECTED Final   Klebsiella oxytoca NOT DETECTED NOT DETECTED Final   Klebsiella pneumoniae NOT DETECTED NOT DETECTED Final   Proteus species DETECTED (A) NOT DETECTED Final    Comment: CRITICAL RESULT CALLED TO, READ BACK BY AND VERIFIED WITH: DAVID BESANTI AT 2250 02/23/17.PMH    Serratia marcescens NOT DETECTED NOT DETECTED Final   Carbapenem resistance NOT DETECTED NOT DETECTED Final   Haemophilus influenzae NOT DETECTED NOT DETECTED Final   Neisseria meningitidis NOT DETECTED NOT DETECTED Final   Pseudomonas aeruginosa NOT DETECTED NOT DETECTED Final   Candida albicans NOT DETECTED NOT DETECTED Final   Candida glabrata NOT DETECTED NOT DETECTED Final   Candida krusei NOT DETECTED NOT DETECTED Final   Candida parapsilosis NOT DETECTED NOT DETECTED Final   Candida tropicalis NOT DETECTED NOT DETECTED Final    Comment: Performed at [facility], [address] [place], [place] [address]  MRSA PCR  Screening     Status: None   Collection Time: 02/23/17  9:01 AM  Result Value Ref Range Status   MRSA by PCR NEGATIVE NEGATIVE Final    Comment:        The GeneXpert MRSA Assay (FDA approved for NASAL specimens only), is one component of a comprehensive MRSA colonization surveillance program. It is not intended to diagnose MRSA infection nor to guide or monitor treatment for MRSA infections. Performed at [facility], [address] [place], [place] [address]     I  Management plans discussed with the patient, family (talked with Niece Vernona Rieger at bedside) and they are in agreement.  CODE STATUS: DNR , comfort care  TOTAL TIME TAKING CARE OF THIS PATIENT: 45 minutes.    Delfino Lovett M.D on 02/26/2017 at 8:43 AM  Between 7am to 6pm - Pager - [phone]  After 6pm go to [email or web address] - password [email or web address] [facility]  [facility] [place] [facility]  Office  [phone]  CC: Primary care physician; Joaquim Nam, MD   Note: This dictation was prepared with Dragon dictation along with smaller phrase technology. Any transcriptional errors that result from this process are unintentional.

## 2017-02-25 NOTE — Progress Notes (Signed)
New referral for [facility] at [facility] received from [facility] following a Palliative Medicine consult. Patient is a 77 year old man with a history of Parkinson's disease, HTN, BPH, HLD and GERD, [facility] on [facility]20 for treatment of a urinary tract infection. In the ED he was found to be in septic shock with acute kidney injury. He has been treated with IV fluids and IV antibiotics but has continued to decline, not eating or taking oral medications due to increased lethargy. Palliative Medicine was consuilted for goals of care and have spoken to family, who have chosen  to focus on comfort with the support of hospice services. Patient seen lying in bed, did not respond to voice, tremors noted during the visit. Per report of staff patient took a bite of food yesterday, and has not eaten today. Writer spoke on the phone and then again in person with patient's sister in law Sharen HintBecky Smith as well as other family members in the room to initiate education regarding hospice services, philosophy and team approach to care with good understanding voiced. Of note patient [facility] a room at [facility] with his wife Joyce GrossKay and family feels that he should return to be with her.Hospice information and contact number given to [facility]. Plan is for discharge back to [facility] tomorrow via EMS. Patient information faxed to referral. Hospital care team all updated and aware of plan. Signed DNR in place iin patient's chart. Will continue to follow through final disposition. Dayna BarkerKaren Robertson RN, BSN, [facility], hospital Liaison [phone]

## 2017-02-25 NOTE — Progress Notes (Addendum)
1        [employer] - [place] at [facility]   PATIENT NAME: Hunter Willis    MR#:  [id number]  DATE OF BIRTH:  1940/10/02  SUBJECTIVE:  CHIEF COMPLAINT:   Chief Complaint  Patient presents with  . Altered Mental Status  Confused, at baseline.  Family at bedside REVIEW OF SYSTEMS:  Review of Systems  Unable to perform ROS: Critical illness   DRUG ALLERGIES:  No Known Allergies VITALS:  Blood pressure (!) 158/60, pulse 80, temperature 99.9 F (37.7 C), temperature source Oral, resp. rate (!) 21, height 6' (1.829 m), weight 73.3 kg (161 lb 9.6 oz), SpO2 95 %. PHYSICAL EXAMINATION:  Physical Exam  Constitutional: He appears malnourished and dehydrated. He appears unhealthy. He appears cachectic. He appears toxic. He has a sickly appearance.  HENT:  Head: Normocephalic and atraumatic.  Eyes: Conjunctivae and EOM are normal. Pupils are equal, round, and reactive to light.  Neck: Normal range of motion. Neck supple. No tracheal deviation present. No thyromegaly present.  Cardiovascular: Normal rate, regular rhythm and normal heart sounds.  Pulmonary/Chest: Effort normal and breath sounds normal. No respiratory distress. He has no wheezes. He exhibits no tenderness.  Abdominal: Soft. Bowel sounds are normal. He exhibits no distension. There is no tenderness.  Genitourinary:  Genitourinary Comments: Foley catheter in place  Musculoskeletal: Normal range of motion.  Neurological: He is alert. He is disoriented. No cranial nerve deficit.  Skin: Skin is warm and dry. No rash noted.  Psychiatric: His affect is blunt.   LABORATORY PANEL:  Male CBC Recent Labs  Lab 02/25/17 0435  WBC 12.5*  HGB 10.1*  HCT 31.0*  PLT 129*   ------------------------------------------------------------------------------------------------------------------ Chemistries  Recent Labs  Lab 02/24/17 0503 02/25/17 0435  NA 145 149*  K 3.4* 3.2*  CL 114* 116*  CO2 20* 22  GLUCOSE  107* 102*  BUN 53* 46*  CREATININE 2.14* 1.55*  CALCIUM 8.1* 8.7*  MG 1.7 2.0  AST 18  --   ALT 6*  --   ALKPHOS 85  --   BILITOT 1.1  --    RADIOLOGY:  No results found. ASSESSMENT AND PLAN:  89 M SNF resident with severe chronic debilitation due to advanced Parkinson's and dementia.  At his baseline, he is bed bound, has chronic Foley catheter, requires assistance with feeding and is very poorly oriented, not recognizing family members consistently.  He was admitted for severe sepsis/septic shock due to urinary tract infection likely caused by malpositioned Foley catheter.  * Septic shock secondary to chronic Foley catheter related UTI with urinary retention -This was bladder outlet obstruction due to Foley malposition -Foley replaced -Continue IV Rocephin -Blood culture is growing Proteus so is urine culture   *Acute kidney injury due to urinary retention and septic shock.  ATN and post obstructive -Creatinine improving status post change in Foley from 5.06->2.14->1.55  *Urethral stones - There are 2 stones within urethra which, if catheter is properly replaced do not currently need mgmt for urology. - - Stone debris in bladder as well. Bilateral renal stones, R>L. No ureteral stones or hydronephrosis. -Foley catheter changed while in ICU  *Acute toxic and metabolic encephalopathy with baseline dementia.  CT head shows nothing acute. Due to acute kidney injury and sepsis.  Monitor.    Plan is to discharge him tomorrow to [place] [facility] with hospice services   All the records are reviewed and case discussed with Care Management/Social Worker. Management plans  discussed with the patient, family at bedside and they are in agreement.  CODE STATUS: DNR  TOTAL TIME TAKING CARE OF THIS PATIENT: 35 minutes.   More than 50% of the time was spent in counseling/coordination of care: YES  POSSIBLE D/C IN 1 DAYS, DEPENDING ON CLINICAL CONDITION.   Delfino LovettVipul Marina Boerner M.D on  02/25/2017 at 3:21 PM  Between 7am to 6pm - Pager - [phone]  After 6pm go to [email or web address] - password [email or web address] [facility]  [employer] [place] [facility]  Office  [phone]  CC: Primary care physician; Joaquim Namuncan, Graham S, MD  Note: This dictation was prepared with Dragon dictation along with smaller phrase technology. Any transcriptional errors that result from this process are unintentional.

## 2017-02-25 NOTE — Progress Notes (Signed)
Family Meeting Note  Advance Directive:yes  Today a meeting took place with the Patient and Other family at bedside.  Patient is unable to participate due NF:AOZHYQto:Lacked capacity Demented   The following clinical team members were present during this meeting:MD  The following were discussed:Patient's diagnosis:   77 year old man with a history of Parkinson's disease, HTN, BPH, HLD and GERD, [facility]20 for treatment of a urinary tract infection.  He is admitted for septic shock with acute kidney injury  Patient's progosis: < 6 weeks and Goals for treatment: DNR  Additional follow-up to be provided: [place] [facility] with hospice services per family wishes  Time spent during discussion:20 minutes  Delfino LovettVipul Jaala Bohle, MD

## 2017-02-25 NOTE — Progress Notes (Signed)
Daily Progress Note   Patient Name: Hunter Willis       Date: 02/25/2017 DOB: [birth year]  Age: 77 y.o. MRN#: [id number] Attending Physician: Hunter Lovett, MD Primary Care Physician: Hunter Nam, MD Admit Date: 02/23/2017  Reason for Consultation/Follow-up: Establishing goals of care  Subjective: Hunter Willis is resting in bed with eyes closed. Per nursing, he has slept since moving to unit and has only ate 1 bite yesterday and nothing today. He will not open his mouth for swabs. SW and hospice working on discharge planning with hospice. He is appropriate for inpatient hospice facility, but may be transferred back to his nursing facility with hospice if that is an option.  Updated Hunter Willis on status.   Length of Stay: 2  Current Medications: Scheduled Meds:  . chlorhexidine  15 mL Mouth Rinse BID  . heparin  5,000 Units Subcutaneous Q8H  . mouth rinse  15 mL Mouth Rinse q12n4p  . multivitamin with minerals  1 tablet Oral Daily  . pantoprazole (PROTONIX) IV  40 mg Intravenous Q24H  . pneumococcal 23 valent vaccine  0.5 mL Intramuscular Tomorrow-1000  . sodium chloride flush  3 mL Intravenous Q12H    Continuous Infusions: . cefTRIAXone (ROCEPHIN)  IV Stopped (02/24/17 1827)    PRN Meds: acetaminophen **OR** acetaminophen, albuterol, bisacodyl, [DISCONTINUED] ondansetron **OR** ondansetron (ZOFRAN) IV, polyethylene glycol  Physical Exam  Constitutional: No distress.  HENT:  Head: Normocephalic.  Pulmonary/Chest: Effort normal.  Skin: Skin is warm and dry.            Vital Signs: BP (!) 146/68 (BP Location: Left Leg)   Pulse 80   Temp (!) 101.3 F (38.5 C) (Oral)   Resp (!) 21   Ht 6' (1.829 m)   Wt 73.3 kg (161 lb 9.6 oz)   SpO2 100%   BMI 21.92 kg/m  SpO2: SpO2: 100  % O2 Device: O2 Device: Not Delivered O2 Flow Rate: O2 Flow Rate (L/min): 2 L/min  Intake/output summary:   Intake/Output Summary (Last 24 hours) at 02/25/2017 1052 Last data filed at 02/25/2017 1033 Gross per 24 hour  Intake 280.5 ml  Output 1100 ml  Net -819.5 ml   LBM: Last BM Date: 02/24/17 Baseline Weight: Weight: 65.8 kg (145 lb) Most recent weight: Weight: 73.3 kg (161 lb 9.6 oz)  Palliative Assessment/Data: 10%      Patient Active Problem List   Diagnosis Date Noted  . Pressure injury of skin 02/24/2017  . Septic shock (HCC) 02/23/2017  . Encounter for chronic pain management 11/04/2016  . Hospice care patient 02/13/2016  . FTT (failure to thrive) in adult 08/25/2015  . Has a tremor 07/06/2015  . HLD (hyperlipidemia) 07/06/2015  . Fall 05/28/2015  . Carotid artery calcification 02/08/2015  . Constipation 02/08/2015  . Lower urinary tract symptoms (LUTS) 12/01/2014  . Pressure ulcer 11/25/2014  . Neck pain 10/14/2014  . Excessive salivation 10/21/2013  . Advance care planning 09/05/2013  . Closed hip fracture (HCC) 05/31/2013  . Femoral neck fracture (HCC) 05/30/2013  . BP (high blood pressure) 05/03/2013  . Blood glucose elevated 05/03/2013  . Acid reflux 05/03/2013  . Benign essential tremor 05/03/2013  . Hyperglycemia 08/10/2012  . Hyperlipidemia 05/31/2011  . Medicare annual wellness visit, subsequent 05/31/2011  . Back pain 01/04/2011  . Parkinson's disease (HCC) 05/21/2010  . UNSPECIFIED VITAMIN D DEFICIENCY 08/24/2008  . Essential hypertension 07/23/2006  . Allergic rhinitis 07/23/2006  . EROSIVE ESOPHAGITIS 07/23/2006  . GERD 07/23/2006  . RENAL CALCULUS, RECURRENT 07/23/2006  . BPH (benign prostatic hyperplasia) 07/23/2006    Palliative Care Assessment & Plan   Patient Profile:  Hunter Willis  is a 77 y.o. male with a known history of Parkinson's, BPH, mild dementia, chronic Foley catheter in place.  Assessment/  Recommendations/Plan:  Hospice discharge.   Code Status:    Code Status Orders  (From admission, onward)        Start     Ordered   02/23/17 1016  Do not attempt resuscitation (DNR)  Continuous    Question Answer Comment  In the event of cardiac or respiratory ARREST Do not call a "code blue"   In the event of cardiac or respiratory ARREST Do not perform Intubation, CPR, defibrillation or ACLS   In the event of cardiac or respiratory ARREST Use medication by any route, position, wound care, and other measures to relive pain and suffering. May use oxygen, suction and manual treatment of airway obstruction as needed for comfort.      02/23/17 1015    Code Status History    Date Active Date Inactive Code Status Order ID Comments User Context   02/23/2017 07:50 02/23/2017 10:15 Full Code [id number]  Milagros LollSudini, Srikar, MD ED   11/23/2014 20:32 11/25/2014 15:33 Full Code [id number]  Milagros LollSudini, Srikar, MD ED   05/31/2013 21:52 06/02/2013 18:16 Full Code [id number]  Marilynne DriversChabon, Stephen J, PA-C Inpatient   05/30/2013 19:14 05/31/2013 21:52 Full Code [id number]  Zannie CoveJoseph, Preetha, MD Inpatient       Prognosis:   < 2 weeks  Discharge Planning:  Hospice  Care plan was discussed with family, Dr. Sherryll BurgerShah, SW and hospice.   Thank you for allowing the Palliative Medicine Team to assist in the care of this patient.   Total Time 35 min Prolonged Time Billed No      Greater than 50%  of this time was spent counseling and coordinating care related to the above assessment and plan.  Morton Stallrystal Hunter Brayboy, NP  Please contact Palliative Medicine Team phone at [phone] for questions and concerns.

## 2017-02-26 LAB — CULTURE, BLOOD (ROUTINE X 2)
SPECIAL REQUESTS: ADEQUATE
Special Requests: ADEQUATE

## 2017-02-26 MED ORDER — MORPHINE SULFATE (PF) 2 MG/ML IV SOLN
1.0000 mg | INTRAVENOUS | Status: DC | PRN
  Administered 2017-02-26: 02:00:00 1 mg via INTRAVENOUS
  Filled 2017-02-26: qty 1

## 2017-02-26 MED ORDER — LORAZEPAM 2 MG/ML IJ SOLN: 1.0000 mg | INTRAMUSCULAR | Status: DC | PRN

## 2017-02-26 MED ORDER — LORAZEPAM 1 MG PO TABS: 1.0000 mg | ORAL_TABLET | ORAL | 0 refills | Status: AC | PRN

## 2017-02-26 MED ORDER — MORPHINE SULFATE 20 MG/5ML PO SOLN: 5.0000 mg | ORAL | 0 refills | Status: AC | PRN

## 2017-02-26 NOTE — Progress Notes (Signed)
Family Meeting Note  Advance Directive:yes  Today a meeting took place with the Patient and Niece.  Patient is unable to participate due LK:GMWNUUto:Lacked capacity Comatose   The following clinical team members were present during this meeting:MD  The following were discussed:Patient's diagnosis:   77 year old man with a history of Parkinson's disease, HTN, BPH, HLD and GERD, [facility] on 1/20 for treatment of a urinary tract infection.  He is admitted for septic shock with acute kidney injury. He's hypernatremia is getting worse and still running fever. I've had d/w family not to bother about any numbers as goal is to keep him comfortable and let him be at peace. Avoid Readmission back to Hospital.  Patient's progosis: < 2 weeks and Goals for treatment: DNR  Additional follow-up to be provided: [facility] with Hospice. He seem to be dying and declining rapidly. Family and patient's wishes are [facility] if he's passing so will release him to be with her at same facility.  Time spent during discussion:20 minutes  Delfino LovettVipul Eastin Swing, MD

## 2017-02-26 NOTE — Progress Notes (Signed)
Patient is medically stable for D/C back to [facility] long term care [facility]. Per [facility] admissions coordinator at [facility] patient can return today. RN will call report and arrange EMS for transport. RN has agreed to contact patient's sister in laws [place] and [place] when EMS arrives. Clinical Child psychotherapistocial Worker (CSW) sent D/C orders to [facility] via [facility]. [facility] is aware of above. CSW contacted patient's sister in law [place] and made her aware of above. Please reconsult if future social work needs arise. CSW signing off.   [employer] Inc[employer]Bailey Josslyn Ciolek, LCSW [phone]

## 2017-02-26 NOTE — Plan of Care (Signed)
Called report to [place] at [facility].  Pt d/cing back and followed by Hospice.  Pt has been nonresponsive this am.  Unable to give any medication.  Pt will transport via EMS.

## 2017-02-26 NOTE — Progress Notes (Signed)
Follow up visit made to new referral for [facility] services at [facility]. Patient seen lying in bed, eyes closed, no verbal response to voice or gentle touch. Tremors noted when spoken to. Patient is not eating. Drinking or taking oral medications. Low grade fever noted early this morning. No family present at the time of this visit. Plan remains for discharge back to [facility] today. Hospital care team all aware.Updated notes faxed to referral. Dayna BarkerKaren Robertson RN, BSN, [facility], hospital liaison [phone]

## 2017-02-26 NOTE — Care Management Important Message (Deleted)
Important Message  Patient Details  Name: Lyn RecordsJerry D Parcell MRN: [id number] Date of Birth: 10/15/1940   Medicare Important Message Given:      Gwenette GreetBrenda S Queenie Aufiero, RN 02/26/2017, 8:49 AM

## 2017-02-26 NOTE — Discharge Instructions (Signed)
Hospice Introduction Hospice is a service that is designed to provide people who are terminally ill and their families with medical, spiritual, and psychological support. Its aim is to improve your quality of life by keeping you as alert and comfortable as possible. Who will be my providers when I begin hospice care? Hospice teams often include:  A nurse.  A doctor. The hospice doctor will be available for your care, but you can bring your regular doctor or nurse practitioner.  Social workers.  Religious leaders (such as a Clinical biochemist).  Trained volunteers.  What roles will providers play in my care? Hospice is performed by a team of health care professionals and volunteers who:  Help keep you comfortable: ? Hospice can be provided in your home or in a homelike setting. ? The hospice staff works with your family and friends to help meet your needs. ? You will enjoy the support of loved ones by receiving much of your basic care from family and friends.  Provide pain relief and manage your symptoms. The staff supply all necessary medicines and equipment.  Provide companionship when you are alone.  Allow you and your family to rest. They may do light housekeeping, prepare meals, and run errands.  Provide counseling. They will make sure your emotional, spiritual, and social needs and those of your family are being met.  Provide spiritual care: ? Spiritual care will be individualized to meet your needs and your family's needs. ? Spiritual care may involve:  Helping you look at what death means to you.  Helping you say goodbye to your family and friends.  Performing a specific religious ceremony or ritual.  When should hospice care begin? Most people who use hospice are believed to have fewer than 6 months to live.  Your family and health care providers can help you decide when hospice services should begin.  If your condition improves, you may discontinue the program.  What  should I consider before selecting a program? Most hospice programs are run by nonprofit, independent organizations. Some are affiliated with hospitals, nursing homes, or home health care agencies. Hospice programs can take place in the home or at a hospice center, hospital, or skilled nursing facility. When choosing a hospice program, ask the following questions:  What services are available to me?  What services will be offered to my loved ones?  How involved will my loved ones be?  How involved will my health care provider be?  Who makes up the hospice care team? How are they trained or screened?  How will my pain and symptoms be managed?  If my circumstances change, can the services be provided in a different setting, such as my home or in the hospital?  Is the program reviewed and licensed by the state or certified in some other way?  Where can I learn more about hospice? You can learn about existing hospice programs in your area from your health care providers. You can also read more about hospice online. The websites of the following organizations contain helpful information:  The [facility] and Palliative Care Organization [facility]).  The Hospice Association of America ([place]).  The [place].  The American Cancer Society (ACS).  Hospice Net.  This information is not intended to replace advice given to you by your health care provider. Make sure you discuss any questions you have with your health care provider. Document Released: 05/10/2003 Document Revised: 09/07/2015 Document Reviewed: 12/01/2012 Elsevier Interactive Patient Education  2017 [employer].

## 2017-03-07 DEATH — deceased

## 2017-07-19 IMAGING — MR MR HEAD W/O CM
9 of 10 series · 40 of 48 positions shown · non-contrast
Comparison: CT head 12/10/2013

CLINICAL DATA: TIA.  Bilateral vision change

EXAM:
MRI HEAD WITHOUT CONTRAST
TECHNIQUE: Multiplanar, multiecho pulse sequences of the brain and surrounding
structures were obtained without intravenous contrast.

[Series 2: T1 · sagittal · 5.0mm · 0.36mm/px · 2 of 25 slices shown]
[im 1/25]
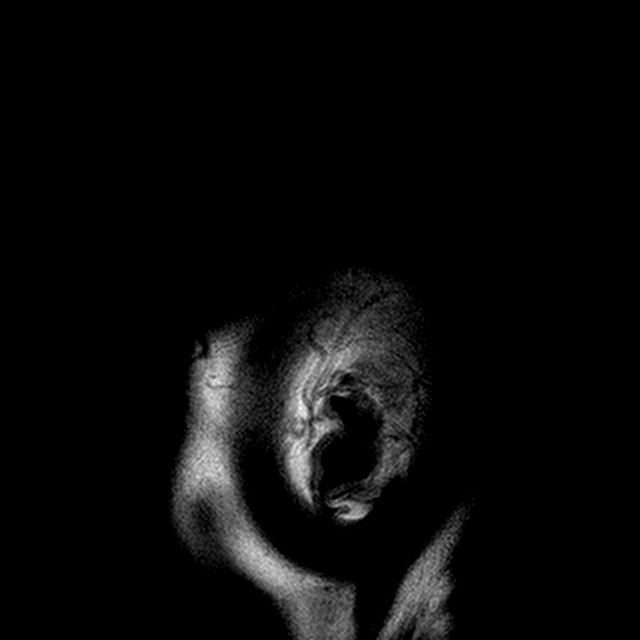
[im 25/25]
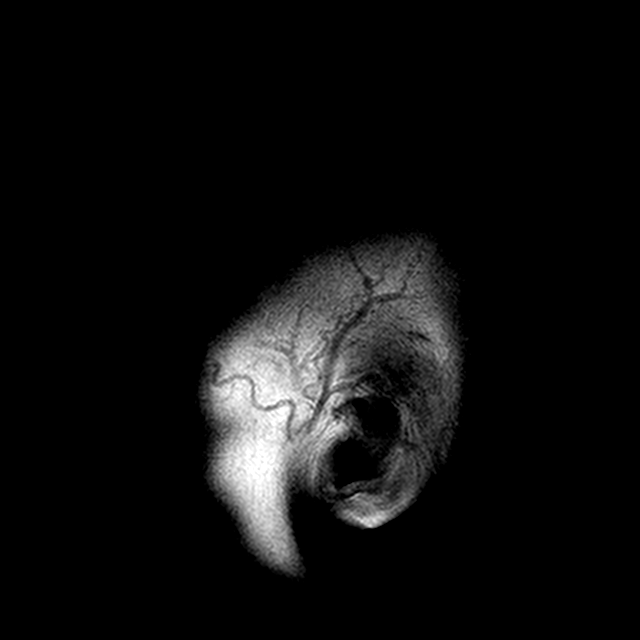

[Series 4: DWI · axial · 4.0mm · 0.94mm/px · z∈[-126,+37]mm · 6 of 43 slices shown (1 of 4)]
[im 1/43]
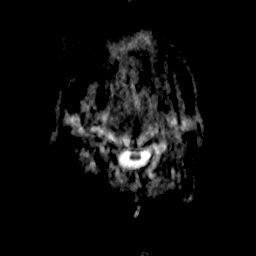
[im 9/43]
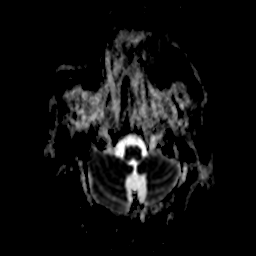
[im 17/43]
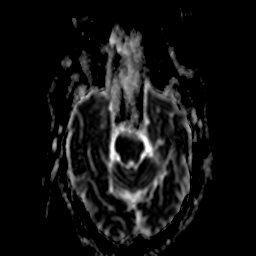
[im 26/43]
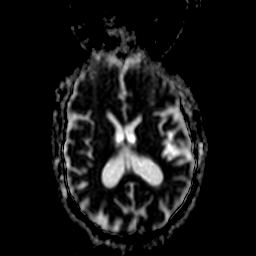
[im 34/43]
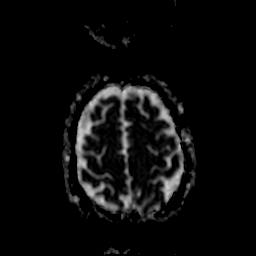
[im 43/43]
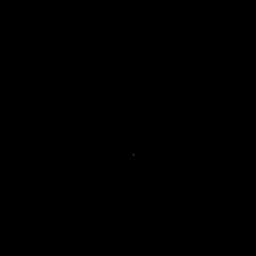

[Series 5: DWI · axial · 4.0mm · 0.94mm/px · z∈[-126,+33]mm · 6 of 42 slices shown (2 of 4)]
[im 1/42]
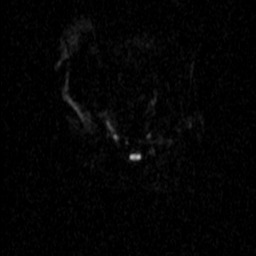
[im 9/42]
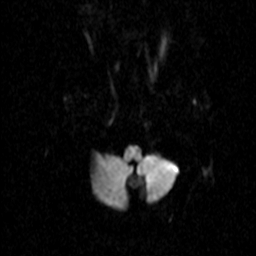
[im 17/42]
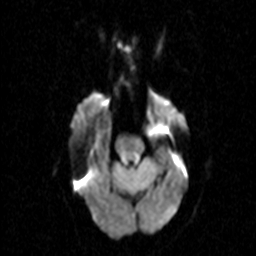
[im 25/42]
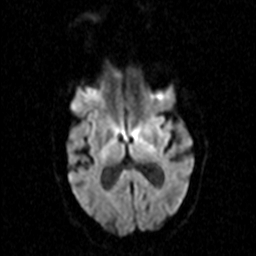
[im 33/42]
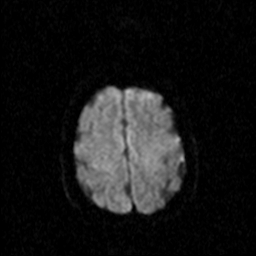
[im 42/42]
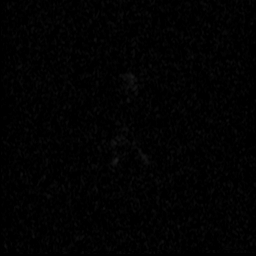

[Series 7: DWI · coronal · 5.0mm · 0.94mm/px · 5 of 38 slices shown (3 of 4)]
[im 1/38]
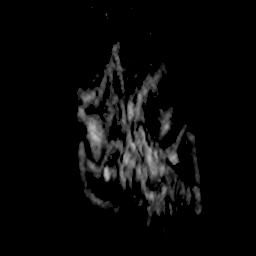
[im 10/38]
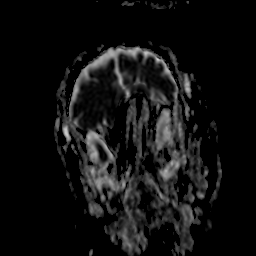
[im 19/38]
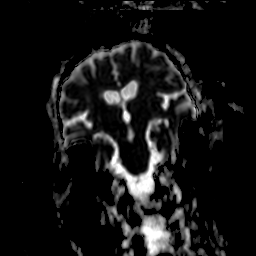
[im 28/38]
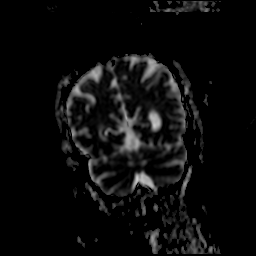
[im 38/38]
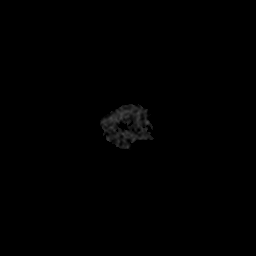

[Series 8: DWI · coronal · 5.0mm · 0.94mm/px · 5 of 39 slices shown (4 of 4)]
[im 1/39]
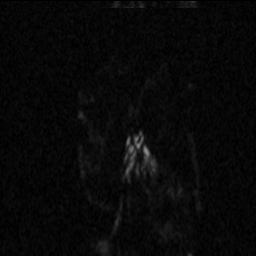
[im 10/39]
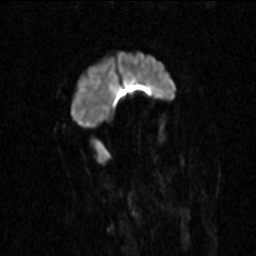
[im 20/39]
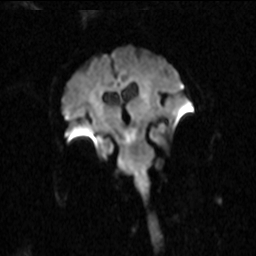
[im 29/39]
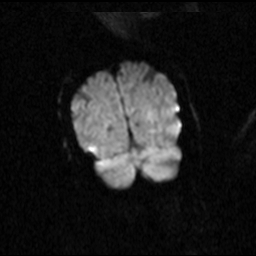
[im 39/39]
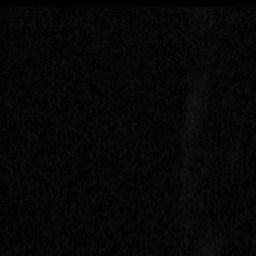

[Series 9: T2 · axial · 5.0mm · 0.68mm/px · z∈[-128,+28]mm · 4 of 26 slices shown (1 of 3)]
[im 1/26]
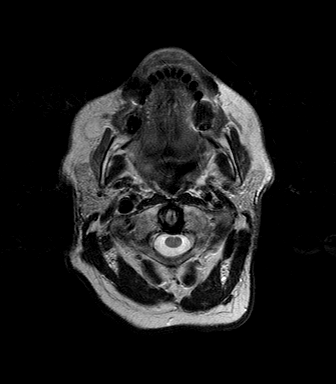
[im 9/26]
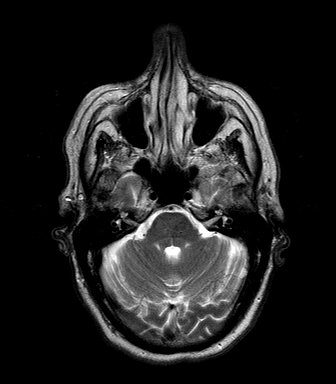
[im 17/26]
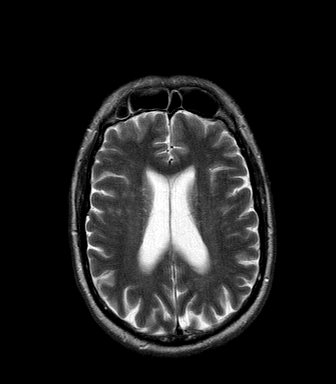
[im 26/26]
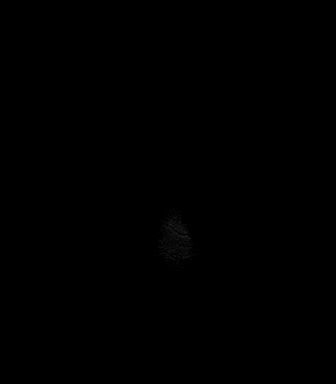

[Series 11: T2 · axial · 5.0mm · 0.45mm/px · z∈[-122,+33]mm · 4 of 26 slices shown (2 of 3)]
[im 1/26]
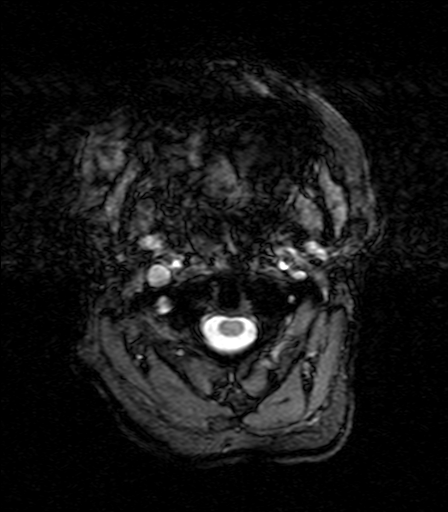
[im 9/26]
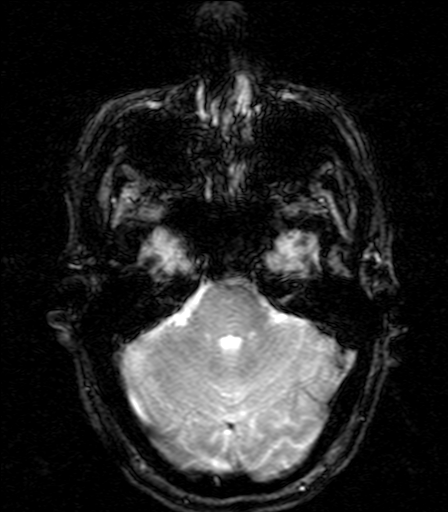
[im 17/26]
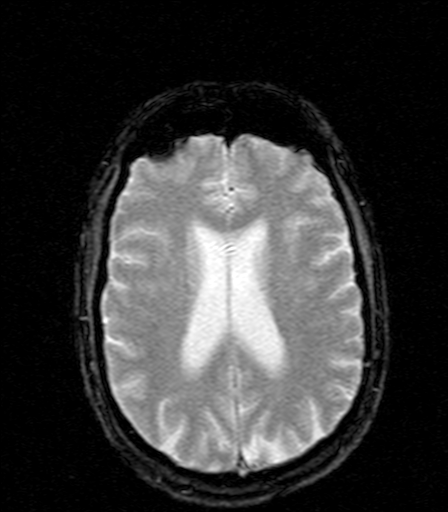
[im 26/26]
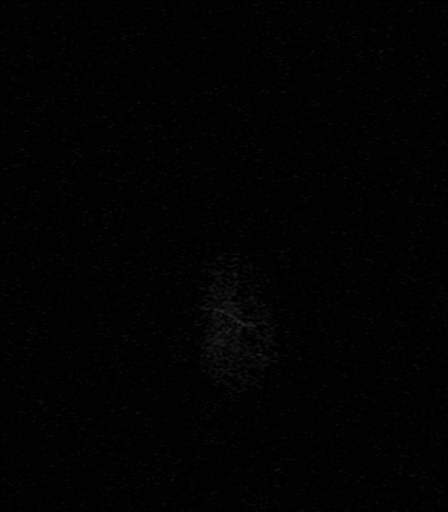

[Series 12: FLAIR · axial · 5.0mm · 0.90mm/px · z∈[-119,+37]mm · 4 of 26 slices shown]
[im 1/26]
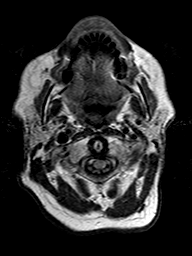
[im 9/26]
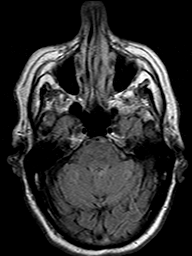
[im 17/26]
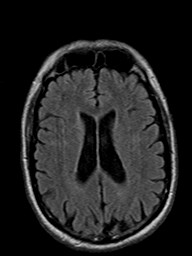
[im 26/26]
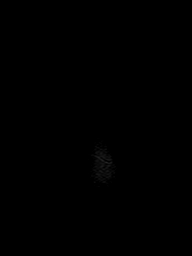

[Series 13: T2 · coronal · 5.0mm · 0.51mm/px · 4 of 29 slices shown (3 of 3)]
[im 1/29]
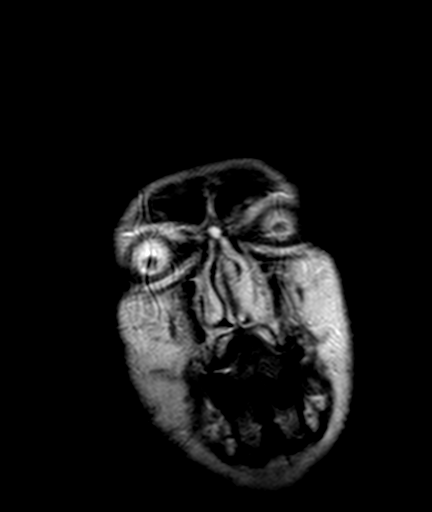
[im 10/29]
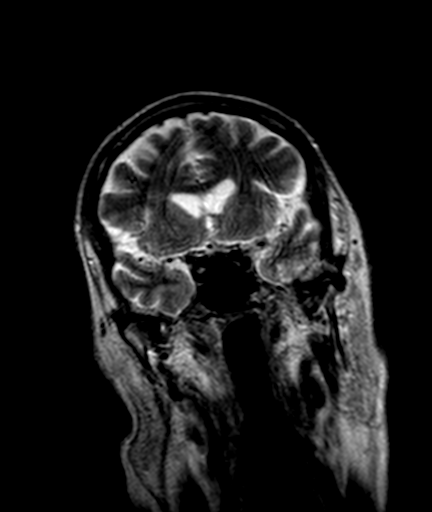
[im 19/29]
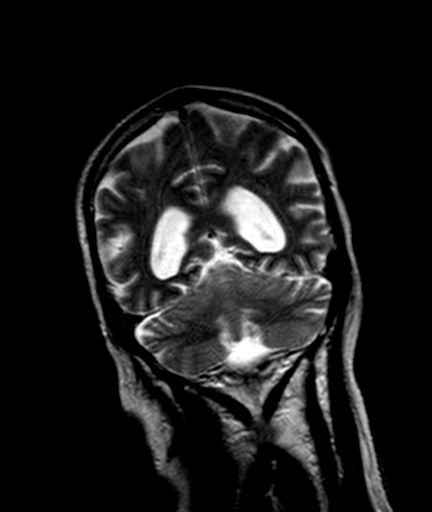
[im 29/29]
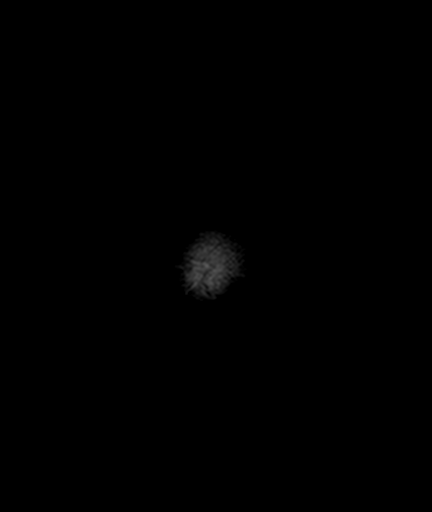

[40 of 48 positions shown; findings below may reference images not displayed]

FINDINGS: Fast scanning technique due to tremor from Parkinson's disease.
Image quality degraded by mild motion.

Ventricle size is normal.  Mild atrophy, typical for age.

Negative for acute infarct. Slight chronic microvascular ischemic
change in the white matter. Brainstem and cerebellum normal

Negative for hemorrhage or mass lesion.  No edema in the brain.

Mild mucosal edema in the paranasal sinuses without air-fluid level.
IMPRESSION: No acute intracranial abnormality. B mild chronic microvascular
ischemic change in the white matter, less than expected for age.

Mild chronic sinusitis.
